# Patient Record
Sex: Female | Born: 1945 | Race: Black or African American | Hispanic: No | Marital: Single | Smoking: Never smoker
Health system: Southern US, Community
[De-identification: ages and names within clinical notes are randomized; demographics above are authoritative.]

## PROBLEM LIST (undated history)

## (undated) DIAGNOSIS — I2102 ST elevation (STEMI) myocardial infarction involving left anterior descending coronary artery: Secondary | ICD-10-CM

## (undated) DIAGNOSIS — I480 Paroxysmal atrial fibrillation: Secondary | ICD-10-CM

## (undated) DIAGNOSIS — I639 Cerebral infarction, unspecified: Secondary | ICD-10-CM

## (undated) DIAGNOSIS — E119 Type 2 diabetes mellitus without complications: Secondary | ICD-10-CM

## (undated) DIAGNOSIS — I25119 Atherosclerotic heart disease of native coronary artery with unspecified angina pectoris: Secondary | ICD-10-CM

## (undated) DIAGNOSIS — Z955 Presence of coronary angioplasty implant and graft: Secondary | ICD-10-CM

## (undated) HISTORY — DX: Cerebral infarction, unspecified: I63.9

## (undated) HISTORY — DX: ST elevation (STEMI) myocardial infarction involving left anterior descending coronary artery: I21.02

---

## 2014-06-07 ENCOUNTER — Emergency Department (INDEPENDENT_AMBULATORY_CARE_PROVIDER_SITE_OTHER)
Admission: EM | Admit: 2014-06-07 | Discharge: 2014-06-07 | Disposition: A | Discharge status: Home / Self Care | Payer: Medicare Other | Source: Home / Self Care | Attending: Family Medicine | Admitting: Family Medicine

## 2014-06-07 ENCOUNTER — Encounter (HOSPITAL_COMMUNITY): Payer: Self-pay | Admitting: Emergency Medicine

## 2014-06-07 DIAGNOSIS — I10 Essential (primary) hypertension: Secondary | ICD-10-CM

## 2014-06-07 DIAGNOSIS — G44209 Tension-type headache, unspecified, not intractable: Secondary | ICD-10-CM

## 2014-06-07 HISTORY — DX: Type 2 diabetes mellitus without complications: E11.9

## 2014-06-07 MED ORDER — IBUPROFEN 800 MG PO TABS
800.0000 mg | ORAL_TABLET | Freq: Once | ORAL | Status: AC
  Administered 2014-06-07: 800 mg via ORAL

## 2014-06-07 MED ORDER — IBUPROFEN 800 MG PO TABS
ORAL_TABLET | ORAL | Status: AC
  Filled 2014-06-07: qty 1

## 2014-06-07 MED ORDER — IBUPROFEN 800 MG PO TABS: 800.0000 mg | ORAL_TABLET | Freq: Three times a day (TID) | ORAL | Status: DC | PRN

## 2014-06-07 NOTE — ED Notes (Signed)
Patient checks blood pressure every morning, checked bp this am and noted it to be higher than usual.  Patient also reports waking with a headache

## 2014-06-07 NOTE — Discharge Instructions (Signed)
Tension Headache A tension headache is a feeling of pain, pressure, or aching often felt over the front and sides of the head. The pain can be dull or can feel tight (constricting). It is the most common type of headache. Tension headaches are not normally associated with nausea or vomiting and do not get worse with physical activity. Tension headaches can last 30 minutes to several days.  CAUSES  The exact cause is not known, but it may be caused by chemicals and hormones in the brain that lead to pain. Tension headaches often begin after stress, anxiety, or depression. Other triggers may include:  Alcohol.  Caffeine (too much or withdrawal).  Respiratory infections (colds, flu, sinus infections).  Dental problems or teeth clenching.  Fatigue.  Holding your head and neck in one position too long while using a computer. SYMPTOMS   Pressure around the head.   Dull, aching head pain.   Pain felt over the front and sides of the head.   Tenderness in the muscles of the head, neck, and shoulders. DIAGNOSIS  A tension headache is often diagnosed based on:   Symptoms.   Physical examination.   A CT scan or MRI of your head. These tests may be ordered if symptoms are severe or unusual. TREATMENT  Medicines may be given to help relieve symptoms.  HOME CARE INSTRUCTIONS   Only take over-the-counter or prescription medicines for pain or discomfort as directed by your caregiver.   Lie down in a dark, quiet room when you have a headache.   Keep a journal to find out what may be triggering your headaches. For example, write down:  What you eat and drink.  How much sleep you get.  Any change to your diet or medicines.  Try massage or other relaxation techniques.   Ice packs or heat applied to the head and neck can be used. Use these 3 to 4 times per day for 15 to 20 minutes each time, or as needed.   Limit stress.   Sit up straight, and do not tense your muscles.    Quit smoking if you smoke.  Limit alcohol use.  Decrease the amount of caffeine you drink, or stop drinking caffeine.  Eat and exercise regularly.  Get 7 to 9 hours of sleep, or as recommended by your caregiver.  Avoid excessive use of pain medicine as recurrent headaches can occur.  SEEK MEDICAL CARE IF:   You have problems with the medicines you were prescribed.  Your medicines do not work.  You have a change from the usual headache.  You have nausea or vomiting. SEEK IMMEDIATE MEDICAL CARE IF:   Your headache becomes severe.  You have a fever.  You have a stiff neck.  You have loss of vision.  You have muscular weakness or loss of muscle control.  You lose your balance or have trouble walking.  You feel faint or pass out.  You have severe symptoms that are different from your first symptoms. MAKE SURE YOU:   Understand these instructions.  Will watch your condition.  Will get help right away if you are not doing well or get worse. Document Released: 11/25/2005 Document Revised: 02/17/2012 Document Reviewed: 11/15/2011 [facility] Patient Information 2015 [place], [place]. This information is not intended to replace advice given to you by your health care provider. Make sure you discuss any questions you have with your health care provider.  Hypertension Hypertension, commonly called high blood pressure, is when the force of  blood pumping through your arteries is too strong. Your arteries are the blood vessels that carry blood from your heart throughout your body. A blood pressure reading consists of a higher number over a lower number, such as 110/72. The higher number (systolic) is the pressure inside your arteries when your heart pumps. The lower number (diastolic) is the pressure inside your arteries when your heart relaxes. Ideally you want your blood pressure below 120/80. Hypertension forces your heart to work harder to pump blood. Your arteries may become  narrow or stiff. Having hypertension puts you at risk for heart disease, stroke, and other problems.  RISK FACTORS Some risk factors for high blood pressure are controllable. Others are not.  Risk factors you cannot control include:   Race. You may be at higher risk if you are African American.  Age. Risk increases with age.  Gender. Men are at higher risk than women before age 98 years. After age 95, women are at higher risk than men. Risk factors you can control include:  Not getting enough exercise or physical activity.  Being overweight.  Getting too much fat, sugar, calories, or salt in your diet.  Drinking too much alcohol. SIGNS AND SYMPTOMS Hypertension does not usually cause signs or symptoms. Extremely high blood pressure (hypertensive crisis) may cause headache, anxiety, shortness of breath, and nosebleed. DIAGNOSIS  To check if you have hypertension, your health care provider will measure your blood pressure while you are seated, with your arm held at the level of your heart. It should be measured at least twice using the same arm. Certain conditions can cause a difference in blood pressure between your right and left arms. A blood pressure reading that is higher than normal on one occasion does not mean that you need treatment. If one blood pressure reading is high, ask your health care provider about having it checked again. TREATMENT  Treating high blood pressure includes making lifestyle changes and possibly taking medication. Living a healthy lifestyle can help lower high blood pressure. You may need to change some of your habits. Lifestyle changes may include:  Following the DASH diet. This diet is high in fruits, vegetables, and whole grains. It is low in salt, red meat, and added sugars.  Getting at least 2 1/2 hours of brisk physical activity every week.  Losing weight if necessary.  Not smoking.  Limiting alcoholic beverages.  Learning ways to reduce  stress. If lifestyle changes are not enough to get your blood pressure under control, your health care provider may prescribe medicine. You may need to take more than one. Work closely with your health care provider to understand the risks and benefits. HOME CARE INSTRUCTIONS  Have your blood pressure rechecked as directed by your health care provider.   Only take medicine as directed by your health care provider. Follow the directions carefully. Blood pressure medicines must be taken as prescribed. The medicine does not work as well when you skip doses. Skipping doses also puts you at risk for problems.   Do not smoke.   Monitor your blood pressure at home as directed by your health care provider. SEEK MEDICAL CARE IF:   You think you are having a reaction to medicines taken.  You have recurrent headaches or feel dizzy.  You have swelling in your ankles.  You have trouble with your vision. SEEK IMMEDIATE MEDICAL CARE IF:  You develop a severe headache or confusion.  You have unusual weakness, numbness, or feel faint.  You have severe chest or abdominal pain.  You vomit repeatedly.  You have trouble breathing. MAKE SURE YOU:   Understand these instructions.  Will watch your condition.  Will get help right away if you are not doing well or get worse. Document Released: 11/25/2005 Document Revised: 11/30/2013 Document Reviewed: 09/17/2013 [facility] Patient Information 2015 [place], [place]. This information is not intended to replace advice given to you by your health care provider. Make sure you discuss any questions you have with your health care provider.

## 2014-06-07 NOTE — ED Provider Notes (Signed)
Medical screening examination/treatment/procedure(s) were performed by resident physician or non-physician practitioner and as supervising physician I was immediately available for consultation/collaboration.   Barkley Bruns MD.   Linna Hoff, MD 06/07/14 [phone]

## 2014-06-07 NOTE — ED Provider Notes (Signed)
CSN: [id number]     Arrival date & time 06/07/14  1402 History   First MD Initiated Contact with Patient 06/07/14 1419     Chief Complaint  Patient presents with  . Hypertension  . Headache   (Consider location/radiation/quality/duration/timing/severity/associated sxs/prior Treatment) HPI Comments: 68 year old female presents for evaluation of headache and high blood pressure. She woke up this morning with a headache and a band across her forehead. She says it is 7-8/10 in severity. She checked her blood pressure at home two times and both times it was 177/105, one hour apart. At that point she decided to come in. She denies chest pain or shortness of breath. She denies nausea, vomiting, blurry vision, double vision, ringing in her ears. She denies any injury. She is compliant with her blood pressure medications. She says she normally takes 800 mg ibuprofen for these headaches which works well but she has run out of it.  Patient is a 68 y.o. female presenting with hypertension and headaches.  Hypertension Associated symptoms include headaches.  Headache   Past Medical History  Diagnosis Date  . Hypertension   . Diabetes mellitus without complication   . High cholesterol    History reviewed. No pertinent past surgical history. No family history on file. History  Substance Use Topics  . Smoking status: Never Smoker   . Smokeless tobacco: Not on file  . Alcohol Use: No   OB History   Grav Para Term Preterm Abortions TAB SAB Ect Mult Living                 Review of Systems  Cardiovascular:       High blood pressure reading at home   Neurological: Positive for headaches.  All other systems reviewed and are negative.   Allergies  Review of patient's allergies indicates no known allergies.  Home Medications   Prior to Admission medications   Medication Sig Start Date End Date Taking? Authorizing Provider  HYDROCHLOROTHIAZIDE PO Take by mouth.   Yes Historical Provider, MD   insulin aspart (NOVOLOG) 100 unit/mL injection Inject 10 Units into the skin 3 (three) times daily before meals.   Yes Historical Provider, MD  METFORMIN HCL PO Take by mouth.   Yes Historical Provider, MD  OVER THE COUNTER MEDICATION Patient reports 2 other medicines, but does not know the name of medicines   Yes Historical Provider, MD  SitaGLIPtin Phosphate (JANUVIA PO) Take by mouth.   Yes Historical Provider, MD  ibuprofen (ADVIL,MOTRIN) 800 MG tablet Take 1 tablet (800 mg total) by mouth every 8 (eight) hours as needed for headache. 06/07/14   Graylon Good, PA-C   BP 129/82  Pulse 84  Temp(Src) 98.4 F (36.9 C) (Oral)  Resp 12  SpO2 96% Physical Exam  Nursing note and vitals reviewed. Constitutional: She is oriented to person, place, and time. Vital signs are normal. She appears well-developed and well-nourished. No distress.  HENT:  Head: Normocephalic and atraumatic.  Nose: Nose normal.  Eyes: Conjunctivae are normal. Right eye exhibits no discharge. Left eye exhibits no discharge.  Neck: Normal range of motion. Neck supple.  Cardiovascular: Normal rate, regular rhythm and normal heart sounds.   Pulmonary/Chest: Effort normal and breath sounds normal. No respiratory distress.  Neurological: She is alert and oriented to person, place, and time. She has normal strength and normal reflexes. No cranial nerve deficit or sensory deficit. She exhibits normal muscle tone. She displays a negative Romberg sign. Coordination and gait normal. GCS  eye subscore is 4. GCS verbal subscore is 5. GCS motor subscore is 6.  Skin: Skin is warm and dry. No rash noted. She is not diaphoretic.  Psychiatric: She has a normal mood and affect. Judgment normal.    ED Course  Procedures (including critical care time) Labs Review Labs Reviewed - No data to display  Imaging Review No results found.   MDM   1. Tension headache   2. Essential hypertension    Blood pressure is normal. Neurologic  exam is normal. Given 800 mg ibuprofen here as well as a prescription to pick up again she needs some more. Followup with primary care.  New Prescriptions   IBUPROFEN (ADVIL,MOTRIN) 800 MG TABLET    Take 1 tablet (800 mg total) by mouth every 8 (eight) hours as needed for headache.       Graylon GoodZachary H Baker, PA-C 06/07/14 1442

## 2017-07-09 DIAGNOSIS — I639 Cerebral infarction, unspecified: Secondary | ICD-10-CM

## 2017-07-09 HISTORY — DX: Cerebral infarction, unspecified: I63.9

## 2017-07-19 ENCOUNTER — Emergency Department (HOSPITAL_COMMUNITY): Payer: Medicare (Managed Care)

## 2017-07-19 ENCOUNTER — Encounter (HOSPITAL_COMMUNITY): Payer: Self-pay | Admitting: Emergency Medicine

## 2017-07-19 ENCOUNTER — Inpatient Hospital Stay (HOSPITAL_COMMUNITY)
Admission: EM | Admit: 2017-07-19 | Discharge: 2017-07-22 | DRG: 066 | Disposition: A | Discharge status: Home Health | Payer: Medicare (Managed Care) | Attending: Family Medicine | Admitting: Family Medicine

## 2017-07-19 ENCOUNTER — Observation Stay (HOSPITAL_COMMUNITY): Payer: Medicare (Managed Care)

## 2017-07-19 DIAGNOSIS — R29716 NIHSS score 16: Secondary | ICD-10-CM | POA: Diagnosis present

## 2017-07-19 DIAGNOSIS — Z8673 Personal history of transient ischemic attack (TIA), and cerebral infarction without residual deficits: Secondary | ICD-10-CM

## 2017-07-19 DIAGNOSIS — R32 Unspecified urinary incontinence: Secondary | ICD-10-CM | POA: Diagnosis present

## 2017-07-19 DIAGNOSIS — I63311 Cerebral infarction due to thrombosis of right middle cerebral artery: Secondary | ICD-10-CM

## 2017-07-19 DIAGNOSIS — Z794 Long term (current) use of insulin: Secondary | ICD-10-CM

## 2017-07-19 DIAGNOSIS — R200 Anesthesia of skin: Secondary | ICD-10-CM | POA: Diagnosis present

## 2017-07-19 DIAGNOSIS — I959 Hypotension, unspecified: Secondary | ICD-10-CM | POA: Diagnosis not present

## 2017-07-19 DIAGNOSIS — E785 Hyperlipidemia, unspecified: Secondary | ICD-10-CM | POA: Diagnosis present

## 2017-07-19 DIAGNOSIS — Z79899 Other long term (current) drug therapy: Secondary | ICD-10-CM

## 2017-07-19 DIAGNOSIS — E1165 Type 2 diabetes mellitus with hyperglycemia: Secondary | ICD-10-CM | POA: Diagnosis present

## 2017-07-19 DIAGNOSIS — I951 Orthostatic hypotension: Secondary | ICD-10-CM | POA: Diagnosis present

## 2017-07-19 DIAGNOSIS — I10 Essential (primary) hypertension: Secondary | ICD-10-CM | POA: Diagnosis present

## 2017-07-19 DIAGNOSIS — E860 Dehydration: Secondary | ICD-10-CM | POA: Diagnosis not present

## 2017-07-19 DIAGNOSIS — R262 Difficulty in walking, not elsewhere classified: Secondary | ICD-10-CM | POA: Diagnosis present

## 2017-07-19 DIAGNOSIS — R29810 Facial weakness: Secondary | ICD-10-CM | POA: Diagnosis present

## 2017-07-19 DIAGNOSIS — I672 Cerebral atherosclerosis: Secondary | ICD-10-CM | POA: Diagnosis present

## 2017-07-19 DIAGNOSIS — R531 Weakness: Secondary | ICD-10-CM | POA: Diagnosis not present

## 2017-07-19 DIAGNOSIS — Z6833 Body mass index (BMI) 33.0-33.9, adult: Secondary | ICD-10-CM

## 2017-07-19 DIAGNOSIS — E669 Obesity, unspecified: Secondary | ICD-10-CM | POA: Diagnosis present

## 2017-07-19 DIAGNOSIS — I639 Cerebral infarction, unspecified: Principal | ICD-10-CM | POA: Diagnosis present

## 2017-07-19 DIAGNOSIS — I6522 Occlusion and stenosis of left carotid artery: Secondary | ICD-10-CM | POA: Diagnosis present

## 2017-07-19 DIAGNOSIS — E782 Mixed hyperlipidemia: Secondary | ICD-10-CM

## 2017-07-19 DIAGNOSIS — Z7984 Long term (current) use of oral hypoglycemic drugs: Secondary | ICD-10-CM

## 2017-07-19 DIAGNOSIS — E118 Type 2 diabetes mellitus with unspecified complications: Secondary | ICD-10-CM | POA: Diagnosis present

## 2017-07-19 DIAGNOSIS — E0859 Diabetes mellitus due to underlying condition with other circulatory complications: Secondary | ICD-10-CM

## 2017-07-19 LAB — URINALYSIS, ROUTINE W REFLEX MICROSCOPIC
Bacteria, UA: NONE SEEN
Bilirubin Urine: NEGATIVE
Glucose, UA: NEGATIVE mg/dL
Hgb urine dipstick: NEGATIVE
Ketones, ur: NEGATIVE mg/dL
Nitrite: NEGATIVE
PH: 5 (ref 5.0–8.0)
Protein, ur: NEGATIVE mg/dL
SPECIFIC GRAVITY, URINE: 1.018 (ref 1.005–1.030)

## 2017-07-19 LAB — I-STAT TROPONIN, ED: Troponin i, poc: 0 ng/mL (ref 0.00–0.08)

## 2017-07-19 LAB — CBC
HCT: 36.8 % (ref 36.0–46.0)
Hemoglobin: 12.1 g/dL (ref 12.0–15.0)
MCH: 30.3 pg (ref 26.0–34.0)
MCHC: 32.9 g/dL (ref 30.0–36.0)
MCV: 92 fL (ref 78.0–100.0)
Platelets: 179 10*3/uL (ref 150–400)
RBC: 4 MIL/uL (ref 3.87–5.11)
RDW: 13.3 % (ref 11.5–15.5)
WBC: 9.4 10*3/uL (ref 4.0–10.5)

## 2017-07-19 LAB — TSH: TSH: 1.389 u[IU]/mL (ref 0.350–4.500)

## 2017-07-19 LAB — GLUCOSE, CAPILLARY
GLUCOSE-CAPILLARY: 281 mg/dL — AB (ref 65–99)
Glucose-Capillary: 264 mg/dL — ABNORMAL HIGH (ref 65–99)

## 2017-07-19 LAB — COMPREHENSIVE METABOLIC PANEL
ALBUMIN: 3.4 g/dL — AB (ref 3.5–5.0)
ALT: 12 U/L — ABNORMAL LOW (ref 14–54)
AST: 30 U/L (ref 15–41)
Alkaline Phosphatase: 74 U/L (ref 38–126)
Anion gap: 9 (ref 5–15)
BUN: 19 mg/dL (ref 6–20)
CO2: 28 mmol/L (ref 22–32)
Calcium: 9.1 mg/dL (ref 8.9–10.3)
Chloride: 99 mmol/L — ABNORMAL LOW (ref 101–111)
Creatinine, Ser: 1.23 mg/dL — ABNORMAL HIGH (ref 0.44–1.00)
GFR calc Af Amer: 50 mL/min — ABNORMAL LOW (ref >60)
GFR calc non Af Amer: 43 mL/min — ABNORMAL LOW (ref >60)
GLUCOSE: 200 mg/dL — AB (ref 65–99)
Potassium: 4.5 mmol/L (ref 3.5–5.1)
SODIUM: 136 mmol/L (ref 135–145)
Total Bilirubin: 0.8 mg/dL (ref 0.3–1.2)
Total Protein: 7.3 g/dL (ref 6.5–8.1)

## 2017-07-19 LAB — DIFFERENTIAL
BASOS ABS: 0 10*3/uL (ref 0.0–0.1)
Basophils Relative: 0 %
EOS ABS: 0.4 10*3/uL (ref 0.0–0.7)
EOS PCT: 6 %
Lymphocytes Relative: 48 %
Lymphs Abs: 3.3 10*3/uL (ref 0.7–4.0)
MONOS PCT: 8 %
Monocytes Absolute: 0.6 10*3/uL (ref 0.1–1.0)
NEUTROS PCT: 38 %
Neutro Abs: 2.6 10*3/uL (ref 1.7–7.7)

## 2017-07-19 LAB — I-STAT CG4 LACTIC ACID, ED
LACTIC ACID, VENOUS: 1.85 mmol/L (ref 0.5–1.9)
Lactic Acid, Venous: 1.12 mmol/L (ref 0.5–1.9)

## 2017-07-19 LAB — CBG MONITORING, ED
GLUCOSE-CAPILLARY: 191 mg/dL — AB (ref 65–99)
GLUCOSE-CAPILLARY: 114 mg/dL — AB (ref 65–99)

## 2017-07-19 LAB — AMMONIA: Ammonia: 26 umol/L (ref 9–35)

## 2017-07-19 MED ORDER — SODIUM CHLORIDE 0.9 % IV SOLN
INTRAVENOUS | Status: DC
  Administered 2017-07-19 – 2017-07-20 (×3): via INTRAVENOUS

## 2017-07-19 MED ORDER — INSULIN ASPART 100 UNIT/ML ~~LOC~~ SOLN
0.0000 [IU] | Freq: Every day | SUBCUTANEOUS | Status: DC
  Administered 2017-07-19 – 2017-07-21 (×3): 3 [IU] via SUBCUTANEOUS

## 2017-07-19 MED ORDER — INSULIN ASPART 100 UNIT/ML ~~LOC~~ SOLN
0.0000 [IU] | Freq: Three times a day (TID) | SUBCUTANEOUS | Status: DC
  Administered 2017-07-19: 5 [IU] via SUBCUTANEOUS
  Administered 2017-07-20: 3 [IU] via SUBCUTANEOUS
  Administered 2017-07-20: 2 [IU] via SUBCUTANEOUS
  Administered 2017-07-20: 5 [IU] via SUBCUTANEOUS
  Administered 2017-07-21: 3 [IU] via SUBCUTANEOUS
  Administered 2017-07-21 (×2): 5 [IU] via SUBCUTANEOUS
  Administered 2017-07-22: 2 [IU] via SUBCUTANEOUS
  Administered 2017-07-22: 5 [IU] via SUBCUTANEOUS

## 2017-07-19 MED ORDER — ENOXAPARIN SODIUM 40 MG/0.4ML ~~LOC~~ SOLN
40.0000 mg | SUBCUTANEOUS | Status: DC
  Administered 2017-07-19 – 2017-07-21 (×3): 40 mg via SUBCUTANEOUS
  Filled 2017-07-19 (×3): qty 0.4

## 2017-07-19 MED ORDER — SODIUM CHLORIDE 0.9 % IV BOLUS (SEPSIS)
1000.0000 mL | Freq: Once | INTRAVENOUS | Status: AC
  Administered 2017-07-19: 1000 mL via INTRAVENOUS

## 2017-07-19 NOTE — ED Notes (Signed)
Patient walked with walker in the room. Ambulatory. Reported dizziness.

## 2017-07-19 NOTE — H&P (Signed)
History and Physical    Carrie Warner ZOX:[id number] DOB: 1946-07-10 DOA: 07/19/2017  PCP: System, Provider Not In Patient coming from: Home  I have personally briefly reviewed patient's old medical records in [facility]  Chief Complaint: difficult to arouse today , unsteady on her feet since 2 days.   HPI: Carrie Warner is a 71 y.o. female with medical history significant of  Hyperlipidemia, diabetes mellitus, hypertension, is from [place], is visiting her daughter here , has been living in [place] for the last 2 to 3 months, apparently became difficult to arouse over the last 2 to3  Days, unsteady on her feet , some drooling from the mouth occasionally,. On arrival to ED, pt was hypotensive with bp"s in 70-/40's , difficult to arouse,. She was given fluids and her BP improved, but when she tried to get out of bed, she became dizzy and her orthostatics were positive. She denies any fever or chills. She denies any chest pain, sob or cough. No abdominal pain, nausea or vomiting. Daughter at bedside reports that she has been unsteady on her feet and has had few episodes of unresponsiveness in the past. 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  She was referred to medical service for admission for the evaluation of the above.    Review of Systems: As per HPI otherwise 10 point review of systems negative.    Past Medical History:  Diagnosis Date  . Diabetes mellitus without complication (HCC)   . High cholesterol   . Hypertension     History reviewed. No pertinent surgical history.   reports that she has never smoked. She does not have any smokeless tobacco history on file. She reports that she does not drink alcohol or use drugs.  No Known Allergies  No family history on file. Pt is a poor historian ,  and she doesn't remember her family history.     Prior to Admission medications   Medication Sig Start Date End Date Taking? Authorizing Provider  hydrochlorothiazide (HYDRODIURIL) 25 MG tablet Take 25 mg by mouth daily. 04/11/17  Yes [provider]  insulin aspart (NOVOLOG) 100 unit/mL injection Inject 40 Units into the skin daily.    Yes [provider]  LANTUS SOLOSTAR 100 UNIT/ML Solostar Pen Inject 50 Units into the skin daily.  04/23/17  Yes [provider]  metFORMIN (GLUCOPHAGE) 1000 MG tablet Take 1,000 mg by mouth 2 (two) times daily. 04/21/17  Yes [provider]  ibuprofen (ADVIL,MOTRIN) 800 MG tablet Take 1 tablet (800 mg total) by mouth every 8 (eight) hours as needed for headache. Patient not taking: Reported on 07/19/2017 06/07/14   Graylon Good, PA-C    Physical Exam: Vitals:   07/19/17 0927 07/19/17 1000 07/19/17 1030 07/19/17 1100  BP: (!) 126/96 (!) 117/101 136/76 (!) 136/97  Pulse: 72 71 65 65  Resp: 17 19 12  (!) 21  Temp:      TempSrc:      SpO2: 94% 96% (!) 85% 95%  Weight:      Height:        Constitutional: lethargic, was responding to questions  Vitals:   07/19/17 0927 07/19/17 1000 07/19/17 1030 07/19/17 1100  BP: (!) 126/96 (!) 117/101 136/76 (!) 136/97  Pulse: 72 71 65 65  Resp: 17 19 12  (!) 21  Temp:  TempSrc:      SpO2: 94% 96% (!) 85% 95%  Weight:      Height:       Eyes: PERRL, lids and conjunctivae normal ENMT: Mucous membranes are dry.  Neck: normal, supple, no masses, no thyromegaly Respiratory: diminished at bases, but fair air entry. No wheezing or rhonchi.  Cardiovascular: Regular rate and rhythm, no murmurs / rubs / gallops. No extremity edema. 2+ pedal pulses. No carotid bruits.  Abdomen: no tenderness, no masses palpated. No hepatosplenomegaly. Bowel sounds positive.  Musculoskeletal: no clubbing / cyanosis. No joint deformity upper and lower extremities. Good ROM, no contractures. Normal  muscle tone.  Skin: no rashes, lesions, ulcers. No induration Neurologic: lethargicc, speech slow.  Psychiatric: Normal judgment and insight. Alert and oriented x 3. Normal mood.     Labs on Admission: I have personally reviewed following labs and imaging studies  CBC:  Recent Labs Lab 07/19/17 0237  WBC 9.4  HGB 12.1  HCT 36.8  MCV 92.0  PLT 179   Basic Metabolic Panel:  Recent Labs Lab 07/19/17 0237  NA 136  K 4.5  CL 99*  CO2 28  GLUCOSE 200*  BUN 19  CREATININE 1.23*  CALCIUM 9.1   GFR: Estimated Creatinine Clearance: 42.4 mL/min (A) (by C-G formula based on SCr of 1.23 mg/dL (H)). Liver Function Tests:  Recent Labs Lab 07/19/17 0237  AST 30  ALT 12*  ALKPHOS 74  BILITOT 0.8  PROT 7.3  ALBUMIN 3.4*   No results for input(s): LIPASE, AMYLASE in the last 168 hours.  Recent Labs Lab 07/19/17 0330  AMMONIA 26   Coagulation Profile: No results for input(s): INR, PROTIME in the last 168 hours. Cardiac Enzymes: No results for input(s): CKTOTAL, CKMB, CKMBINDEX, TROPONINI in the last 168 hours. BNP (last 3 results) No results for input(s): PROBNP in the last 8760 hours. HbA1C: No results for input(s): HGBA1C in the last 72 hours. CBG:  Recent Labs Lab 07/19/17 0220  GLUCAP 191*   Lipid Profile: No results for input(s): CHOL, HDL, LDLCALC, TRIG, CHOLHDL, LDLDIRECT in the last 72 hours. Thyroid Function Tests: No results for input(s): TSH, T4TOTAL, FREET4, T3FREE, THYROIDAB in the last 72 hours. Anemia Panel: No results for input(s): VITAMINB12, FOLATE, FERRITIN, TIBC, IRON, RETICCTPCT in the last 72 hours. Urine analysis:    Component Value Date/Time   COLORURINE YELLOW 07/19/2017 0420   APPEARANCEUR HAZY (A) 07/19/2017 0420   LABSPEC 1.018 07/19/2017 0420   PHURINE 5.0 07/19/2017 0420   GLUCOSEU NEGATIVE 07/19/2017 0420   HGBUR NEGATIVE 07/19/2017 0420   BILIRUBINUR NEGATIVE 07/19/2017 0420   KETONESUR NEGATIVE 07/19/2017 0420    PROTEINUR NEGATIVE 07/19/2017 0420   NITRITE NEGATIVE 07/19/2017 0420   LEUKOCYTESUR TRACE (A) 07/19/2017 0420    Radiological Exams on Admission: Dg Chest 1 View  Result Date: 07/19/2017 CLINICAL DATA:  Acute onset of generalized weakness. Confusion. Initial encounter. EXAM: CHEST 1 VIEW COMPARISON:  None. FINDINGS: The lungs are hypoexpanded but appear grossly clear. There is no evidence of focal opacification, pleural effusion or pneumothorax. The cardiomediastinal silhouette is within normal limits. No acute osseous abnormalities are seen. IMPRESSION: Lungs hypoexpanded but grossly clear. Electronically Signed   By: Roanna Raider M.D.   On: 07/19/2017 03:25   Ct Head Wo Contrast  Result Date: 07/19/2017 CLINICAL DATA:  Altered level of consciousness unexplained. EXAM: CT HEAD WITHOUT CONTRAST TECHNIQUE: Contiguous axial images were obtained from the base of the skull through the vertex without intravenous contrast.  COMPARISON:  None. 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 The skullbase. No large vascular territory infarct, intracranial hemorrhage, midline shift or edema. No intra-axial mass or extra-axial fluid collections are apparent. Vascular: Moderate atherosclerosis of the carotid siphons. No hyperdense vessels. Skull: Negative for fracture. Sinuses/Orbits: No acute finding. Other: None. 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 No acute intracranial abnormality is identified. Electronically Signed   By: Tollie Eth M.D.   On: 07/19/2017 03:28    EKG: Independently reviewed. Sinus rhythm.   Assessment/Plan Active Problems:   Orthostatic  hypotension   Difficult to arouse, lethargy, unsteady on her feet, : ? Orthostatic hypotension. Differential include Dehydration vs infection vs stroke vs deconditioning vs hypoglycemia>  Stop all bp meds. Continue with IV fluids. No source of infection so far.  Get MRI brain without contrast for evaluation of stroke.  PT evaluation.    Hypertension;  Holding all bp meds.    Diabetes mellitus:  CBG (last 3)   Recent Labs  07/19/17 0220 07/19/17 1207  GLUCAP 191* 114*    Resume SSI, get hgba1c ordered.   Hyperlipidemia: get lipid panel in am.      DVT prophylaxis: lovenox.  Code Status:  Full code.  Family Communication: discussed with daughter at bedside.  Disposition Plan: pending further eval.  Consults called: none.  Admission status: obs.   Kathlen Mody MD [facility] Pager [phone]  If 7PM-7AM, please contact night-coverage [email or web address] Password [email or web address]  07/19/2017, 12:00 PM

## 2017-07-19 NOTE — ED Triage Notes (Signed)
Pt comes to ed, via family. Pulled up and was to weak to get out. Family had techs help bring pt to the room. Pt was obtunded and non weight bearing,  Pt would not open eyes, but able to answer questions with confusion.

## 2017-07-19 NOTE — ED Provider Notes (Signed)
[facility]-EMERGENCY DEPT Provider Note   CSN: [id number] Arrival date & time: 07/19/17  0213     History   Chief Complaint Chief Complaint  Patient presents with  . Weakness  . Altered Mental Status    HPI Carrie Warner is a 71 y.o. female.  The history is provided by the patient.  Weakness  Associated symptoms include altered mental status.  Altered Mental Status   Associated symptoms include weakness.  Her children brought her here because of progressive difficulty with walking and somnolence. She has been staying with them for the last 3 months. During that time, they noted that she was having difficulty with her gait and stumbling when walking. This has been getting progressively worse, and it was much more severe over the last 3 days. Over the last 3 days, they noticed that she has been falling asleep and difficult to arouse. There's been no fever or chills. She denies hurting anywhere. She denies nausea or vomiting. Family relates that they were at church 2 days ago, and it took 3 people to assist the patient out of the church.  Past Medical History:  Diagnosis Date  . Diabetes mellitus without complication (HCC)   . High cholesterol   . Hypertension     There are no active problems to display for this patient.   History reviewed. No pertinent surgical history.  OB History    No data available       Home Medications    Prior to Admission medications   Medication Sig Start Date End Date Taking? Authorizing Provider  HYDROCHLOROTHIAZIDE PO Take by mouth.    [provider]  ibuprofen (ADVIL,MOTRIN) 800 MG tablet Take 1 tablet (800 mg total) by mouth every 8 (eight) hours as needed for headache. 06/07/14   Excell Seltzer, Adrian Blackwater, PA-C  insulin aspart (NOVOLOG) 100 unit/mL injection Inject 10 Units into the skin 3 (three) times daily before meals.    [provider]  METFORMIN HCL PO Take by mouth.    [provider]  OVER THE COUNTER MEDICATION  Patient reports 2 other medicines, but does not know the name of medicines    [provider]  SitaGLIPtin Phosphate (JANUVIA PO) Take by mouth.    [provider]    Family History No family history on file.  Social History Social History  Substance Use Topics  . Smoking status: Never Smoker  . Smokeless tobacco: Not on file  . Alcohol use No     Allergies   Patient has no known allergies.   Review of Systems Review of Systems  Neurological: Positive for weakness.  All other systems reviewed and are negative.    Physical Exam Updated Vital Signs BP 112/89 (BP Location: Right Arm)   Pulse 95   Temp 98.6 F (37 C) (Oral)   Resp (!) 22   Ht 5\' 2"  (1.575 m)   Wt 87.1 kg (192 lb)   SpO2 95%   BMI 35.12 kg/m   Physical Exam  Nursing note and vitals reviewed.  71 year old female, resting comfortably and in no acute distress. Vital signs are significant for mild tachypnea. Oxygen saturation is 95%, which is normal. Head is normocephalic and atraumatic. PERRLA, EOMI. Oropharynx is clear. Neck is nontender and supple without adenopathy or JVD. Back is nontender and there is no CVA tenderness. Lungs are clear without rales, wheezes, or rhonchi. Chest is nontender. Heart has regular rate and rhythm without murmur. Abdomen is soft, flat, nontender  without masses or hepatosplenomegaly and peristalsis is normoactive. Extremities have no cyanosis or edema, full range of motion is present. Skin is warm and dry without rash. Neurologic: She is sleepy but easily arousable, oriented to person and place and time when awakened, cranial nerves are intact, there are no motor or sensory deficits.  Being moved from automobile to her room, she is reported to not support her weight at all.  ED Treatments / Results  Labs (all labs ordered are listed, but only abnormal results are displayed) Labs Reviewed  COMPREHENSIVE METABOLIC PANEL - Abnormal; Notable for the  following:       Result Value   Chloride 99 (*)    Glucose, Bld 200 (*)    Creatinine, Ser 1.23 (*)    Albumin 3.4 (*)    ALT 12 (*)    GFR calc non Af Amer 43 (*)    GFR calc Af Amer 50 (*)    All other components within normal limits  URINALYSIS, ROUTINE W REFLEX MICROSCOPIC - Abnormal; Notable for the following:    APPearance HAZY (*)    Leukocytes, UA TRACE (*)    Squamous Epithelial / LPF 0-5 (*)    All other components within normal limits  CBG MONITORING, ED - Abnormal; Notable for the following:    Glucose-Capillary 191 (*)    All other components within normal limits  CBC  AMMONIA  DIFFERENTIAL  CBG MONITORING, ED  I-STAT CG4 LACTIC ACID, ED  I-STAT TROPONIN, ED  I-STAT CG4 LACTIC ACID, ED    EKG  EKG Interpretation  Date/Time:  Saturday July 19 2017 02:30:57 EDT Ventricular Rate:  98 PR Interval:    QRS Duration: 88 QT Interval:  363 QTC Calculation: 464 R Axis:   -11 Text Interpretation:  Sinus rhythm Atrial premature complex Abnormal R-wave progression, early transition No old tracing to compare Confirmed by Dione Booze ([id number]) on 07/19/2017 3:06:18 AM       Radiology Dg Chest 1 View  Result Date: 07/19/2017 CLINICAL DATA:  Acute onset of generalized weakness. Confusion. Initial encounter. EXAM: CHEST 1 VIEW COMPARISON:  None. FINDINGS: The lungs are hypoexpanded but appear grossly clear. There is no evidence of focal opacification, pleural effusion or pneumothorax. The cardiomediastinal silhouette is within normal limits. No acute osseous abnormalities are seen. IMPRESSION: Lungs hypoexpanded but grossly clear. Electronically Signed   By: Roanna Raider M.D.   On: 07/19/2017 03:25   Ct Head Wo Contrast  Result Date: 07/19/2017 CLINICAL DATA:  Altered level of consciousness unexplained. EXAM: CT HEAD WITHOUT CONTRAST TECHNIQUE: Contiguous axial images were obtained from the base of the skull through the vertex without intravenous contrast. COMPARISON:   None. 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 The skullbase. No large vascular territory infarct, intracranial hemorrhage, midline shift or edema. No intra-axial mass or extra-axial fluid collections are apparent. Vascular: Moderate atherosclerosis of the carotid siphons. No hyperdense vessels. Skull: Negative for fracture. Sinuses/Orbits: No acute finding. Other: None. 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 No acute intracranial abnormality is identified. Electronically Signed   By: Tollie Eth M.D.   On: 07/19/2017 03:28    Procedures Procedures (including critical care time)  Medications Ordered in ED Medications - No data to display  Initial Impression / Assessment and Plan / ED Course  I have reviewed the triage vital signs and the nursing notes.  Pertinent labs & imaging results that were available during my care of the patient were reviewed by me and considered in my medical decision making (see chart for details).  Progressive difficulty with balance with new onset of somnolence. Cause for this is not clear. Screening labs and CT abdomen ordered. Old records are reviewed, and she has no relevant past visits.  ED workup is unremarkable. CT shows small vessel disease. Laboratory workup is unremarkable. Patient was noted to have be falling asleep, and became hypotensive. She was given IV fluids and lactic acid level was checked. Lactic acid level has come back normal. Blood pressures come up with a liter of saline. Will check patient's ability to ambulate, if unable to, will need to be admitted.  Final Clinical Impressions(s) / ED Diagnoses    Final diagnoses:  Weakness    New Prescriptions New Prescriptions   No medications on file     Dione Booze, MD 07/19/17 [phone]

## 2017-07-19 NOTE — ED Provider Notes (Signed)
Pt received at sign out. Pt brought in by family for lethargy, inability to stand/weakness, worse over the past 3 days. Pt apparently needed x3 assist to get out of the car and into the ED due to her symptoms. Pt BP's initially normal, but SBP dropped to 70's overnight. IVF bolus given with improvement. Despite IVF, pt continues orthostatic on VS, and also c/o "dizziness" when ambulating. Cr elevated, no old to compare. Will continue judicious IVF. Workup otherwise reassuring. Family concerned regarding d/c home. CM called: no criteria for home services at this time, may qualify for obs admit for IVF, recheck Cr in morning, PT consult; CM will speak with family. T/C to [facility]. Blake Divine, case discussed, including:  HPI, pertinent PM/SHx, VS/PE, dx testing, ED course and treatment, as well as d/w CM:  Agreeable to come to ED for evaluation to admit.    Samuel Jester, DO 07/19/17 1101

## 2017-07-19 NOTE — Progress Notes (Signed)
PT Cancellation Note  Patient Details Name: Bassheva Brasile MRN: [id number] DOB: 1946-05-23   Cancelled Treatment:    Reason Eval/Treat Not Completed: Medical issues which prohibited therapy (pt admitted today, remains hypotensive and MRI brain pending)   Tunya Held,KATHrine E 07/19/2017, 3:17 PM Zenovia Jarred, PT, DPT 07/19/2017 Pager: [phone]

## 2017-07-19 NOTE — Progress Notes (Signed)
RN can call report at 12:30 to [facility] at [phone].  Earnest Conroy. Clelia Croft, RN

## 2017-07-19 NOTE — Care Management Note (Addendum)
Case Management Note  Patient Details  Name: Carrie Warner MRN: [id number] Date of Birth: 08-02-1946  Subjective/Objective:      Hypotension, solmonence, address is [place], no PCP listed CVA              Action/Plan: Discharge Planning: NCM attempted call to speak to dtr, Arta Bruce # [phone]. Left message for dtr to return call.   07/19/2017 1100 NCM received call back from dtr, [name]. States pt has been here in [place] since May and the plan for now is for her to remain in [place]. States she see Dr. Julio Sicks and will try to establish pt with [facility] as PCP. Will continue to follow for dc needs. Pt has DME at home. Was independent PTA.   07/19/2017 1500 NCM did see Dr. Julio Sicks and the office has openings, dtr will need to call office and arrange appt.   07/20/2017 NCM spoke to dtr and pt at bedside. She will transfer to [facility] to Neuro Unit. Provided dtr with copy of her Medicare Card, states pt lost her wallet. PT/OT evaluation, SNF vs HH.   Expected Discharge Date:                 Expected Discharge Plan:  Home/Self Care  In-House Referral:  NA  Discharge planning Services  CM Consult  Post Acute Care Choice:  NA Choice offered to:  NA  DME Arranged:  N/A DME Agency:  NA  HH Arranged:  NA HH Agency:  NA  Status of Service:  In process, will continue to follow  If discussed at Long Length of Stay Meetings, dates discussed:    Additional Comments:  Elliot Cousin, RN 07/19/2017, 10:24 AM

## 2017-07-19 NOTE — ED Notes (Signed)
Patient transported to CT 

## 2017-07-19 NOTE — ED Notes (Signed)
Pt is overly weak, was not able to stand up x3 assist on arrival. Pt was carried out of her car by techs.

## 2017-07-20 ENCOUNTER — Encounter (HOSPITAL_COMMUNITY): Payer: Self-pay | Admitting: *Deleted

## 2017-07-20 ENCOUNTER — Inpatient Hospital Stay (HOSPITAL_COMMUNITY): Payer: Medicare (Managed Care)

## 2017-07-20 DIAGNOSIS — I672 Cerebral atherosclerosis: Secondary | ICD-10-CM | POA: Diagnosis present

## 2017-07-20 DIAGNOSIS — I951 Orthostatic hypotension: Secondary | ICD-10-CM | POA: Diagnosis present

## 2017-07-20 DIAGNOSIS — I63311 Cerebral infarction due to thrombosis of right middle cerebral artery: Secondary | ICD-10-CM | POA: Diagnosis not present

## 2017-07-20 DIAGNOSIS — I10 Essential (primary) hypertension: Secondary | ICD-10-CM | POA: Diagnosis present

## 2017-07-20 DIAGNOSIS — Z6833 Body mass index (BMI) 33.0-33.9, adult: Secondary | ICD-10-CM | POA: Diagnosis not present

## 2017-07-20 DIAGNOSIS — E669 Obesity, unspecified: Secondary | ICD-10-CM | POA: Diagnosis present

## 2017-07-20 DIAGNOSIS — R262 Difficulty in walking, not elsewhere classified: Secondary | ICD-10-CM | POA: Diagnosis present

## 2017-07-20 DIAGNOSIS — R29810 Facial weakness: Secondary | ICD-10-CM | POA: Diagnosis present

## 2017-07-20 DIAGNOSIS — Z8673 Personal history of transient ischemic attack (TIA), and cerebral infarction without residual deficits: Secondary | ICD-10-CM | POA: Diagnosis not present

## 2017-07-20 DIAGNOSIS — I340 Nonrheumatic mitral (valve) insufficiency: Secondary | ICD-10-CM | POA: Diagnosis not present

## 2017-07-20 DIAGNOSIS — I959 Hypotension, unspecified: Secondary | ICD-10-CM | POA: Diagnosis not present

## 2017-07-20 DIAGNOSIS — Z7984 Long term (current) use of oral hypoglycemic drugs: Secondary | ICD-10-CM | POA: Diagnosis not present

## 2017-07-20 DIAGNOSIS — E785 Hyperlipidemia, unspecified: Secondary | ICD-10-CM | POA: Diagnosis present

## 2017-07-20 DIAGNOSIS — Z794 Long term (current) use of insulin: Secondary | ICD-10-CM | POA: Diagnosis not present

## 2017-07-20 DIAGNOSIS — R29716 NIHSS score 16: Secondary | ICD-10-CM | POA: Diagnosis present

## 2017-07-20 DIAGNOSIS — I639 Cerebral infarction, unspecified: Principal | ICD-10-CM

## 2017-07-20 DIAGNOSIS — R200 Anesthesia of skin: Secondary | ICD-10-CM | POA: Diagnosis present

## 2017-07-20 DIAGNOSIS — E1165 Type 2 diabetes mellitus with hyperglycemia: Secondary | ICD-10-CM | POA: Diagnosis present

## 2017-07-20 DIAGNOSIS — E089 Diabetes mellitus due to underlying condition without complications: Secondary | ICD-10-CM | POA: Diagnosis not present

## 2017-07-20 DIAGNOSIS — Z79899 Other long term (current) drug therapy: Secondary | ICD-10-CM | POA: Diagnosis not present

## 2017-07-20 DIAGNOSIS — I6522 Occlusion and stenosis of left carotid artery: Secondary | ICD-10-CM | POA: Diagnosis present

## 2017-07-20 DIAGNOSIS — R32 Unspecified urinary incontinence: Secondary | ICD-10-CM | POA: Diagnosis present

## 2017-07-20 LAB — GLUCOSE, CAPILLARY
GLUCOSE-CAPILLARY: 176 mg/dL — AB (ref 65–99)
GLUCOSE-CAPILLARY: 290 mg/dL — AB (ref 65–99)
Glucose-Capillary: 234 mg/dL — ABNORMAL HIGH (ref 65–99)
Glucose-Capillary: 255 mg/dL — ABNORMAL HIGH (ref 65–99)

## 2017-07-20 MED ORDER — ASPIRIN EC 325 MG PO TBEC
325.0000 mg | DELAYED_RELEASE_TABLET | Freq: Every day | ORAL | Status: DC
  Administered 2017-07-20 – 2017-07-22 (×3): 325 mg via ORAL
  Filled 2017-07-20 (×3): qty 1

## 2017-07-20 MED ORDER — IOPAMIDOL (ISOVUE-370) INJECTION 76%
INTRAVENOUS | Status: AC
  Administered 2017-07-20: 50 mL
  Filled 2017-07-20: qty 50

## 2017-07-20 MED ORDER — INSULIN GLARGINE 100 UNIT/ML ~~LOC~~ SOLN
25.0000 [IU] | Freq: Every day | SUBCUTANEOUS | Status: DC
  Administered 2017-07-21 – 2017-07-22 (×2): 25 [IU] via SUBCUTANEOUS
  Filled 2017-07-20 (×3): qty 0.25

## 2017-07-20 MED ORDER — STROKE: EARLY STAGES OF RECOVERY BOOK: Freq: Once | Status: DC

## 2017-07-20 MED ORDER — ASPIRIN 325 MG PO TABS
325.0000 mg | ORAL_TABLET | Freq: Once | ORAL | Status: AC
  Administered 2017-07-20: 325 mg via ORAL
  Filled 2017-07-20: qty 1

## 2017-07-20 NOTE — Progress Notes (Signed)
PT Cancellation Note  Patient Details Name: Carrie Warner MRN: [id number] DOB: 1946/04/02   Cancelled Treatment:    Reason Eval/Treat Not Completed: Other (comment) (pt transferred to [facility]) Will likely check back tomorrow for evaluation.   Star Resler,KATHrine E 07/20/2017, 12:15 PM Zenovia Jarred, PT, DPT 07/20/2017 Pager: [phone]'

## 2017-07-20 NOTE — Progress Notes (Signed)
Pt off unit to CTA.

## 2017-07-20 NOTE — Consult Note (Signed)
Requesting Physician: Dr. Jarvis Newcomer    Chief Complaint: AMS, weakness  History obtained from:  Patient and Chart   HPI:                                                                                                                                      Carrie Warner is an 71 y.o. female with a past medical history that is significant for hypertension, hyperlipidemia and type two diabetes presents to [facility] with weakness and AMS on 07/19/17 with her daughter. She arrived to the ED following a several hour stent of unresponsiveness that began at a church in [place] and ended approximately with IVF. The patient's children noticed that she has had progressive trouble walking over the past three months with more acute worsening over the past 3-4 days.   On her arrival, Carrie Warner was hypotensive on arrival and difficult to rouse with a left facial droop and drooling. Per report, She had positive orthostatic vital signs despite judicious IVF. She has no recollection of the events that brought her here.  On my visit, she was sitting in bed. Facial droop and drooling had resolved. She was alert and oriented, able to name objects. Denies dizziness, light headedness, headache and any pain. She was eager to eat her lunch. Her daughter noted that, since being admitted, she occasionally gets light headed and asks to be laid down. She also states that her mother has been confused since getting here (mistaking events from today as those from last week).  MRI of the brain on 07/19/17 showed small areas of acute infarct in the right posterior limb internal capsule and right anterior thalamus, and neurology was called consulted for additional evaluation.  At baseline, Carrie Warner walks independently. She looks after her own affairs, bathes, cooks and cleans for herself.  Date last known well: Unable to determine Time last known well: Unable to determine  tPA Given: No: OOW  Modified Rankin Score: 1  Stroke Risk  Factors - diabetes mellitus, hyperlipidemia and hypertension   Past Medical History:  Diagnosis Date  . Diabetes mellitus without complication (HCC)   . High cholesterol   . Hypertension     History reviewed. No pertinent surgical history.  History reviewed. No pertinent family history.   reports that she has never smoked. She does not have any smokeless tobacco history on file. She reports that she does not drink alcohol or use drugs.  No Known Allergies  Medications:  Current Meds  Medication Sig  . hydrochlorothiazide (HYDRODIURIL) 25 MG tablet Take 25 mg by mouth daily.  . insulin aspart (NOVOLOG) 100 unit/mL injection Inject 40 Units into the skin daily.   Marland Kitchen LANTUS SOLOSTAR 100 UNIT/ML Solostar Pen Inject 50 Units into the skin daily.   . metFORMIN (GLUCOPHAGE) 1000 MG tablet Take 1,000 mg by mouth 2 (two) times daily.    Review Of Systems:                                                                                                           History obtained from the patient  General: Negative for chills, fever Psychological: Negative for any known behavioral disorder Ophthalmic: Negative for blurry or double vision, loss of vision ENT: Negative for abrupt loss of hearing, tinnitus or vertigo Respiratory: Negative for cough, shortness of breath Cardiovascular: Negative for chest pain Gastrointestinal: Negative for abdominal pain Musculoskeletal: Negative for joint swelling or muscular weakness Neurological: As noted in HPI Dermatological: Negative for rash or skin changes, numbness or tingling  Blood pressure (!) 156/71, pulse 79, temperature 98.5 F (36.9 C), temperature source Oral, resp. rate 18, height 5\' 4"  (1.626 m), weight 88.6 kg (195 lb 5.2 oz), SpO2 99 %.   Physical Examination:                                                                                                       General: WDWN female.  HEENT:  Normocephalic, no lesions, without obvious abnormality.  Normal external eye and conjunctiva. Arcus senilis. Normal external ears. Normal external nose, mucus membranes and septum.  Normal pharynx. Cardiovascular: regular rate and rhythm, pulses palpable throughout   Pulmonary: Unlabored breathing Abdomen: Soft Extremities: no joint deformities, effusion, or inflammation Musculoskeletal: Tone and bulk:normal tone throughout; no atrophy noted Skin: warm and dry, no hyperpigmentation, vitiligo, or suspicious lesions,   Neurological Examination:                                                                                               Mental Status: Carrie Warner is alert, oriented, thought content appropriate.  Speech is  fluent without evidence of aphasia. Able to follow 3-step commands without difficulty. Cranial Nerves: II: Visual fields grossly normal,  pupils are equal, round, reactive to light III,IV, VI: Ptosis not present, extra-ocular muscle movements intact bilaterally V,VII: Smile and eyebrow raise is symmetric. Facial light touch and pinprick sensation intact bilaterally VIII: Hearing intact to voice IX,X: Uvula and palate rise symmetrically XI: SCM and bilateral shoulder shrug strength 5/5 XII: Midline tongue extension Motor: Right :     Upper extremity   5/5   Left:     Upper extremity   5/5          Lower extremity   5/5    Lower extremity   5/5 Pronator drift not present Sensory: Pinprick and light touch intact throughout, bilaterally Deep Tendon Reflexes: 2+ and symmetric throughout BUE. Diminished in the BLE Plantars: Right: downgoing   Left: downgoing Cerebellar: Finger-to-nose test without evidence of dysmetria or ataxia. Heel-to-shin test executed within normal limits. Proprioception: Vibratory sense not intact Gait: Not tested  Lab Results: Basic Metabolic Panel:  Recent Labs Lab  07/19/17 0237  NA 136  K 4.5  CL 99*  CO2 28  GLUCOSE 200*  BUN 19  CREATININE 1.23*  CALCIUM 9.1    Liver Function Tests:  Recent Labs Lab 07/19/17 0237  AST 30  ALT 12*  ALKPHOS 74  BILITOT 0.8  PROT 7.3  ALBUMIN 3.4*   No results for input(s): LIPASE, AMYLASE in the last 168 hours.  Recent Labs Lab 07/19/17 0330  AMMONIA 26    CBC:  Recent Labs Lab 07/19/17 0237 07/19/17 1157  WBC 9.4  --   NEUTROABS  --  2.6  HGB 12.1  --   HCT 36.8  --   MCV 92.0  --   PLT 179  --     Cardiac Enzymes: No results for input(s): CKTOTAL, CKMB, CKMBINDEX, TROPONINI in the last 168 hours.  Lipid Panel: No results for input(s): CHOL, TRIG, HDL, CHOLHDL, VLDL, LDLCALC in the last 168 hours.  CBG:  Recent Labs Lab 07/19/17 1207 07/19/17 1807 07/19/17 2114 07/20/17 0726 07/20/17 1136  GLUCAP 114* 281* 264* 176* 234*    Microbiology: No results found for this or any previous visit.  Coagulation Studies: No results for input(s): LABPROT, INR in the last 72 hours.  Imaging: Dg Chest 1 View  Result Date: 07/19/2017 CLINICAL DATA:  Acute onset of generalized weakness. Confusion. Initial encounter. EXAM: CHEST 1 VIEW COMPARISON:  None. FINDINGS: The lungs are hypoexpanded but appear grossly clear. There is no evidence of focal opacification, pleural effusion or pneumothorax. The cardiomediastinal silhouette is within normal limits. No acute osseous abnormalities are seen. IMPRESSION: Lungs hypoexpanded but grossly clear. Electronically Signed   By: Roanna Raider M.D.   On: 07/19/2017 03:25   Ct Head Wo Contrast  Result Date: 07/19/2017 CLINICAL DATA:  Altered level of consciousness unexplained. EXAM: CT HEAD WITHOUT CONTRAST TECHNIQUE: Contiguous axial images were obtained from the base of the skull through the vertex without intravenous contrast. COMPARISON:  None. 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 The skullbase. No large vascular territory infarct, intracranial hemorrhage, midline shift or edema. No intra-axial mass or extra-axial fluid collections are apparent. Vascular: Moderate atherosclerosis of the carotid siphons. No hyperdense vessels. Skull: Negative for fracture. Sinuses/Orbits: No acute finding. Other: None. 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 No acute intracranial abnormality is identified. Electronically Signed   By: Tollie Eth M.D.   On: 07/19/2017 03:28   Mr Brain Wo Contrast  Result Date: 07/19/2017 CLINICAL DATA:  Altered level consciousness, unexplained. Hyperlipidemia, hypertension, diabetes EXAM: MRI HEAD WITHOUT CONTRAST TECHNIQUE: Multiplanar, multiecho pulse sequences of the brain and surrounding structures were obtained without intravenous contrast. COMPARISON:  CT head 07/19/2017 FINDINGS: Brain: Small areas of acute infarct in the right internal capsule posteriorly and anterior thalamus. No other areas of acute infarct. Mild chronic microvascular ischemic change in the white matter. Chronic infarct left cerebellum. Ventricle size normal. Negative for hemorrhage or mass Image quality degraded by motion. Vascular: Normal arterial flow voids. 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 Mild chronic microvascular ischemic change in the white matter. Electronically Signed   By: Marlan Palau M.D.   On: 07/19/2017 19:50     Assessment and plan per attending neurologist.  Leonel Ramsay PA-C [facility] [phone]  07/20/2017, 12:30 PM  I have seen the patient and reviewed the above note. She has mild left facial weakness and dysarthria.  Assessment  71 year old female who presented with decreased responsiveness, left-sided weakness. She is markedly improved. I wonder if she actually had a larger area of ischemic territory   Plan:  1. HgbA1c, fasting lipid panel 2. CTA head and neck.  3. Frequent neuro checks 4. Echocardiogram 5. Prophylactic therapy-Antiplatelet med: Aspirin - dose 325mg  PO or 300mg  PR 6. Risk factor modification 7. Telemetry monitoring 8. PT consult, OT consult, Speech consult 9. please page stroke NP  Or  PA  Or MD  from 8am -4 pm as this patient will be followed by the stroke team at this point.   You can look them up on [email or web address]     Ritta Slot, MD [facility] [phone]  If 7pm- 7am, please page neurology on call as listed in AMION.

## 2017-07-20 NOTE — Progress Notes (Signed)
PROGRESS NOTE  Carrie Warner  OFH:[id number] DOB: 05-Mar-1946 DOA: 07/19/2017 PCP: System, Provider Not In   Brief Narrative: Carrie Warner is a 71 y.o. female with a history of T2DM, HTN, HLD brought to the ED by her daughter for somnolence, left face droop and drooling, and inability to bear weight. On arrival she had to be carried into ED, was hypotensive (70s/40s), difficult to arouse. BPs improved with IVFs but remained orthostatic with significant symptoms, remained unable to bear weight. Neuro exam demonstrated slow speech but no focal deficits. CT head showed chronic microvascular disease, but subsequent MRI showed acute right ischemic infarcts in the posterior limb of the internal capsule and anterior thalamus.   Assessment & Plan: Principal Problem:   CVA (cerebral vascular accident) (HCC) Active Problems:   Orthostatic hypotension   Essential hypertension   Diabetes mellitus due to underlying condition (HCC)  Ischemic CVA: Involving right PLIC, anterior thalamus in setting of chronic CVD. Previous cerebellar infarct also noted.  - Discussed with neurology, Dr. Amada Jupiter who advised initiating stroke work up and transfer to [facility] for neurology evaluation. Will evaluate on arrival.  - Continue telemetry - Carotid U/S, echocardiogram ordered - PT/OT/SLP: Will make NPO and can restart carb-modified, heart healthy diet if passes screen.  - Will give aspirin 325mg , antiplatelet per neurology.  - Lipid panel, HbA1c ordered.  - Permissive HTN: Have stopped BP meds for hypotension.  Orthostatic hypotension:  - Holding BP medication (HCTZ) - Continue to monitor  IDT2DM:  - Check HbA1c as above - Hold metformin - SSI ordered. Holding home insulins for now  Hyperlipidemia: Not on medication - Check lipid panel  DVT prophylaxis: Lovenox Code Status: Full Family Communication: Daughter at bedside and daughter on phone Disposition Plan: Transfer to [facility] for CVA work up, neurology  evaluation.   Consultants:   Neurology, Dr. Amada Jupiter  Procedures:   Echocardiogram and carotid U/S ordered  Antimicrobials:  None   Subjective: Still feels lightheaded in bed, has not gotten up. Ate breakfast this morning. Denies specific numbness or focal areas of weakness. Daughter says facial droop is resolved and she's no longer been drooling.   Objective: BP 107/86 (BP Location: Left Arm)   Pulse 69   Temp 98.5 F (36.9 C) (Oral)   Resp 18   Ht 5\' 4"  (1.626 m)   Wt 88.6 kg (195 lb 5.2 oz)   SpO2 94%   BMI 33.53 kg/m   General exam: 71 y.o. female in no distress Respiratory system: Non-labored breathing room air. Clear to auscultation bilaterally.  Cardiovascular system: Regular rate and rhythm. No murmur, rub, or gallop. No JVD, and no pedal edema. Gastrointestinal system: Abdomen soft, non-tender, non-distended, with normoactive bowel sounds. No organomegaly or masses felt. Neuro: Alert and oriented. Speech slow without aphasia, mildly HOH, otherwise CN's II/XII tested and intact, proximal and distal extremities with 5/5 strength and no reported numbness. Gait not assessed.  Extremities: Warm, no deformities Skin: No rashes, lesions no ulcers Psychiatry: Judgement and insight appear normal. Mood & affect appropriate.   Data Reviewed: I have personally reviewed following labs and imaging studies  CBC:  Recent Labs Lab 07/19/17 0237 07/19/17 1157  WBC 9.4  --   NEUTROABS  --  2.6  HGB 12.1  --   HCT 36.8  --   MCV 92.0  --   PLT 179  --    Basic Metabolic Panel:  Recent Labs Lab 07/19/17 0237  NA 136  K 4.5  CL 99*  CO2 28  GLUCOSE 200*  BUN 19  CREATININE 1.23*  CALCIUM 9.1   GFR: Estimated Creatinine Clearance: 44.6 mL/min (A) (by C-G formula based on SCr of 1.23 mg/dL (H)). Liver Function Tests:  Recent Labs Lab 07/19/17 0237  AST 30  ALT 12*  ALKPHOS 74  BILITOT 0.8  PROT 7.3  ALBUMIN 3.4*    Recent Labs Lab 07/19/17 0330    AMMONIA 26   Thyroid Function Tests:  Recent Labs  07/19/17 0238  TSH 1.389   Urine analysis:    Component Value Date/Time   COLORURINE YELLOW 07/19/2017 0420   APPEARANCEUR HAZY (A) 07/19/2017 0420   LABSPEC 1.018 07/19/2017 0420   PHURINE 5.0 07/19/2017 0420   GLUCOSEU NEGATIVE 07/19/2017 0420   HGBUR NEGATIVE 07/19/2017 0420   BILIRUBINUR NEGATIVE 07/19/2017 0420   KETONESUR NEGATIVE 07/19/2017 0420   PROTEINUR NEGATIVE 07/19/2017 0420   NITRITE NEGATIVE 07/19/2017 0420   LEUKOCYTESUR TRACE (A) 07/19/2017 0420   Radiology Studies: Dg Chest 1 View  Result Date: 07/19/2017 CLINICAL DATA:  Acute onset of generalized weakness. Confusion. Initial encounter. EXAM: CHEST 1 VIEW COMPARISON:  None. FINDINGS: The lungs are hypoexpanded but appear grossly clear. There is no evidence of focal opacification, pleural effusion or pneumothorax. The cardiomediastinal silhouette is within normal limits. No acute osseous abnormalities are seen. IMPRESSION: Lungs hypoexpanded but grossly clear. Electronically Signed   By: Roanna Raider M.D.   On: 07/19/2017 03:25   Ct Head Wo Contrast  Result Date: 07/19/2017 CLINICAL DATA:  Altered level of consciousness unexplained. EXAM: CT HEAD WITHOUT CONTRAST TECHNIQUE: Contiguous axial images were obtained from the base of the skull through the vertex without intravenous contrast. COMPARISON:  None. 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 The skullbase. No large vascular territory infarct, intracranial hemorrhage, midline shift or edema. No intra-axial mass or extra-axial fluid collections are apparent. Vascular: Moderate atherosclerosis of the carotid siphons. No hyperdense vessels. Skull: Negative for fracture. Sinuses/Orbits: No acute finding. Other: None. IMPRESSION:  Mild-to-moderate small vessel ischemic disease of periventricular white matter, age indeterminate in the absence prior studies for more likely chronic given vascular calcifications at the skullbase. No acute intracranial abnormality is identified. Electronically Signed   By: Tollie Eth M.D.   On: 07/19/2017 03:28   Mr Brain Wo Contrast  Result Date: 07/19/2017 CLINICAL DATA:  Altered level consciousness, unexplained. Hyperlipidemia, hypertension, diabetes EXAM: MRI HEAD WITHOUT CONTRAST TECHNIQUE: Multiplanar, multiecho pulse sequences of the brain and surrounding structures were obtained without intravenous contrast. COMPARISON:  CT head 07/19/2017 FINDINGS: Brain: Small areas of acute infarct in the right internal capsule posteriorly and anterior thalamus. No other areas of acute infarct. Mild chronic microvascular ischemic change in the white matter. Chronic infarct left cerebellum. Ventricle size normal. Negative for hemorrhage or mass Image quality degraded by motion. Vascular: Normal arterial flow voids. 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 Mild chronic microvascular ischemic change in the white matter. Electronically Signed   By: Marlan Palau M.D.   On: 07/19/2017 19:50   Time spent: 25 minutes.  Hazeline Junker, MD [facility] Pager [phone]  If 7PM-7AM, please contact night-coverage [email or web address] Password [email or web address] 07/20/2017, 9:40 AM

## 2017-07-21 ENCOUNTER — Inpatient Hospital Stay (HOSPITAL_COMMUNITY): Payer: Medicare (Managed Care)

## 2017-07-21 ENCOUNTER — Encounter (HOSPITAL_COMMUNITY): Payer: Self-pay | Admitting: *Deleted

## 2017-07-21 DIAGNOSIS — E782 Mixed hyperlipidemia: Secondary | ICD-10-CM

## 2017-07-21 DIAGNOSIS — I63311 Cerebral infarction due to thrombosis of right middle cerebral artery: Secondary | ICD-10-CM

## 2017-07-21 DIAGNOSIS — I340 Nonrheumatic mitral (valve) insufficiency: Secondary | ICD-10-CM

## 2017-07-21 DIAGNOSIS — I951 Orthostatic hypotension: Secondary | ICD-10-CM

## 2017-07-21 DIAGNOSIS — I10 Essential (primary) hypertension: Secondary | ICD-10-CM

## 2017-07-21 LAB — ECHOCARDIOGRAM COMPLETE
AVLVOTPG: 3 mmHg
CHL CUP DOP CALC LVOT VTI: 21.1 cm
E decel time: 222 msec
EERAT: 11.61
FS: 20 % — AB (ref 28–44)
Height: 64 in
IV/PV OW: 1
LA ID, A-P, ES: 31 mm
LA diam end sys: 31 mm
LA diam index: 1.6 cm/m2
LAVOL: 38.9 mL
LAVOLA4C: 39.6 mL
LAVOLIN: 20.1 mL/m2
LV PW d: 10 mm — AB (ref 0.6–1.1)
LV e' LATERAL: 6.41 cm/s
LVEEAVG: 11.61
LVEEMED: 11.61
LVOT SV: 66 mL
LVOT area: 3.14 cm2
LVOT diameter: 20 mm
LVOT peak vel: 82.5 cm/s
MV Dec: 222
MVPG: 2 mmHg
MVPKAVEL: 84.8 m/s
MVPKEVEL: 74.4 m/s
TDI e' lateral: 6.41
TDI e' medial: 5.29
Weight: 3120 oz

## 2017-07-21 LAB — GLUCOSE, CAPILLARY
GLUCOSE-CAPILLARY: 244 mg/dL — AB (ref 65–99)
GLUCOSE-CAPILLARY: 259 mg/dL — AB (ref 65–99)
Glucose-Capillary: 286 mg/dL — ABNORMAL HIGH (ref 65–99)
Glucose-Capillary: 273 mg/dL — ABNORMAL HIGH (ref 65–99)

## 2017-07-21 LAB — LIPID PANEL
CHOL/HDL RATIO: 5.7 ratio
Cholesterol: 182 mg/dL (ref 0–200)
HDL: 32 mg/dL — ABNORMAL LOW (ref >40)
LDL CALC: 126 mg/dL — AB (ref 0–99)
Triglycerides: 119 mg/dL (ref <150)
VLDL: 24 mg/dL (ref 0–40)

## 2017-07-21 LAB — HEMOGLOBIN A1C
Hgb A1c MFr Bld: 8.5 % — ABNORMAL HIGH (ref 4.8–5.6)
MEAN PLASMA GLUCOSE: 197 mg/dL

## 2017-07-21 MED ORDER — ATORVASTATIN CALCIUM 40 MG PO TABS
40.0000 mg | ORAL_TABLET | Freq: Every day | ORAL | Status: DC
  Administered 2017-07-21: 40 mg via ORAL
  Filled 2017-07-21: qty 1

## 2017-07-21 MED ORDER — CLOPIDOGREL BISULFATE 75 MG PO TABS
75.0000 mg | ORAL_TABLET | Freq: Every day | ORAL | Status: DC
  Administered 2017-07-21 – 2017-07-22 (×2): 75 mg via ORAL
  Filled 2017-07-21 (×2): qty 1

## 2017-07-21 MED ORDER — AMLODIPINE BESYLATE 5 MG PO TABS
5.0000 mg | ORAL_TABLET | Freq: Every day | ORAL | Status: DC
  Administered 2017-07-21 – 2017-07-22 (×2): 5 mg via ORAL
  Filled 2017-07-21 (×2): qty 1

## 2017-07-21 NOTE — Progress Notes (Signed)
Occupational Therapy Evaluation Patient Details Name: Carrie Warner MRN: [id number] DOB: 1945-12-15 Today's Date: 07/21/2017    History of Present Illness Pt is a 71 y/o female admitted secondary to CVA. MRI revealed infarct in R anterior thalamus and posterior limb of internal capsule. PMH includes HTN and DM.    Clinical Impression   PTA, pt independent with ADL and mobility. Pt currently requires min A for mobility and ADL @ RW level. Pt will benefit from [facility] due to deficits listed below. Will follow acutely to address established goals and facilitate safe DC home with 24/7 S.     Follow Up Recommendations  Home health OT;Supervision/Assistance - 24 hour    Equipment Recommendations  3 in 1 bedside commode    Recommendations for Other Services       Precautions / Restrictions Precautions Precautions: Fall Restrictions Weight Bearing Restrictions: No      Mobility Bed Mobility Overal bed mobility:  Bed Mobility: Supine to Sit     Supine to sit: Supervision Sit to supine: Modified independent (Device/Increase time)     Transfers Overall transfer level: Needs assistance Equipment used: Rolling walker (2 wheeled) Transfers: Sit to/from [employer] Sit to Stand: S Stand pivot transfers:  (LOB to R)       General transfer comment:     Balance Overall balance assessment: Needs assistance Sitting-balance support: No upper extremity supported;Feet supported Sitting balance-Leahy Scale: Good     Standing balance support: During functional activity Standing balance-Leahy Scale: Poor Standing balance comment:  LOB without support                           ADL either performed or assessed with clinical judgement   ADL Overall ADL's : Needs assistance/impaired Eating/Feeding: Set up   Grooming: Standing;Minimal assistance (for balance)   Upper Body Bathing: Set up;Supervision/ safety;Sitting   Lower Body Bathing: Minimal  assistance;Sit to/from stand   Upper Body Dressing : Supervision/safety;Set up;Sitting   Lower Body Dressing: Minimal assistance;Sit to/from stand   Toilet Transfer: Minimal IT consultant and Hygiene: Min guard;Sit to/from Nurse, children's Details (indicate cue type and reason): Discused options of using DME for tub however, bathroom not RW accessible. Recommend pt sponge bath at this time adn have HHOT assess bathroom saefty/mobility.  Functional mobility during ADLs: Minimal assistance;Rolling walker;Cueing for safety;Cueing for sequencing General ADL Comments: LOB while walking without RW. Poor insight into balance deficits     Vision Baseline Vision/History: Wears glasses Additional Comments: Appears intact. Will further assess     Perception     Praxis Praxis Praxis-Other Comments: appears intact    Pertinent Vitals/Pain Pain Assessment: No/denies pain     Hand Dominance Right   Extremity/Trunk Assessment Upper Extremity Assessment Upper Extremity Assessment: LUE deficits/detail LUE Deficits / Details: strength overall WFL. Decreased coordination. Poor in hand manipulation; difficulty with BUE coordination. Using hand functionally with minimal difficulty LUE Coordination: decreased fine motor   Lower Extremity Assessment Lower Extremity Assessment: Defer to PT evaluation    Cervical / Trunk Assessment Cervical / Trunk Assessment: Normal   Communication Communication Communication: No difficulties   Cognition Arousal/Alertness: Awake/alert Behavior During Therapy: WFL for tasks assessed/performed Overall Cognitive Status: Impaired/Different from baseline Area of Impairment: Memory;Safety/judgement;Awareness;Problem solving                     Memory: Decreased short-term memory;Decreased recall of  precautions   Safety/Judgement: Decreased awareness of safety;Decreased awareness  of deficits Awareness: Emergent Problem Solving: Slow processing;Difficulty sequencing;Requires verbal cues;Requires tactile cues General Comments: Pt with decreased safety awareness and asking if we could put BSC beside bed so she could get up and go. Educated about importance of having assist with mobility.    General Comments    Exercises     Shoulder Instructions      Home Living Family/patient expects to be discharged to:: Private residence Living Arrangements: Children Available Help at Discharge: Family;Available 24 hours/day Type of Home: Apartment Home Access: Stairs to enter Entrance Stairs-Number of Steps: 12 Entrance Stairs-Rails: Right Home Layout: One level     Bathroom Shower/Tub: IT trainer: Standard Bathroom Accessibility: No   Home Equipment: None          Prior Functioning/Environment Level of Independence: Independent        Comments: Daughter reports pt was very independent         OT Problem List: Decreased activity tolerance;Impaired balance (sitting and/or standing);Decreased coordination;Decreased safety awareness;Decreased knowledge of use of DME or AE;Obesity;Impaired UE functional use      OT Treatment/Interventions: Self-care/ADL training;Therapeutic exercise;Neuromuscular education;DME and/or AE instruction;Therapeutic activities;Cognitive remediation/compensation;Patient/family education;Balance training    OT Goals(Current goals can be found in the care plan section) Acute Rehab OT Goals Patient Stated Goal: to go home  OT Goal Formulation: With patient Time For Goal Achievement: 08/04/17 Potential to Achieve Goals: Good ADL Goals Pt Will Transfer to Toilet: with supervision;ambulating;bedside commode (RW; caregiver independent in assisting) Pt Will Perform Toileting - Clothing Manipulation and hygiene: with modified independence Pt/caregiver will Perform Home Exercise Program: Left upper  extremity;With theraputty;With written HEP provided;With Supervision (fine motor/coordination) Additional ADL Goal #1: Pt/family will independently verbalzie 3 strategies to reduce risk of falls  OT Frequency: Min 2X/week   Barriers to D/C:            Co-evaluation              AM-PAC PT "6 Clicks" Daily Activity     Outcome Measure Help from another person eating meals?: None Help from another person taking care of personal grooming?: A Little Help from another person toileting, which includes using toliet, bedpan, or urinal?: A Little Help from another person bathing (including washing, rinsing, drying)?: A Little Help from another person to put on and taking off regular upper body clothing?: A Little Help from another person to put on and taking off regular lower body clothing?: A Little 6 Click Score: 19   End of Session Equipment Utilized During Treatment: Gait belt;Rolling walker Nurse Communication: Mobility status  Activity Tolerance: Patient tolerated treatment well Patient left: in chair;with call bell/phone within reach;with chair alarm set;with family/visitor present  OT Visit Diagnosis: Other abnormalities of gait and mobility (R26.89);Muscle weakness (generalized) (M62.81);Other symptoms and signs involving cognitive function                Time: 1425-1455 OT Time Calculation (min): 30 min Charges:  OT General Charges $OT Visit: 1 Procedure OT Evaluation $OT Eval Moderate Complexity: 1 Procedure OT Treatments $Self Care/Home Management : 8-22 mins G-Codes:     [facility], OT/L  [phone] 07/21/2017  Marene Gilliam,HILLARY 07/21/2017, 4:38 PM

## 2017-07-21 NOTE — Progress Notes (Signed)
PROGRESS NOTE  Carrie Warner  ZOX:[id number] DOB: [birth year] DOA: 07/19/2017 PCP: Jackie Plum, MD  Brief Narrative:  Carrie Warner is a 71 y.o. female with a history of T2DM, HTN, HLD brought to the ED by her daughter for somnolence, left face droop and drooling, and inability to bear weight. On arrival she had to be carried into ED, was hypotensive (70s/40s), difficult to arouse. BPs improved with IVFs but remained orthostatic with significant symptoms, remained unable to bear weight. Neuro exam demonstrated slow speech but no focal deficits. CT head showed chronic microvascular disease, but subsequent MRI showed acute right ischemic infarcts in the posterior limb of the internal capsule and anterior thalamus.    Assessment & Plan:   Principal Problem:   CVA (cerebral vascular accident) (HCC) Active Problems:   Orthostatic hypotension   Essential hypertension   Diabetes mellitus due to underlying condition (HCC)  Ischemic CVA: Involving right PLIC, anterior thalamus in setting of chronic CVD. Previous cerebellar infarct also noted.  Has moderate to severe left paraclinoid ICA stenosis with extensive intracranial atherosclerosis.   - Stroke team to consult  - Continue telemetry:  Sinus rhythm - Carotid U/S pending - Echocardiogram performed, results pending - Lipid panel LDL 126, elevated.  Start atorvastatin - HbA1c pending -  May need dual antiplatelet therapy for three months, but defer to stroke team  Orthostatic hypotension, resolved - start norvasc -  Will discontinue diuretics for now  IDT2DM:  - Check HbA1c as above - Hold metformin - SSI ordered. Holding home insulins for now  Hyperlipidemia: starting atorvastatin  Urinary incontinence -  UA negative  DVT prophylaxis: Lovenox Code Status: Full Family Communication: Daughter at bedside Disposition Plan:  awaiting ECHO, carotid duplex, and evaluations by therapy.    Consultants:    Neurology  Procedures:  ECHO Carotid duplex CT angio head and neck  Antimicrobials:  Anti-infectives    None       Subjective: Denies confusion, slurred speech, difficulty speaking, weakness, but reports having some right hand numbness for the last week and urinary incontinence for the last few days.  Daughter reports that her mother has been confused and ahs been slurring her words  Objective: Vitals:   07/20/17 2121 07/21/17 0102 07/21/17 0534 07/21/17 0835  BP: (!) 161/82 (!) 146/84 (!) 141/73 (!) 154/73  Pulse: 68 71 74 68  Resp: 18 18 16 16   Temp: 98.1 F (36.7 C)  98.2 F (36.8 C) 98.1 F (36.7 C)  TempSrc: Oral  Oral Oral  SpO2: 99% 99% 99% 96%  Weight:      Height:        Intake/Output Summary (Last 24 hours) at 07/21/17 1154 Last data filed at 07/21/17 0700  Gross per 24 hour  Intake           1887.5 ml  Output             1500 ml  Net            387.5 ml   Filed Weights   07/19/17 0233 07/19/17 1422  Weight: 87.1 kg (192 lb) 88.6 kg (195 lb 5.2 oz)    Examination:  General exam:  Adult female, lying in bed, sleepy but arousable to voice.  No acute distress.  HEENT:  NCAT, MMM Respiratory system: Clear to auscultation bilaterally Cardiovascular system: Regular rate and rhythm, normal S1/S2. No murmurs, rubs, gallops or clicks.  Warm extremities Gastrointestinal system: Normal active bowel sounds, soft, nondistended, nontender. MSK:  Normal tone and bulk, no lower extremity edema Neuro:  No facial droop, EOMI, PERRL, bilateral upper extremities 5/5, left lower extremity 4/5, right lower extremity 5/5.  No dysmetria.  Numbness of the right hand, otherwise intact.      Data Reviewed: I have personally reviewed following labs and imaging studies  CBC:  Recent Labs Lab 07/19/17 0237 07/19/17 1157  WBC 9.4  --   NEUTROABS  --  2.6  HGB 12.1  --   HCT 36.8  --   MCV 92.0  --   PLT 179  --    Basic Metabolic Panel:  Recent Labs Lab  07/19/17 0237  NA 136  K 4.5  CL 99*  CO2 28  GLUCOSE 200*  BUN 19  CREATININE 1.23*  CALCIUM 9.1   GFR: Estimated Creatinine Clearance: 44.6 mL/min (A) (by C-G formula based on SCr of 1.23 mg/dL (H)). Liver Function Tests:  Recent Labs Lab 07/19/17 0237  AST 30  ALT 12*  ALKPHOS 74  BILITOT 0.8  PROT 7.3  ALBUMIN 3.4*   No results for input(s): LIPASE, AMYLASE in the last 168 hours.  Recent Labs Lab 07/19/17 0330  AMMONIA 26   Coagulation Profile: No results for input(s): INR, PROTIME in the last 168 hours. Cardiac Enzymes: No results for input(s): CKTOTAL, CKMB, CKMBINDEX, TROPONINI in the last 168 hours. BNP (last 3 results) No results for input(s): PROBNP in the last 8760 hours. HbA1C:  Recent Labs  07/19/17 1157  HGBA1C 8.5*   CBG:  Recent Labs Lab 07/20/17 1136 07/20/17 1623 07/20/17 2114 07/21/17 0608 07/21/17 1128  GLUCAP 234* 255* 290* 244* 286*   Lipid Profile:  Recent Labs  07/21/17 0329  CHOL 182  HDL 32*  LDLCALC 126*  TRIG 119  CHOLHDL 5.7   Thyroid Function Tests:  Recent Labs  07/19/17 0238  TSH 1.389   Anemia Panel: No results for input(s): VITAMINB12, FOLATE, FERRITIN, TIBC, IRON, RETICCTPCT in the last 72 hours. Urine analysis:    Component Value Date/Time   COLORURINE YELLOW 07/19/2017 0420   APPEARANCEUR HAZY (A) 07/19/2017 0420   LABSPEC 1.018 07/19/2017 0420   PHURINE 5.0 07/19/2017 0420   GLUCOSEU NEGATIVE 07/19/2017 0420   HGBUR NEGATIVE 07/19/2017 0420   BILIRUBINUR NEGATIVE 07/19/2017 0420   KETONESUR NEGATIVE 07/19/2017 0420   PROTEINUR NEGATIVE 07/19/2017 0420   NITRITE NEGATIVE 07/19/2017 0420   LEUKOCYTESUR TRACE (A) 07/19/2017 0420   Sepsis Labs: @LABRCNTIP (procalcitonin:4,lacticidven:4)  )No results found for this or any previous visit (from the past 240 hour(s)).    Radiology Studies: Ct Angio Head W Or Wo Contrast  Result Date: 07/20/2017 CLINICAL DATA:  71 y/o  F; follow-up of acute  stroke. EXAM: CT ANGIOGRAPHY HEAD AND NECK TECHNIQUE: Multidetector CT imaging of the head and neck was performed using the standard protocol during bolus administration of intravenous contrast. Multiplanar CT image reconstructions and MIPs were obtained to evaluate the vascular anatomy. Carotid stenosis measurements (when applicable) are obtained utilizing NASCET criteria, using the distal internal carotid diameter as the denominator. CONTRAST:  50 cc Isovue 370 COMPARISON:  07/19/2017 MRI of the brain. FINDINGS: CT HEAD FINDINGS Brain: Small lucency within right anterior thalamus/ posterior limb of internal capsule corresponding to infarction on the prior MRI of the brain. No evidence for new large territory infarct, acute intracranial hemorrhage, or focal mass effect. Small chronic lacunar infarct within the left cerebellar hemisphere, left caudate head, and left thalamus. Stable background of chronic microvascular ischemic changes and parenchymal  volume loss of the brain. Vascular: As below. Skull: Normal. Negative for fracture or focal lesion. Sinuses: Imaged portions are clear. Orbits: No acute finding. Review of the MIP images confirms the above findings CTA NECK FINDINGS Aortic arch: Mild calcific atherosclerosis of the aortic arch. Calcific atherosclerosis of left subclavian artery origin with mild 40% stenosis. Right carotid system: No evidence of dissection, stenosis (50% or greater) or occlusion. Mild non stenotic calcification of right carotid bifurcation. Left carotid system: No evidence of dissection, stenosis (50% or greater) or occlusion. Mild non stenotic calcification of left carotid bifurcation. Vertebral arteries: Right dominant. No evidence of dissection, stenosis (50% or greater) or occlusion. 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 Disc and facet degenerative changes without high-grade bony canal or foraminal stenosis. Other neck:  Negative. Upper chest: Negative. Review of the MIP images confirms the above findings CTA HEAD FINDINGS Anterior circulation: No large vessel occlusion, aneurysm, or vascular malformation identified. Severe calcific atherosclerosis of carotid siphons with moderate right and moderate to severe left paraclinoid stenosis (series 12, image 121). Multiple segments of mild-to-moderate stenosis and bilateral M1 and proximal M2 branches. Severe stenosis of proximal left A1. Posterior circulation: No large vessel occlusion, aneurysm, or vascular malformation identified. Non stenotic calcified plaque of the right V4 segment. Mild basilar irregularity with Rylynne Schicker segments of mild stenosis in the mid and upper artery. Segments of stenosis and bilateral P1 arteries, moderate on the right and mild on the left. Venous sinuses: As permitted by contrast timing, patent. Anatomic variants: Small anterior communicating artery. No posterior communicating artery is identified, hypoplastic or absent. Delayed phase: No abnormal intracranial enhancement. Review of the MIP images confirms the above findings IMPRESSION: CT head: 1. Small lucency within right anterior thalamus/posterior limb of internal capsule corresponding to infarct on prior MRI. 2. No new acute intracranial abnormality identified. 3. Stable background of chronic microvascular ischemic changes, parenchymal volume loss, and chronic lacunar infarcts in the brain. CTA neck: 1. Patent carotid and vertebral arteries. No large vessel occlusion, aneurysm, or significant stenosis is identified. 2. Aortic atherosclerosis with mild 40% stenosis of left subclavian artery origin. CTA head: 1. Patent circle of Willis. No large vessel occlusion, aneurysm, or vascular malformation identified. 2. Extensive intracranial atherosclerosis with multiple areas of stenosis greatest in the left paraclinoid ICA segment where it is moderate to severe. Electronically Signed   By: Mitzi Hansen M.D.   On: 07/20/2017 23:28   Ct Angio Neck W Or Wo Contrast  Result Date: 07/20/2017 CLINICAL DATA:  71 y/o  F; follow-up of acute stroke. EXAM: CT ANGIOGRAPHY HEAD AND NECK TECHNIQUE: Multidetector CT imaging of the head and neck was performed using the standard protocol during bolus administration of intravenous contrast. Multiplanar CT image reconstructions and MIPs were obtained to evaluate the vascular anatomy. Carotid stenosis measurements (when applicable) are obtained utilizing NASCET criteria, using the distal internal carotid diameter as the denominator. CONTRAST:  50 cc Isovue 370 COMPARISON:  07/19/2017 MRI of the brain. FINDINGS: CT HEAD FINDINGS Brain: Small lucency within right anterior thalamus/ posterior limb of internal capsule corresponding to infarction on the prior MRI of the brain. No evidence for new large territory infarct, acute intracranial hemorrhage, or focal mass effect. Small chronic lacunar infarct within the left cerebellar hemisphere, left caudate head, and left thalamus. Stable background of chronic microvascular ischemic changes and parenchymal volume loss of the brain. Vascular: As below. Skull: Normal. Negative for fracture or focal lesion. Sinuses: Imaged  portions are clear. Orbits: No acute finding. Review of the MIP images confirms the above findings CTA NECK FINDINGS Aortic arch: Mild calcific atherosclerosis of the aortic arch. Calcific atherosclerosis of left subclavian artery origin with mild 40% stenosis. Right carotid system: No evidence of dissection, stenosis (50% or greater) or occlusion. Mild non stenotic calcification of right carotid bifurcation. Left carotid system: No evidence of dissection, stenosis (50% or greater) or occlusion. Mild non stenotic calcification of left carotid bifurcation. Vertebral arteries: Right dominant. No evidence of dissection, stenosis (50% or greater) or occlusion. Skeleton: Ossification of anterior longitudinal  ligament across many segments compatible with DISH. Disc and facet degenerative changes without high-grade bony canal or foraminal stenosis. Other neck: Negative. Upper chest: Negative. Review of the MIP images confirms the above findings CTA HEAD FINDINGS Anterior circulation: No large vessel occlusion, aneurysm, or vascular malformation identified. Severe calcific atherosclerosis of carotid siphons with moderate right and moderate to severe left paraclinoid stenosis (series 12, image 121). Multiple segments of mild-to-moderate stenosis and bilateral M1 and proximal M2 branches. Severe stenosis of proximal left A1. Posterior circulation: No large vessel occlusion, aneurysm, or vascular malformation identified. Non stenotic calcified plaque of the right V4 segment. Mild basilar irregularity with Lovelle Deitrick segments of mild stenosis in the mid and upper artery. Segments of stenosis and bilateral P1 arteries, moderate on the right and mild on the left. Venous sinuses: As permitted by contrast timing, patent. Anatomic variants: Small anterior communicating artery. No posterior communicating artery is identified, hypoplastic or absent. Delayed phase: No abnormal intracranial enhancement. Review of the MIP images confirms the above findings IMPRESSION: CT head: 1. Small lucency within right anterior thalamus/posterior limb of internal capsule corresponding to infarct on prior MRI. 2. No new acute intracranial abnormality identified. 3. Stable background of chronic microvascular ischemic changes, parenchymal volume loss, and chronic lacunar infarcts in the brain. CTA neck: 1. Patent carotid and vertebral arteries. No large vessel occlusion, aneurysm, or significant stenosis is identified. 2. Aortic atherosclerosis with mild 40% stenosis of left subclavian artery origin. CTA head: 1. Patent circle of Willis. No large vessel occlusion, aneurysm, or vascular malformation identified. 2. Extensive intracranial atherosclerosis with  multiple areas of stenosis greatest in the left paraclinoid ICA segment where it is moderate to severe. Electronically Signed   By: Mitzi Hansen M.D.   On: 07/20/2017 23:28   Mr Brain Wo Contrast  Result Date: 07/19/2017 CLINICAL DATA:  Altered level consciousness, unexplained. Hyperlipidemia, hypertension, diabetes EXAM: MRI HEAD WITHOUT CONTRAST TECHNIQUE: Multiplanar, multiecho pulse sequences of the brain and surrounding structures were obtained without intravenous contrast. COMPARISON:  CT head 07/19/2017 FINDINGS: Brain: Small areas of acute infarct in the right internal capsule posteriorly and anterior thalamus. No other areas of acute infarct. Mild chronic microvascular ischemic change in the white matter. Chronic infarct left cerebellum. Ventricle size normal. Negative for hemorrhage or mass Image quality degraded by motion. Vascular: Normal arterial flow voids. 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 Mild chronic microvascular ischemic change in the white matter. Electronically Signed   By: Marlan Palau M.D.   On: 07/19/2017 19:50     Scheduled Meds: .  stroke: mapping our early stages of recovery book   Does not apply Once  . aspirin EC  325 mg Oral Daily  . clopidogrel  75 mg Oral Daily  . enoxaparin (LOVENOX)  injection  40 mg Subcutaneous Q24H  . insulin aspart  0-5 Units Subcutaneous QHS  . insulin aspart  0-9 Units Subcutaneous TID WC  . insulin glargine  25 Units Subcutaneous Daily   Continuous Infusions: . sodium chloride 75 mL/hr at 07/21/17 0700     LOS: 1 day    Time spent: 30 min    Renae Fickle, MD [facility] Pager [phone]  If 7PM-7AM, please contact night-coverage [email or web address] Password [email or web address] 07/21/2017, 11:54 AM

## 2017-07-21 NOTE — Evaluation (Signed)
Physical Therapy Evaluation Patient Details Name: Carrie Warner MRN: [id number] DOB: 01-25-1946 Today's Date: 07/21/2017   History of Present Illness  Pt is a 71 y/o female admitted secondary to CVA. MRI revealed infarct in R anterior thalamus and posterior limb of internal capsule. PMH includes HTN and DM.   Clinical Impression  Pt admitted secondary to problem above with deficits below. PTA, pt was independent with functional mobility. Upon eval, pt with decreased cognition, decreased balance and L sided strength deficits. Attempted gait without AD, and pt requiring min to mod A for stability. Increased stability with use of RW and required min guard for safety. [place], daughter reports pt will be staying with her at her apartment. Recommending HHPT at d/c to increase independence and safety with functional mobility. Will continue to follow acutely to maximize functional mobility independence and safety.     Follow Up Recommendations Home health PT;Supervision/Assistance - 24 hour    Equipment Recommendations  Rolling walker with 5" wheels    Recommendations for Other Services       Precautions / Restrictions Precautions Precautions: Fall Restrictions Weight Bearing Restrictions: No      Mobility  Bed Mobility Overal bed mobility: Needs Assistance Bed Mobility: Supine to Sit;Sit to Supine     Supine to sit: Min assist Sit to supine: Modified independent (Device/Increase time)   General bed mobility comments: Min A for trunk elevation. No assist required for return to supine.   Transfers Overall transfer level: Needs assistance Equipment used: Rolling walker (2 wheeled) Transfers: Sit to/from Stand Sit to Stand: Min assist         General transfer comment: Min A for steadying upon standing. Pt initially with posterior lean, however, able to correct with cues. Verbal cues for safe hand placement.   Ambulation/Gait Ambulation/Gait  assistance: Mod assist;Min guard Ambulation Distance (Feet): 50 Feet Assistive device: Rolling walker (2 wheeled) Gait Pattern/deviations: Step-through pattern;Decreased stride length;Trunk flexed Gait velocity: Decreased Gait velocity interpretation: Below normal speed for age/gender General Gait Details: Attempted gait without AD, however, pt requirng min to mod A for steadying secondary to decreased balance. With use of RW, pt able to decrease assist to min guard for safety. Verbal cues for sequencing with use of RW and upright posture. Edcuated to use at home.   Stairs            Wheelchair Mobility    Modified Rankin (Stroke Patients Only) Modified Rankin (Stroke Patients Only) Pre-Morbid Rankin Score: No significant disability Modified Rankin: Moderately severe disability     Balance Overall balance assessment: Needs assistance Sitting-balance support: No upper extremity supported;Feet supported Sitting balance-Leahy Scale: Good     Standing balance support: Bilateral upper extremity supported;During functional activity Standing balance-Leahy Scale: Poor Standing balance comment: Reliant on UE support on RW.                              Pertinent Vitals/Pain Pain Assessment: No/denies pain    Home Living Family/patient expects to be discharged to:: Private residence Living Arrangements: Children Available Help at Discharge: Family;Available 24 hours/day Type of Home: Apartment Home Access: Stairs to enter Entrance Stairs-Rails: Right Entrance Stairs-Number of Steps: 12 Home Layout: One level Home Equipment: None      Prior Function Level of Independence: Independent         Comments: Daughter reports pt was very independent      Hand  Dominance   Dominant Hand: Right    Extremity/Trunk Assessment   Upper Extremity Assessment Upper Extremity Assessment: Defer to OT evaluation    Lower Extremity Assessment Lower Extremity  Assessment: LLE deficits/detail LLE Deficits / Details: Grossly 3-/5 in hip flexors and 4- in     Cervical / Trunk Assessment Cervical / Trunk Assessment: Normal  Communication   Communication: No difficulties  Cognition Arousal/Alertness: Awake/alert Behavior During Therapy: WFL for tasks assessed/performed Overall Cognitive Status: Impaired/Different from baseline Area of Impairment: Memory;Safety/judgement;Awareness;Problem solving                     Memory: Decreased short-term memory;Decreased recall of precautions   Safety/Judgement: Decreased awareness of safety;Decreased awareness of deficits Awareness: Emergent Problem Solving: Slow processing;Difficulty sequencing;Requires verbal cues;Requires tactile cues General Comments: Pt with decreased safety awareness and asking if we could put BSC beside bed so she could get up and go. Educated about importance of having assist with mobility.       General Comments General comments (skin integrity, edema, etc.): Pt's daughter in room and provided PLOF and home info. Educated about follow up recommendations and pt agreeable. Pt voiding at beginning of session upon standing. Notified RN and NT.     Exercises     Assessment/Plan    PT Assessment Patient needs continued PT services  PT Problem List Decreased strength;Decreased activity tolerance;Decreased balance;Decreased mobility;Decreased knowledge of use of DME;Decreased safety awareness;Decreased knowledge of precautions       PT Treatment Interventions DME instruction;Stair training;Gait training;Functional mobility training;Therapeutic activities;Therapeutic exercise;Balance training;Neuromuscular re-education;Patient/family education;Cognitive remediation    PT Goals (Current goals can be found in the Care Plan section)  Acute Rehab PT Goals Patient Stated Goal: to go home  PT Goal Formulation: With patient/family Time For Goal Achievement: 08/04/17 Potential  to Achieve Goals: Good    Frequency Min 4X/week   Barriers to discharge        Co-evaluation               AM-PAC PT "6 Clicks" Daily Activity  Outcome Measure Difficulty turning over in bed (including adjusting bedclothes, sheets and blankets)?: A Little Difficulty moving from lying on back to sitting on the side of the bed? : Total Difficulty sitting down on and standing up from a chair with arms (e.g., wheelchair, bedside commode, etc,.)?: Total Help needed moving to and from a bed to chair (including a wheelchair)?: A Little Help needed walking in hospital room?: A Little Help needed climbing 3-5 steps with a railing? : A Little 6 Click Score: 14    End of Session Equipment Utilized During Treatment: Gait belt Activity Tolerance: Patient tolerated treatment well Patient left: in bed;with call bell/phone within reach;with bed alarm set;with family/visitor present Nurse Communication: Mobility status PT Visit Diagnosis: Unsteadiness on feet (R26.81);Other abnormalities of gait and mobility (R26.89);Hemiplegia and hemiparesis Hemiplegia - Right/Left: Left Hemiplegia - dominant/non-dominant: Non-dominant Hemiplegia - caused by: Cerebral infarction    Time: 5093-2671 PT Time Calculation (min) (ACUTE ONLY): 27 min   Charges:   PT Evaluation $PT Eval Moderate Complexity: 1 Mod PT Treatments $Gait Training: 8-22 mins   PT G Codes:        Gladys Damme, PT, DPT  Acute Rehabilitation Services  Pager: [phone]   Lehman Prom 07/21/2017, 3:12 PM

## 2017-07-21 NOTE — Progress Notes (Signed)
  Echocardiogram 2D Echocardiogram has been performed.  Carrie Warner 07/21/2017, 10:00 AM

## 2017-07-21 NOTE — Care Management Note (Signed)
Case Management Note  Patient Details  Name: Nabeela Roda MRN: [id number] Date of Birth: 03/12/1946  Subjective/Objective:                    Action/Plan: Awaiting PT/OT recommendations. CM following for d/c needs, physician orders.   Expected Discharge Date:                  Expected Discharge Plan:  Home/Self Care  In-House Referral:  NA  Discharge planning Services  CM Consult  Post Acute Care Choice:  NA Choice offered to:  NA  DME Arranged:  N/A DME Agency:  NA  HH Arranged:  NA HH Agency:  NA  Status of Service:  In process, will continue to follow  If discussed at Long Length of Stay Meetings, dates discussed:    Additional Comments:  Kermit Balo, RN 07/21/2017, 11:07 AM

## 2017-07-21 NOTE — Progress Notes (Signed)
STROKE TEAM PROGRESS NOTE   HISTORY OF PRESENT ILLNESS (per record) Carrie Warner is an 71 y.o. female with a past medical history of hypertension, hyperlipidemia and DM2, who presented to [facility] with weakness, AMS, left facial droop and drooling.  She arrived to the ED following a several hour of unresponsiveness that began at a church in [place] and ended after she received IV fluids.  The patient's children noticed that she has had progressive trouble walking over the past three months with more acute worsening over the past 3-4 days.  On arrival, Carrie Warner was hypotensive and difficult to rouse with a left facial droop and drooling. Per report, She had positive orthostatic vital signs despite judicious fluid resuscitation. She has no recollection of the events that brought her here.  On initial exam by the neurohospitalist, she was sitting in bed, and her facial droop and drooling had resolved. She was alert and oriented, able to name objects. Denies dizziness, light headedness, headache and any pain. She was eager to eat her lunch. Her daughter noted that, since being admitted, she occasionally gets light headed and asks to lay down. She also states that her mother has been confused since getting here (mistaking events from today as those from last week).  At baseline, Carrie Warner walks independently. She looks after her own affairs, bathes, cooks and cleans for herself.  MRI of the brain on 07/19/17 showed small areas of acute infarct in the right posterior limb internal capsule and right anterior thalamus, and neurology was called consulted for additional evaluation.      Date last known well: Unable to determine Time last known well: Unable to determine Modified Rankin Score: 1  Patient was not administered IV t-PA secondary to arriving outside of the treatment window. She was admitted to General Neurology for further evaluation and treatment.   SUBJECTIVE (INTERVAL HISTORY) Her daughter is at  the bedside.  The patient is eating without assistance.  She is alert, and follows all commands appropriately.  Pt on ASA 325mg  daily.  Added plavix 75mg  daily.   OBJECTIVE Temp:  [98.1 F (36.7 C)-98.2 F (36.8 C)] 98.1 F (36.7 C) (08/13 1240) Pulse Rate:  [68-84] 68 (08/13 1240) Cardiac Rhythm: Normal sinus rhythm (08/13 0700) Resp:  [16-18] 18 (08/13 1240) BP: (141-161)/(73-84) 145/84 (08/13 1240) SpO2:  [96 %-100 %] 100 % (08/13 1240)  CBC:   Recent Labs Lab 07/19/17 0237 07/19/17 1157  WBC 9.4  --   NEUTROABS  --  2.6  HGB 12.1  --   HCT 36.8  --   MCV 92.0  --   PLT 179  --     Basic Metabolic Panel:   Recent Labs Lab 07/19/17 0237  NA 136  K 4.5  CL 99*  CO2 28  GLUCOSE 200*  BUN 19  CREATININE 1.23*  CALCIUM 9.1    Lipid Panel:     Component Value Date/Time   CHOL 182 07/21/2017 0329   TRIG 119 07/21/2017 0329   HDL 32 (L) 07/21/2017 0329   CHOLHDL 5.7 07/21/2017 0329   VLDL 24 07/21/2017 0329   LDLCALC 126 (H) 07/21/2017 0329   HgbA1c:  Lab Results  Component Value Date   HGBA1C 8.5 (H) 07/19/2017   Urine Drug Screen: No results found for: LABOPIA, COCAINSCRNUR, LABBENZ, AMPHETMU, THCU, LABBARB  Alcohol Level No results found for: [facility] (2-Rh)  IMAGING I have personally reviewed the radiological images below and agree with the radiology interpretations.  CT Head 07/19/2017  IMPRESSION: Mild-to-moderate small vessel ischemic disease of periventricular white matter, age indeterminate in the absence prior studies for more likely chronic given vascular calcifications at the skullbase.  No acute intracranial abnormality is identified.  Ct Angio Head W Or Wo Contrast Ct Angio Neck W Or Wo Contrast 07/20/2017 IMPRESSION: CT head: 1. Small lucency within right anterior thalamus/posterior limb of internal capsule corresponding to infarct on prior MRI. 2. No new acute intracranial abnormality identified. 3. Stable background of chronic microvascular  ischemic changes, parenchymal volume loss, and chronic lacunar infarcts in the brain. CTA neck: 1. Patent carotid and vertebral arteries. No large vessel occlusion, aneurysm, or significant stenosis is identified. 2. Aortic atherosclerosis with mild 40% stenosis of left subclavian artery origin. CTA head: 1. Patent circle of Willis. No large vessel occlusion, aneurysm, or vascular malformation identified. 2. Extensive intracranial atherosclerosis with multiple areas of stenosis greatest in the left paraclinoid ICA segment where it is moderate to severe.  Mr Brain Wo Contrast 07/19/2017 IMPRESSION: Small areas of acute infarct in the posterior limb internal capsule on the right and in the right anterior thalamus. Mild chronic microvascular ischemic change in the white matter.  TTE 07/21/2017 Study Conclusions - Left ventricle: The cavity size was normal. Wall thickness was normal. Systolic function was normal. The estimated ejection fraction was in the range of 55% to 60%. Wall motion was normal; there were no regional wall motion abnormalities. Doppler parameters are consistent with abnormal left ventricular relaxation (grade 1 diastolic dysfunction). - Mitral valve: Calcified annulus. There was mild regurgitation. Impressions: - Normal LV systolic function; mild diastolic dysfunction; mild MR.   PHYSICAL EXAM  Temp:  [98.1 F (36.7 C)-98.2 F (36.8 C)] 98.2 F (36.8 C) (08/13 1650) Pulse Rate:  [68-74] 69 (08/13 1650) Resp:  [16-18] 18 (08/13 1650) BP: (141-154)/(73-84) 151/80 (08/13 1650) SpO2:  [96 %-100 %] 100 % (08/13 1650)  General - Well nourished, well developed, in no apparent distress.  Ophthalmologic - Sharp disc margins OU.   Cardiovascular - Regular rate and rhythm.  Mental Status -  Level of arousal and orientation to time, place, and person were intact. Language including expression, naming, repetition, comprehension was assessed and found intact.  Cranial Nerves II  - XII - II - Visual field intact OU. III, IV, VI - Extraocular movements intact. V - Facial sensation intact bilaterally. VII - Facial movement intact bilaterally. VIII - Hearing & vestibular intact bilaterally. X - Palate elevates symmetrically. XI - Chin turning & shoulder shrug intact bilaterally. XII - Tongue protrusion intact.  Motor Strength - The patient's strength was normal in all extremities except subtle left hand dexterity difficulty and pronator drift was absent.  Bulk was normal and fasciculations were absent.   Motor Tone - Muscle tone was assessed at the neck and appendages and was normal.  Reflexes - The patient's reflexes were 1+ in all extremities and she had no pathological reflexes.  Sensory - Light touch, temperature/pinprick were assessed and were symmetrical.    Coordination - The patient had normal movements in the hands with no ataxia or dysmetria.  Tremor was absent.  Gait and Station - deferred.   ASSESSMENT/PLAN Carrie Warner is a 71 y.o. female with history of hypertension, hyperlipidemia and DM2, who [facility] with weakness, AMS, left facial droop and drooling. She did not receive IV t-PA due to arriving outside of the treatment window.   Stroke: right PLIC / thalamus acute infarct, likely small vessel disease  Resultant  Left hand subtle dexterity difficulty  CT head: no stroke   MRI head: Small areas of acute infarct in the posterior limb internal capsule on the right and in the right anterior thalamus  CTA head/neck:  Extensive intracranial atherosclerosis/stenosis, greatest in the left paraclinoid ICA segment where it is moderate to severe.  2D Echo: EF 60-65%. No source of embolus.  LDL 126  HgbA1c 8.5  Lovenox 40 mg sq daily for VTE prophylaxis Diet heart healthy/carb modified Room service appropriate? Yes; Fluid consistency: Thin  ASA 81mg  prior to admission, now on aspirin 325 mg daily and clopidogrel 75 mg daily. Continue  DAPT for 3 months and then plavix alone due to intracranial stenosis  Patient counseled to be compliant with her antithrombotic medications  Ongoing aggressive stroke risk factor management  Therapy recommendations:  Home health PT;Supervision/Assistance - 24 hour  Disposition:  pending  Hypertension  Stable Permissive hypertension (OK if < 220/120) but gradually normalize in 5-7 days Long-term BP goal normotensive  Hyperlipidemia  Home meds: none  LDL 126, goal < 70  Add atorvastatin 40mg  PO daily  Continue statin at discharge  Diabetes  HgbA1c 8.5, goal < 7.0  Uncontrolled  Hyperglycemia  On lantus  SSI  Close PCP follow up  Other Stroke Risk Factors  Advanced age  Obesity, Body mass index is 33.53 kg/m., recommend weight loss, diet and exercise as appropriate   Other Active Problems  none  Hospital day # 1  Neurology will sign off. Please call with questions. Pt will follow up with Darrol Angel, NP, at [facility] in about 6 weeks. Thanks for the consult.  Marvel Plan, MD PhD Stroke Neurology 07/21/2017 9:39 PM   To contact Stroke Continuity provider, please refer to [email or web address]. After hours, contact General Neurology

## 2017-07-22 LAB — GLUCOSE, CAPILLARY
GLUCOSE-CAPILLARY: 196 mg/dL — AB (ref 65–99)
GLUCOSE-CAPILLARY: 261 mg/dL — AB (ref 65–99)

## 2017-07-22 MED ORDER — CLOPIDOGREL BISULFATE 75 MG PO TABS: 75.0000 mg | ORAL_TABLET | Freq: Every day | ORAL | 0 refills | Status: DC

## 2017-07-22 MED ORDER — ASPIRIN 325 MG PO TBEC: 325.0000 mg | DELAYED_RELEASE_TABLET | Freq: Every day | ORAL | 0 refills | Status: DC

## 2017-07-22 MED ORDER — ATORVASTATIN CALCIUM 40 MG PO TABS: 40.0000 mg | ORAL_TABLET | Freq: Every day | ORAL | 0 refills | Status: DC

## 2017-07-22 MED ORDER — LANTUS SOLOSTAR 100 UNIT/ML ~~LOC~~ SOPN: 55.0000 [IU] | PEN_INJECTOR | Freq: Every day | SUBCUTANEOUS | 11 refills | Status: DC

## 2017-07-22 MED ORDER — METFORMIN HCL ER 500 MG PO TB24: 1000.0000 mg | ORAL_TABLET | Freq: Every day | ORAL | 0 refills | Status: DC

## 2017-07-22 NOTE — Progress Notes (Signed)
Occupational Therapy Treatment Patient Details Name: Carrie Warner MRN: [id number] DOB: 02-21-1946 Today's Date: 07/22/2017    History of present illness Pt is a 71 y/o female admitted secondary to CVA. MRI revealed infarct in R anterior thalamus and posterior limb of internal capsule. PMH includes HTN and DM.    OT comments  Pt progressing towards goals, requires verbal cues for safety as Pt continues to demonstrate decreased awareness of balance deficits and decreased awareness /understanding of use of RW. Pt requires MinA to maintain standing balance during ADL tasks. Issued theraputty/HEP handout and educated Pt on LUE theraputty exercises with Pt return demonstrating with good understanding, min verbal cues throughout exercise completion. Pt will continue to benefit from OT services in acute and post acute settings to maximize Pt's LUE functional use, safety, and independence with ADLs and functional mobility.    Follow Up Recommendations  Home health OT;Supervision/Assistance - 24 hour    Equipment Recommendations  3 in 1 bedside commode          Precautions / Restrictions Precautions Precautions: Fall Restrictions Weight Bearing Restrictions: No       Mobility Bed Mobility               General bed mobility comments: OOB in recliner   Transfers Overall transfer level: Needs assistance Equipment used: Rolling walker (2 wheeled) Transfers: Sit to/from Stand Sit to Stand: Min assist         General transfer comment: assist to rise and steady; verbal cues for hand placement     Balance Overall balance assessment: Needs assistance Sitting-balance support: No upper extremity supported;Feet supported Sitting balance-Leahy Scale: Good     Standing balance support: During functional activity Standing balance-Leahy Scale: Poor Standing balance comment:  LOB without support                           ADL either performed or assessed with clinical  judgement   ADL Overall ADL's : Needs assistance/impaired     Grooming: Standing;Minimal assistance;Wash/dry hands;Oral care Grooming Details (indicate cue type and reason): steady assist for balance during bil UE task                  Toilet Transfer: Minimal Investment banker, corporate Details (indicate cue type and reason): assist for rise/descent  Toileting- Clothing Manipulation and Hygiene: Min guard;Sit to/from stand       Functional mobility during ADLs: Minimal assistance;Rolling walker;Cueing for safety;Cueing for sequencing General ADL Comments: Pt continues to demonstrate poor insight into balance deficits, with LOB posteriorly while standing at sink and completing bil UE task, MinA to maintain balance and steady during task completion      Vision Baseline Vision/History: Wears glasses                Cognition Arousal/Alertness: Awake/alert Behavior During Therapy: WFL for tasks assessed/performed Overall Cognitive Status: Impaired/Different from baseline Area of Impairment: Memory;Safety/judgement;Awareness;Problem solving                     Memory: Decreased short-term memory;Decreased recall of precautions   Safety/Judgement: Decreased awareness of safety;Decreased awareness of deficits Awareness: Emergent Problem Solving: Slow processing;Difficulty sequencing;Requires verbal cues;Requires tactile cues General Comments: decreased safety with use of RW, attempting to leave RW at sink and ambulate to bathroom without, requires verbal cues to correct/educate on use of RW         Exercises Other Exercises Other Exercises: Theraputty  exercises completed with LUE; theraputty and handout issued and reviewed with Pt return demonstrating                 Pertinent Vitals/ Pain       Pain Assessment: No/denies pain                                                          Frequency  Min  2X/week        Progress Toward Goals  OT Goals(current goals can now be found in the care plan section)  Progress towards OT goals: Progressing toward goals  Acute Rehab OT Goals Patient Stated Goal: to go home  OT Goal Formulation: With patient Time For Goal Achievement: 08/04/17 Potential to Achieve Goals: Good  Plan Discharge plan remains appropriate                    AM-PAC PT "6 Clicks" Daily Activity     Outcome Measure   Help from another person eating meals?: None Help from another person taking care of personal grooming?: A Little Help from another person toileting, which includes using toliet, bedpan, or urinal?: A Little Help from another person bathing (including washing, rinsing, drying)?: A Little Help from another person to put on and taking off regular upper body clothing?: A Little Help from another person to put on and taking off regular lower body clothing?: A Little 6 Click Score: 19    End of Session Equipment Utilized During Treatment: Gait belt;Rolling walker  OT Visit Diagnosis: Other abnormalities of gait and mobility (R26.89);Muscle weakness (generalized) (M62.81);Other symptoms and signs involving cognitive function   Activity Tolerance Patient tolerated treatment well   Patient Left in chair;with call bell/phone within reach;with family/visitor present   Nurse Communication Mobility status        Time: 1610-9604 OT Time Calculation (min): 24 min  Charges: OT General Charges $OT Visit: 1 Procedure OT Treatments $Self Care/Home Management : 8-22 mins  Marcy Siren, OT Pager [phone] 07/22/2017    Orlando Penner 07/22/2017, 10:32 AM

## 2017-07-22 NOTE — Evaluation (Signed)
SLP Cancellation Note  Patient Details Name: Camill Diprima MRN: [id number] DOB: Jun 05, 1946   Cancelled treatment:       Reason Eval/Treat Not Completed:  (RN reports family wants pt to rest, will reattempt at later time)  Donavan Burnet, MS [facility] SLP [phone]  Chales Abrahams 07/22/2017, 10:53 AM

## 2017-07-22 NOTE — Progress Notes (Signed)
Inpatient Diabetes Program Recommendations  AACE/ADA: New Consensus Statement on Inpatient Glycemic Control (2015)  Target Ranges:  Prepandial:   less than 140 mg/dL      Peak postprandial:   less than 180 mg/dL (1-2 hours)      Critically ill patients:  140 - 180 mg/dL   Results for Carrie Warner, Carrie Warner (MRN [id number]) as of 07/22/2017 08:40  Ref. Range 07/21/2017 06:08 07/21/2017 11:28 07/21/2017 16:52 07/21/2017 21:46  Glucose-Capillary Latest Ref Range: 65 - 99 mg/dL 063 (H) 016 (H) 010 (H) 259 (H)   Results for Carrie Warner, Carrie Warner (MRN [id number]) as of 07/22/2017 08:40  Ref. Range 07/22/2017 06:21  Glucose-Capillary Latest Ref Range: 65 - 99 mg/dL 202 (H)    Home DM Meds: Lantus 50 units daily       Novolog daily??       Metformin 1000 mg BID  Current Insulin Orders: Lantus 25 units daily      Novolog Sensitive Correction Scale/ SSI (0-9 units) TID AC + HS       MD- Note Lantus 25 units daily started yesterday AM.  CBG still a bit elevated this AM.  Please consider the following adjustments:  1. Increase Lantus to 30 units daily  2. Start low dose Novolog Meal Coverage: Novolog 3 units TID with meals (hold if pt eats <50% of meal)      --Will follow patient during hospitalization--  Ambrose Finland RN, MSN, CDE Diabetes Coordinator Inpatient Glycemic Control Team Team Pager: [phone] (8a-5p)

## 2017-07-22 NOTE — Progress Notes (Signed)
RN discussed discharge instructions with patient including changes to medications, patient and family verbalized understanding, understand f/u with pcp, daughter states this was already scheduled, f/u with neurology. Neuro assessment unchanged. Patient is waiting for equipment to be delivered to room.

## 2017-07-22 NOTE — Care Management Note (Signed)
Case Management Note  Patient Details  Name: Carrie Warner MRN: [id number] Date of Birth: 1946/04/23  Subjective/Objective:                    Action/Plan: Pt discharging home with her daughter to: [place]. Pt with orders for [facility] services. CM met with the patient and provided her a list of [facility] agencies. They selected [place]. Santiago Glad with [facility] notified.  Pt with orders for walker and 3 in 1. Santiago Glad with [facility] DME notified and equipment will be delivered to the room.  Daughter to provide transportation home.   Expected Discharge Date:  07/22/17               Expected Discharge Plan:  Home/Self Care  In-House Referral:     Discharge planning Services  CM Consult  Post Acute Care Choice:  Durable Medical Equipment, Home Health Choice offered to:  Patient, Adult Children  DME Arranged:  3-N-1, Walker rolling DME Agency:  NA, [place] Arranged:  PT, OT [place] Agency:  [place]  Status of Service:  Completed, signed off  If discussed at [place] of Stay Meetings, dates discussed:    Additional Comments:  Pollie Friar, RN 07/22/2017, 11:18 AM

## 2017-07-22 NOTE — Discharge Summary (Signed)
Physician Discharge Summary  Carrie Warner  ZOX:[id number]  DOB: 20-Apr-1946  DOA: 07/19/2017 PCP: Jackie Plum, MD  Admit date: 07/19/2017 Discharge date: 07/22/2017  Admitted From: Home Disposition:  Home  Recommendations for Outpatient Follow-up:  1. Follow up with PCP in 1 weeks 2. Please obtain BMP/CBC in one week to monitor renal function and hemoglobin 3. Follow-up with neurology in 6 weeks 4. Continued the DAPT for 3 months and then Plavix alone  Home Health: Home health PT/OT Equipment/Devices: Bedside commode, rolling walker  Discharge Condition: Stable  CODE STATUS: Full code  Diet recommendation: Heart Healthy / Carb Modified  Brief/Interim Summary: For full details see H&P but in brief, 71 year old female with medical history of type 2 diabetes mellitus, hypertension, hyperlipidemia was brought into the ED by her daughter due to left facial droop and drooling accompanied by inability to bear weight. On ED evaluation CT of the head was negative but subsequent MRI show acute right ischemic infarct in the posterior limb of the internal capsule anterior thalamus. Patient was admitted for his stroke evaluation. Neurology was consulted patient was placed on aspirin and Plavix. 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 Patient was started on atorvastatin. Other workup was negative, patient was evaluated by PT recommended home health physical therapy. Patient symptoms has improved and clinically doing well. She will be discharge home to follow up with PCP in 1 week.  Subjective: Patient seen and examined on the day of discharge, she is reporting left facial twitching that is on and off. Denies chest pain, shortness of breath, dizziness, palpitations and further weakness. No acute events overnight. Remains afebrile. Ambulating with PT without issues  Discharge Diagnoses/Hospital Course:  Acute ischemic CVA MRI show  acute right ischemic infarct in the posterior limb of the internal capsule anterior thalamus. Moderate to severe left paraclinoid ICA stenosis with extensive intracranial atherosclerosis.   Patient was seen by neurology, was placed on dual antiplatelet therapy, aspirin 325 and Plavix 75, this will be continued for 3 months, then Plavix alone Hgb A1C 8.5 Lipid panel - LDL elevated 126 patient was started on atorvastatin Patient will follow up with PCP and neurology team Home health physical therapy and occupational therapy has been arranged.  Insulin-dependent type 2 diabetes mellitus Hgb A1C 8.5 Patient on Lantus at home with increased dose to 55 units from 50 Metformin was changed to ER formulation given GI symptoms. Patient was advised to discuss with PCP for further recommendations  Hypertension Permeative hypertension was allowed, BP will normalize in 5-7 days Long-term BP control needed We'll hold medications until seen by PCP, likely will benefit from Norvasc or ACE inhibitor  Orthostatic hypotension - resolved Diuretics was discontinued, and we'll hold for now until seen by PCP  All other chronic medical condition were stable during the hospitalization.  Patient was seen by physical therapy, recommending home health PT On the day of the discharge the patient's vitals were stable, and no other acute medical condition were reported by patient. Patient was felt safe to be discharge to home  Discharge Instructions  You were cared for by a hospitalist during your hospital stay. If you have any questions about your discharge medications or the care you received while you were in the hospital after you are discharged, you can call the unit and asked to speak with the hospitalist on call if the hospitalist that took care of you is not available. Once you are discharged, your primary care  physician will handle any further medical issues. Please note that NO REFILLS for any discharge  medications will be authorized once you are discharged, as it is imperative that you return to your primary care physician (or establish a relationship with a primary care physician if you do not have one) for your aftercare needs so that they can reassess your need for medications and monitor your lab values.  Discharge Instructions    Ambulatory referral to Neurology    Complete by:  As directed    Follow up with stroke clinic Darrol Angel preferred, if not available, then consider Sylvie Farrier, [facility] or Lucia Gaskins whoever is available) at [facility] in about 6-8 weeks. Thanks.   Call MD for:  difficulty breathing, headache or visual disturbances    Complete by:  As directed    Call MD for:  extreme fatigue    Complete by:  As directed    Call MD for:  hives    Complete by:  As directed    Call MD for:  persistant dizziness or light-headedness    Complete by:  As directed    Call MD for:  persistant nausea and vomiting    Complete by:  As directed    Call MD for:  redness, tenderness, or signs of infection (pain, swelling, redness, odor or green/yellow discharge around incision site)    Complete by:  As directed    Call MD for:  severe uncontrolled pain    Complete by:  As directed    Call MD for:  temperature >100.4    Complete by:  As directed    Diet - low sodium heart healthy    Complete by:  As directed    Diet Carb Modified    Complete by:  As directed    Increase activity slowly    Complete by:  As directed      Allergies as of 07/22/2017   No Known Allergies     Medication List    STOP taking these medications   hydrochlorothiazide 25 MG tablet Commonly known as:  HYDRODIURIL   ibuprofen 800 MG tablet Commonly known as:  ADVIL,MOTRIN   insulin aspart 100 unit/mL injection Commonly known as:  novoLOG   metFORMIN 1000 MG tablet Commonly known as:  GLUCOPHAGE Replaced by:  metFORMIN 500 MG 24 hr tablet     TAKE these medications   aspirin 325 MG EC tablet Take 1  tablet (325 mg total) by mouth daily.   atorvastatin 40 MG tablet Commonly known as:  LIPITOR Take 1 tablet (40 mg total) by mouth daily at 6 PM.   clopidogrel 75 MG tablet Commonly known as:  PLAVIX Take 1 tablet (75 mg total) by mouth daily.   LANTUS SOLOSTAR 100 UNIT/ML Solostar Pen Generic drug:  Insulin Glargine Inject 55 Units into the skin daily. What changed:  how much to take   metFORMIN 500 MG 24 hr tablet Commonly known as:  GLUCOPHAGE-XR Take 2 tablets (1,000 mg total) by mouth daily with breakfast. Replaces:  metFORMIN 1000 MG tablet            Durable Medical Equipment        Start     Ordered   07/22/17 1054  For home use only DME Walker rolling  [facility])  Once    Question:  Patient needs a walker to treat with the following condition  Answer:  CVA (cerebral vascular accident) (HCC)   07/22/17 1054     Follow-up  Information    Jackie Plum, MD Follow up.   Specialty:  Internal Medicine Why:  please call to arrange follow up appointment in 1 week Contact information: [address] [place] [address] [phone]        Nilda Riggs, NP. Schedule an appointment as soon as possible for a visit in 6 week(s).   Specialty:  Family Medicine Contact information: [address] [place] [address] [phone]          No Known Allergies  Consultations:  Neurology   Procedures/Studies: Ct Angio Head W Or Wo Contrast  Result Date: 07/20/2017 CLINICAL DATA:  71 y/o  F; follow-up of acute stroke. EXAM: CT ANGIOGRAPHY HEAD AND NECK TECHNIQUE: Multidetector CT imaging of the head and neck was performed using the standard protocol during bolus administration of intravenous contrast. Multiplanar CT image reconstructions and MIPs were obtained to evaluate the vascular anatomy. Carotid stenosis measurements (when applicable) are obtained utilizing NASCET criteria, using the distal internal carotid diameter  as the denominator. CONTRAST:  50 cc Isovue 370 COMPARISON:  07/19/2017 MRI of the brain. FINDINGS: CT HEAD FINDINGS Brain: Small lucency within right anterior thalamus/ posterior limb of internal capsule corresponding to infarction on the prior MRI of the brain. No evidence for new large territory infarct, acute intracranial hemorrhage, or focal mass effect. Small chronic lacunar infarct within the left cerebellar hemisphere, left caudate head, and left thalamus. Stable background of chronic microvascular ischemic changes and parenchymal volume loss of the brain. Vascular: As below. Skull: Normal. Negative for fracture or focal lesion. Sinuses: Imaged portions are clear. Orbits: No acute finding. Review of the MIP images confirms the above findings CTA NECK FINDINGS Aortic arch: Mild calcific atherosclerosis of the aortic arch. Calcific atherosclerosis of left subclavian artery origin with mild 40% stenosis. Right carotid system: No evidence of dissection, stenosis (50% or greater) or occlusion. Mild non stenotic calcification of right carotid bifurcation. Left carotid system: No evidence of dissection, stenosis (50% or greater) or occlusion. Mild non stenotic calcification of left carotid bifurcation. Vertebral arteries: Right dominant. No evidence of dissection, stenosis (50% or greater) or occlusion. 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 Disc and facet degenerative changes without high-grade bony canal or foraminal stenosis. Other neck: Negative. Upper chest: Negative. Review of the MIP images confirms the above findings CTA HEAD FINDINGS Anterior circulation: No large vessel occlusion, aneurysm, or vascular malformation identified. Severe calcific atherosclerosis of carotid siphons with moderate right and moderate to severe left paraclinoid stenosis (series 12, image 121). Multiple segments of mild-to-moderate stenosis and bilateral M1 and proximal  M2 branches. Severe stenosis of proximal left A1. Posterior circulation: No large vessel occlusion, aneurysm, or vascular malformation identified. Non stenotic calcified plaque of the right V4 segment. Mild basilar irregularity with short segments of mild stenosis in the mid and upper artery. Segments of stenosis and bilateral P1 arteries, moderate on the right and mild on the left. Venous sinuses: As permitted by contrast timing, patent. Anatomic variants: Small anterior communicating artery. No posterior communicating artery is identified, hypoplastic or absent. Delayed phase: No abnormal intracranial enhancement. Review of the MIP images confirms the above findings IMPRESSION: CT head: 1. Small lucency within right anterior thalamus/posterior limb of internal capsule corresponding to infarct on prior MRI. 2. No new acute intracranial abnormality identified. 3. Stable background of chronic microvascular ischemic changes, parenchymal volume loss, and chronic lacunar infarcts in the brain. CTA neck: 1.  Patent carotid and vertebral arteries. No large vessel occlusion, aneurysm, or significant stenosis is identified. 2. Aortic atherosclerosis with mild 40% stenosis of left subclavian artery origin. CTA head: 1. Patent circle of Willis. No large vessel occlusion, aneurysm, or vascular malformation identified. 2. Extensive intracranial atherosclerosis with multiple areas of stenosis greatest in the left paraclinoid ICA segment where it is moderate to severe. Electronically Signed   By: Mitzi Hansen M.D.   On: 07/20/2017 23:28   Dg Chest 1 View  Result Date: 07/19/2017 CLINICAL DATA:  Acute onset of generalized weakness. Confusion. Initial encounter. EXAM: CHEST 1 VIEW COMPARISON:  None. FINDINGS: The lungs are hypoexpanded but appear grossly clear. There is no evidence of focal opacification, pleural effusion or pneumothorax. The cardiomediastinal silhouette is within normal limits. No acute osseous  abnormalities are seen. IMPRESSION: Lungs hypoexpanded but grossly clear. Electronically Signed   By: Roanna Raider M.D.   On: 07/19/2017 03:25   Ct Head Wo Contrast  Result Date: 07/19/2017 CLINICAL DATA:  Altered level of consciousness unexplained. EXAM: CT HEAD WITHOUT CONTRAST TECHNIQUE: Contiguous axial images were obtained from the base of the skull through the vertex without intravenous contrast. COMPARISON:  None. 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 The skullbase. No large vascular territory infarct, intracranial hemorrhage, midline shift or edema. No intra-axial mass or extra-axial fluid collections are apparent. Vascular: Moderate atherosclerosis of the carotid siphons. No hyperdense vessels. Skull: Negative for fracture. Sinuses/Orbits: No acute finding. Other: None. 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 No acute intracranial abnormality is identified. Electronically Signed   By: Tollie Eth M.D.   On: 07/19/2017 03:28   Ct Angio Neck W Or Wo Contrast  Result Date: 07/20/2017 CLINICAL DATA:  72 y/o  F; follow-up of acute stroke. EXAM: CT ANGIOGRAPHY HEAD AND NECK TECHNIQUE: Multidetector CT imaging of the head and neck was performed using the standard protocol during bolus administration of intravenous contrast. Multiplanar CT image reconstructions and MIPs were obtained to evaluate the vascular anatomy. Carotid stenosis measurements (when applicable) are obtained utilizing NASCET criteria, using the distal internal carotid diameter as the denominator. CONTRAST:  50 cc Isovue 370 COMPARISON:  07/19/2017 MRI of the brain. FINDINGS: CT HEAD  FINDINGS Brain: Small lucency within right anterior thalamus/ posterior limb of internal capsule corresponding to infarction on the prior MRI of the brain. No evidence for new large territory infarct, acute intracranial hemorrhage, or focal mass effect. Small chronic lacunar infarct within the left cerebellar hemisphere, left caudate head, and left thalamus. Stable background of chronic microvascular ischemic changes and parenchymal volume loss of the brain. Vascular: As below. Skull: Normal. Negative for fracture or focal lesion. Sinuses: Imaged portions are clear. Orbits: No acute finding. Review of the MIP images confirms the above findings CTA NECK FINDINGS Aortic arch: Mild calcific atherosclerosis of the aortic arch. Calcific atherosclerosis of left subclavian artery origin with mild 40% stenosis. Right carotid system: No evidence of dissection, stenosis (50% or greater) or occlusion. Mild non stenotic calcification of right carotid bifurcation. Left carotid system: No evidence of dissection, stenosis (50% or greater) or occlusion. Mild non stenotic calcification of left carotid bifurcation. Vertebral arteries: Right dominant. No evidence of dissection, stenosis (50% or greater) or occlusion. Skeleton: Ossification of anterior  longitudinal ligament across many segments compatible with DISH. Disc and facet degenerative changes without high-grade bony canal or foraminal stenosis. Other neck: Negative. Upper chest: Negative. Review of the MIP images confirms the above findings CTA HEAD FINDINGS Anterior circulation: No large vessel occlusion, aneurysm, or vascular malformation identified. Severe calcific atherosclerosis of carotid siphons with moderate right and moderate to severe left paraclinoid stenosis (series 12, image 121). Multiple segments of mild-to-moderate stenosis and bilateral M1 and proximal M2 branches. Severe stenosis of proximal left A1. Posterior circulation: No large vessel occlusion, aneurysm,  or vascular malformation identified. Non stenotic calcified plaque of the right V4 segment. Mild basilar irregularity with short segments of mild stenosis in the mid and upper artery. Segments of stenosis and bilateral P1 arteries, moderate on the right and mild on the left. Venous sinuses: As permitted by contrast timing, patent. Anatomic variants: Small anterior communicating artery. No posterior communicating artery is identified, hypoplastic or absent. Delayed phase: No abnormal intracranial enhancement. Review of the MIP images confirms the above findings IMPRESSION: CT head: 1. Small lucency within right anterior thalamus/posterior limb of internal capsule corresponding to infarct on prior MRI. 2. No new acute intracranial abnormality identified. 3. Stable background of chronic microvascular ischemic changes, parenchymal volume loss, and chronic lacunar infarcts in the brain. CTA neck: 1. Patent carotid and vertebral arteries. No large vessel occlusion, aneurysm, or significant stenosis is identified. 2. Aortic atherosclerosis with mild 40% stenosis of left subclavian artery origin. CTA head: 1. Patent circle of Willis. No large vessel occlusion, aneurysm, or vascular malformation identified. 2. Extensive intracranial atherosclerosis with multiple areas of stenosis greatest in the left paraclinoid ICA segment where it is moderate to severe. Electronically Signed   By: Mitzi Hansen M.D.   On: 07/20/2017 23:28   Mr Brain Wo Contrast  Result Date: 07/19/2017 CLINICAL DATA:  Altered level consciousness, unexplained. Hyperlipidemia, hypertension, diabetes EXAM: MRI HEAD WITHOUT CONTRAST TECHNIQUE: Multiplanar, multiecho pulse sequences of the brain and surrounding structures were obtained without intravenous contrast. COMPARISON:  CT head 07/19/2017 FINDINGS: Brain: Small areas of acute infarct in the right internal capsule posteriorly and anterior thalamus. No other areas of acute infarct. Mild  chronic microvascular ischemic change in the white matter. Chronic infarct left cerebellum. Ventricle size normal. Negative for hemorrhage or mass Image quality degraded by motion. Vascular: Normal arterial flow voids. 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 Mild chronic microvascular ischemic change in the white matter. Electronically Signed   By: Marlan Palau M.D.   On: 07/19/2017 19:50   ECHO 07/21/17 ------------------------------------------------------------------- Study Conclusions  - Left ventricle: The cavity size was normal. Wall thickness was   normal. Systolic function was normal. The estimated ejection   fraction was in the range of 55% to 60%. Wall motion was normal;   there were no regional wall motion abnormalities. Doppler   parameters are consistent with abnormal left ventricular   relaxation (grade 1 diastolic dysfunction). - Mitral valve: Calcified annulus. There was mild regurgitation.  Impressions:  - Normal LV systolic function; mild diastolic dysfunction; mild MR.   Discharge Exam: Vitals:   07/22/17 0626 07/22/17 0920  BP: (!) 150/61 (!) 151/71  Pulse: 68   Resp: 18 16  Temp: 98.2 F (36.8 C)   SpO2: 97% 98%   Vitals:   07/21/17 2143 07/22/17 0030 07/22/17 1610 07/22/17 0920  BP: (!) 152/82 138/82 (!) 150/61 (!) 151/71  Pulse:  67 68   Resp: 18 18 18 16   Temp: 98.1 F (36.7 C) 98.2 F (36.8 C) 98.2 F (36.8 C)   TempSrc: Oral Oral Oral   SpO2: 100% 97% 97% 98%  Weight:      Height:        General: Pt is alert, awake, not in acute distress Cardiovascular: RRR, S1/S2 +, no rubs, no gallops Respiratory: CTA bilaterally, no wheezing, no rhonchi Abdominal: Soft, NT, ND, bowel sounds + Neurology: No facial droop, cranial nerves intact. Extremities: no edema, no cyanosis, left  lower extremity strength 4/5   The results of significant diagnostics from this hospitalization (including imaging, microbiology, ancillary and laboratory) are listed below for reference.     Microbiology: No results found for this or any previous visit (from the past 240 hour(s)).   Labs: BNP (last 3 results) No results for input(s): BNP in the last 8760 hours. Basic Metabolic Panel:  Recent Labs Lab 07/19/17 0237  NA 136  K 4.5  CL 99*  CO2 28  GLUCOSE 200*  BUN 19  CREATININE 1.23*  CALCIUM 9.1   Liver Function Tests:  Recent Labs Lab 07/19/17 0237  AST 30  ALT 12*  ALKPHOS 74  BILITOT 0.8  PROT 7.3  ALBUMIN 3.4*   No results for input(s): LIPASE, AMYLASE in the last 168 hours.  Recent Labs Lab 07/19/17 0330  AMMONIA 26   CBC:  Recent Labs Lab 07/19/17 0237 07/19/17 1157  WBC 9.4  --   NEUTROABS  --  2.6  HGB 12.1  --   HCT 36.8  --   MCV 92.0  --   PLT 179  --    Cardiac Enzymes: No results for input(s): CKTOTAL, CKMB, CKMBINDEX, TROPONINI in the last 168 hours. BNP: Invalid input(s): POCBNP CBG:  Recent Labs Lab 07/21/17 0608 07/21/17 1128 07/21/17 1652 07/21/17 2146 07/22/17 0621  GLUCAP 244* 286* 273* 259* 196*   D-Dimer No results for input(s): DDIMER in the last 72 hours. Hgb A1c  Recent Labs  07/19/17 1157  HGBA1C 8.5*   Lipid Profile  Recent Labs  07/21/17 0329  CHOL 182  HDL 32*  LDLCALC 126*  TRIG 119  CHOLHDL 5.7   Thyroid function studies No results for input(s): TSH, T4TOTAL, T3FREE, THYROIDAB in the last 72 hours.  Invalid input(s): FREET3 Anemia work up No results for input(s): VITAMINB12, FOLATE, FERRITIN, TIBC, IRON, RETICCTPCT in the last 72 hours. Urinalysis    Component Value Date/Time   COLORURINE YELLOW 07/19/2017 0420   APPEARANCEUR HAZY (A) 07/19/2017 0420   LABSPEC 1.018 07/19/2017 0420   PHURINE 5.0 07/19/2017 0420   GLUCOSEU NEGATIVE 07/19/2017 0420   HGBUR NEGATIVE 07/19/2017 0420    BILIRUBINUR NEGATIVE 07/19/2017 0420   KETONESUR NEGATIVE 07/19/2017 0420   PROTEINUR NEGATIVE 07/19/2017 0420   NITRITE NEGATIVE 07/19/2017 0420   LEUKOCYTESUR TRACE (A) 07/19/2017 0420   Sepsis Labs Invalid input(s): PROCALCITONIN,  WBC,  LACTICIDVEN Microbiology No results found for this or any previous visit (from the past 240 hour(s)).   Time coordinating discharge: 35 minutes  SIGNED:  Latrelle Dodrill, MD  [facility] 07/22/2017, 10:54 AM  Pager please text page via  [email or web address] Password [email or web address]

## 2017-07-31 ENCOUNTER — Encounter (HOSPITAL_COMMUNITY): Payer: Self-pay

## 2017-07-31 ENCOUNTER — Telehealth: Payer: Self-pay | Admitting: Neurology

## 2017-07-31 ENCOUNTER — Other Ambulatory Visit: Payer: Self-pay

## 2017-07-31 ENCOUNTER — Emergency Department (HOSPITAL_COMMUNITY): Payer: Medicare (Managed Care)

## 2017-07-31 DIAGNOSIS — R4781 Slurred speech: Secondary | ICD-10-CM | POA: Diagnosis present

## 2017-07-31 DIAGNOSIS — E119 Type 2 diabetes mellitus without complications: Secondary | ICD-10-CM | POA: Diagnosis not present

## 2017-07-31 DIAGNOSIS — I10 Essential (primary) hypertension: Secondary | ICD-10-CM | POA: Insufficient documentation

## 2017-07-31 DIAGNOSIS — E785 Hyperlipidemia, unspecified: Secondary | ICD-10-CM | POA: Insufficient documentation

## 2017-07-31 DIAGNOSIS — R471 Dysarthria and anarthria: Secondary | ICD-10-CM | POA: Insufficient documentation

## 2017-07-31 DIAGNOSIS — R4182 Altered mental status, unspecified: Secondary | ICD-10-CM | POA: Insufficient documentation

## 2017-07-31 DIAGNOSIS — R262 Difficulty in walking, not elsewhere classified: Secondary | ICD-10-CM | POA: Insufficient documentation

## 2017-07-31 LAB — COMPREHENSIVE METABOLIC PANEL
ALK PHOS: 92 U/L (ref 38–126)
ALT: 15 U/L (ref 14–54)
AST: 21 U/L (ref 15–41)
Albumin: 3.4 g/dL — ABNORMAL LOW (ref 3.5–5.0)
Anion gap: 7 (ref 5–15)
BUN: 8 mg/dL (ref 6–20)
CHLORIDE: 103 mmol/L (ref 101–111)
CO2: 29 mmol/L (ref 22–32)
CREATININE: 0.93 mg/dL (ref 0.44–1.00)
Calcium: 9.4 mg/dL (ref 8.9–10.3)
GFR, EST NON AFRICAN AMERICAN: 60 mL/min — AB (ref >60)
Glucose, Bld: 126 mg/dL — ABNORMAL HIGH (ref 65–99)
POTASSIUM: 3.7 mmol/L (ref 3.5–5.1)
SODIUM: 139 mmol/L (ref 135–145)
Total Bilirubin: 0.4 mg/dL (ref 0.3–1.2)
Total Protein: 7.7 g/dL (ref 6.5–8.1)

## 2017-07-31 LAB — PROTIME-INR
INR: 0.94
Prothrombin Time: 12.5 seconds (ref 11.4–15.2)

## 2017-07-31 LAB — I-STAT CHEM 8, ED
BUN: 11 mg/dL (ref 6–20)
CHLORIDE: 102 mmol/L (ref 101–111)
Calcium, Ion: 1.19 mmol/L (ref 1.15–1.40)
Creatinine, Ser: 0.8 mg/dL (ref 0.44–1.00)
GLUCOSE: 126 mg/dL — AB (ref 65–99)
HEMATOCRIT: 42 % (ref 36.0–46.0)
HEMOGLOBIN: 14.3 g/dL (ref 12.0–15.0)
POTASSIUM: 3.8 mmol/L (ref 3.5–5.1)
Sodium: 142 mmol/L (ref 135–145)
TCO2: 27 mmol/L (ref 0–100)

## 2017-07-31 LAB — CBC
HEMATOCRIT: 39.6 % (ref 36.0–46.0)
HEMOGLOBIN: 13 g/dL (ref 12.0–15.0)
MCH: 30 pg (ref 26.0–34.0)
MCHC: 32.8 g/dL (ref 30.0–36.0)
MCV: 91.5 fL (ref 78.0–100.0)
Platelets: 220 10*3/uL (ref 150–400)
RBC: 4.33 MIL/uL (ref 3.87–5.11)
RDW: 13.1 % (ref 11.5–15.5)
WBC: 8.2 10*3/uL (ref 4.0–10.5)

## 2017-07-31 LAB — DIFFERENTIAL
Basophils Absolute: 0 10*3/uL (ref 0.0–0.1)
Basophils Relative: 0 %
Eosinophils Absolute: 0.5 10*3/uL (ref 0.0–0.7)
Eosinophils Relative: 6 %
LYMPHS PCT: 43 %
Lymphs Abs: 3.5 10*3/uL (ref 0.7–4.0)
MONO ABS: 0.6 10*3/uL (ref 0.1–1.0)
MONOS PCT: 7 %
NEUTROS ABS: 3.6 10*3/uL (ref 1.7–7.7)
Neutrophils Relative %: 44 %

## 2017-07-31 LAB — APTT: aPTT: 32 seconds (ref 24–36)

## 2017-07-31 LAB — I-STAT TROPONIN, ED: TROPONIN I, POC: 0.01 ng/mL (ref 0.00–0.08)

## 2017-07-31 NOTE — ED Triage Notes (Signed)
Pt from home with complaint of stroke sx. LSN 2 days ago. Pt has been having right hand tingling x 3 days. Pt had cva earlier this month with left side weakness. Earlier today pt also had problems getting out of the car and headache. VSS. Pt alert and oriented x 4. No drift, facial droop and equal grips on both sides upon exam. Daughter states that she was at the neuro this morning and pt had some slurred speech.

## 2017-07-31 NOTE — Telephone Encounter (Signed)
If there is an acute change comparing with the condition on discharge, she should go to ER or call 911 to rule out new stroke. Please let the daughter know. Thank.  Marvel Plan, MD PhD Stroke Neurology 07/31/2017 4:48 PM

## 2017-07-31 NOTE — Telephone Encounter (Signed)
Rn call patients daughter Kathrine Haddock back about her mom having staggering to the left, tingling in hands, and stuttering  her words. Pts daughter the tingling has been happening for 3 days. Verler stated when pt was discharge she did not have these symptoms. Rn stated per DR, Roda Shutters note he advised pt to seek nearest ED. Pts daughter verbalized understanding.

## 2017-07-31 NOTE — Telephone Encounter (Addendum)
Pt's daughter called she is staggering to the left side, has tingling in her fingers, studdering her words, she is sleeping alot. Patient has an appt with PCP on Monday. Daughter said the pt has not rec'd replacement medicare card yet (her wallet is lost) and because she is out of network she is not able to get PT per RN Management from [facility]. Is there any recommendation from Dr Roda Shutters regarding symptoms or should she wait and talk with PCP on Monday.

## 2017-08-01 ENCOUNTER — Emergency Department (HOSPITAL_COMMUNITY)
Admission: EM | Admit: 2017-08-01 | Discharge: 2017-08-01 | Disposition: A | Discharge status: Home / Self Care | Payer: Medicare (Managed Care) | Attending: Emergency Medicine | Admitting: Emergency Medicine

## 2017-08-01 ENCOUNTER — Emergency Department (HOSPITAL_COMMUNITY): Payer: Medicare (Managed Care)

## 2017-08-01 DIAGNOSIS — R471 Dysarthria and anarthria: Secondary | ICD-10-CM

## 2017-08-01 LAB — URINALYSIS, ROUTINE W REFLEX MICROSCOPIC
Bilirubin Urine: NEGATIVE
GLUCOSE, UA: NEGATIVE mg/dL
HGB URINE DIPSTICK: NEGATIVE
Ketones, ur: NEGATIVE mg/dL
NITRITE: NEGATIVE
PROTEIN: NEGATIVE mg/dL
SPECIFIC GRAVITY, URINE: 1.023 (ref 1.005–1.030)
pH: 5 (ref 5.0–8.0)

## 2017-08-01 LAB — MAGNESIUM: MAGNESIUM: 1.8 mg/dL (ref 1.7–2.4)

## 2017-08-01 NOTE — Discharge Instructions (Signed)
You were seen in the emergency department for slurred speech and wobbling gait. The symptoms have resolved in your neurologic exam is normal. Your workup in the emergency department has also been normal including normal labs, urine, chest x-ray, head CT. These are likely symptoms from your previous stroke. We recommend close follow-up with your neurologist. We have consulted our neurologist who recommends you continue your aspirin and Plavix and continue your medications for blood pressure, cholesterol and diabetes. He recommends follow-up with a cardiologist as an outpatient for possible loop recorder which is a monitor of your heart rhythm to make sure there is no arrhythmia. We have given you the information for the cardiologist. Please call to schedule an outpatient appointment. Please keep your appointment that is already scheduled with your neurologist.

## 2017-08-01 NOTE — ED Notes (Signed)
MD at bedside. 

## 2017-08-01 NOTE — ED Notes (Signed)
Pt

## 2017-08-01 NOTE — ED Notes (Signed)
IV attempt in R wrist unsuccessful, vein blew.

## 2017-08-01 NOTE — ED Notes (Signed)
Patient left at this time with all belongings. 

## 2017-08-01 NOTE — ED Provider Notes (Signed)
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 Symptoms have resolved. Patient reports compliance with her medications. No head injury. No current numbness, tingling or focal weakness.  She does report she has had a mild cough. No fever. No vomiting or diarrhea. No chest pain or shortness of breath. No history of atrial fibrillation.  Patient had a CTA of her head and neck during her previous admission. Carotid and vertebral arteries were patent. There is no large vessel occlusion, aneurysm or significant stenosis. 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  There were extensive areas of intracranial arthrosclerosis with multiple areas of stenosis greatest at the left paraclinoid ICA segment rare was moderate to severe.  Patient also had an echocardiogram with an EF of 55-60%. She had grade 1 diastolic dysfunction and mild mitral regurgitation.her lipid panel showed an LDL of 126, HDL 32, triglycerides 119. Her hemoglobin A1c was 8.5. She has outpatient neurology follow-up scheduled  ROS: See HPI Constitutional: no fever  Eyes: no drainage  ENT: no runny nose   Cardiovascular:  no chest pain  Resp: no SOB  GI: no vomiting GU: no dysuria Integumentary: no rash  Allergy: no hives  Musculoskeletal: no leg swelling  Neurological: no slurred speech ROS otherwise negative  PAST MEDICAL HISTORY/PAST SURGICAL HISTORY:  Past Medical History:  Diagnosis Date  . Diabetes mellitus without complication (HCC)   . High cholesterol   . Hypertension     MEDICATIONS:  Prior to Admission medications    Medication Sig Start Date End Date Taking? Authorizing Provider  aspirin 325 MG EC tablet Take 1 tablet (325 mg total) by mouth daily. 07/23/17   Lenox Ponds, MD  atorvastatin (LIPITOR) 40 MG tablet Take 1 tablet (40 mg total) by mouth daily at 6 PM. 07/22/17   Randel Pigg, Dorma Russell, MD  clopidogrel (PLAVIX) 75 MG tablet Take 1 tablet (75 mg total) by mouth daily. 07/23/17   Lenox Ponds, MD  LANTUS SOLOSTAR 100 UNIT/ML Solostar Pen Inject 55 Units into the skin daily. 07/22/17   Lenox Ponds, MD  metFORMIN (GLUCOPHAGE-XR) 500 MG 24 hr tablet Take 2 tablets (1,000 mg total) by mouth daily with breakfast. 07/22/17   Randel Pigg, Dorma Russell, MD    ALLERGIES:  No Known Allergies  SOCIAL HISTORY:  Social History  Substance Use Topics  . Smoking status: Never Smoker  . Smokeless tobacco: Never Used  . Alcohol use No    FAMILY HISTORY: History reviewed. No pertinent family history.  EXAM: BP 129/80 (BP Location: Right Arm)   Pulse 77   Temp 98.2 F (36.8 C) (Oral)   Resp 18   Ht 5\' 2"  (1.575 m)   Wt 87.1 kg (192 lb)   SpO2 98%   BMI 35.12 kg/m  CONSTITUTIONAL: Alert and oriented and responds appropriately to questions. Well-appearing; well-nourished HEAD: Normocephalic EYES: Conjunctivae clear, pupils appear equal, EOMI ENT: normal nose; moist mucous membranes NECK: Supple, no meningismus, no nuchal rigidity, no LAD  CARD: RRR; S1 and S2 appreciated; no murmurs, no clicks, no rubs, no gallops RESP: Normal chest excursion without splinting or tachypnea; breath sounds clear and equal bilaterally; no wheezes, no  rhonchi, no rales, no hypoxia or respiratory distress, speaking full sentences ABD/GI: Normal bowel sounds; non-distended; soft, non-tender, no rebound, no guarding, no peritoneal signs, no hepatosplenomegaly BACK:  The back appears normal and is non-tender to palpation, there is no CVA tenderness EXT: Normal ROM in all joints; non-tender to palpation; no edema;  normal capillary refill; no cyanosis, no calf tenderness or swelling    SKIN: Normal color for age and race; warm; no rash NEURO: Moves all extremities equally; Strength 5/5 in all four extremities.  Normal sensation diffusely.  CN 2-12 grossly intact.  No dysmetria to finger to nose testing bilaterally.  Normal speech.  Normal gait. PSYCH: The patient's mood and manner are appropriate. Grooming and personal hygiene are appropriate.  MEDICAL DECISION MAKING: Patient here with episode of slurred speech, wobbly gait the right hand tingling. Symptoms are now completely resolved. These are similar symptoms to what she had previously. She has just had a full workup including MRI of her brain, CTA of her brain and neck, echocardiogram, lipid panel, hemoglobin A 1C. Will discuss with neurology for further recommendations.  Workup obtained in triage including labs and head CT showed no acute abnormality.  ED PROGRESS:   2:25 AM  D/w Dr. Otelia Limes with neurology.  He recommends looking for any possible medical cause for return of her symptoms. Recommends checking urinalysis. Given her cough, we'll obtain chest x-ray. Recommends obtaining a magnesium level. If all of this is negative and she is still asymptomatic he feels that we have maximized her medical management and recommends close outpatient neurology follow-up as well as cardiology follow-up for a loop recorder.   4:20 AM  Pt's urine shows no obvious infection. Chest x-ray is clear. Magnesium is normal.  Patient's daughter neurologic baseline. Able to ambulate without difficulty. She reports feeling better. Her vital signs are normal. Have advised her to continue Plavix and aspirin. She is comfortable with plan for discharge home with close outpatient neurology follow-up. We did discuss at length return precautions. Patient's daughter also comfortable with this plan.   At this time, I do not feel there is any life-threatening condition present. I have  reviewed and discussed all results (EKG, imaging, lab, urine as appropriate) and exam findings with patient/family. I have reviewed nursing notes and appropriate previous records.  I feel the patient is safe to be discharged home without further emergent workup and can continue workup as an outpatient as needed. Discussed usual and customary return precautions. Patient/family verbalize understanding and are comfortable with this plan.  Outpatient follow-up has been provided if needed. All questions have been answered.    EKG Interpretation  Date/Time:  Thursday July 31 2017 17:28:46 EDT Ventricular Rate:  81 PR Interval:  156 QRS Duration: 80 QT Interval:  374 QTC Calculation: 434 R Axis:   -4 Text Interpretation:  Normal sinus rhythm with sinus arrhythmia Left ventricular hypertrophy Nonspecific T wave abnormality Abnormal ECG Confirmed by Emeri Estill, Baxter Hire [phone]) on 08/01/2017 2:03:50 AM         Deanglo Hissong, Layla Maw, DO 08/01/17 0501

## 2017-08-01 NOTE — ED Notes (Signed)
Patient ambulated to bathroom with steady gait and only stand by assist. Patient states that she normally uses a walker. Patient did not need one at this time.

## 2017-08-04 ENCOUNTER — Ambulatory Visit: Payer: Medicare (Managed Care) | Admitting: Internal Medicine

## 2017-08-12 ENCOUNTER — Ambulatory Visit: Payer: Medicare (Managed Care) | Admitting: Internal Medicine

## 2017-08-19 DIAGNOSIS — I639 Cerebral infarction, unspecified: Secondary | ICD-10-CM | POA: Diagnosis not present

## 2017-08-19 DIAGNOSIS — I10 Essential (primary) hypertension: Secondary | ICD-10-CM | POA: Diagnosis not present

## 2017-08-19 DIAGNOSIS — E785 Hyperlipidemia, unspecified: Secondary | ICD-10-CM | POA: Diagnosis not present

## 2017-08-19 DIAGNOSIS — E1165 Type 2 diabetes mellitus with hyperglycemia: Secondary | ICD-10-CM | POA: Diagnosis not present

## 2017-09-01 ENCOUNTER — Ambulatory Visit (INDEPENDENT_AMBULATORY_CARE_PROVIDER_SITE_OTHER): Payer: Medicare HMO | Admitting: Internal Medicine

## 2017-09-01 ENCOUNTER — Encounter: Payer: Self-pay | Admitting: Internal Medicine

## 2017-09-01 VITALS — BP 155/85 | HR 89 | Ht 62.0 in | Wt 186.2 lb

## 2017-09-01 DIAGNOSIS — I10 Essential (primary) hypertension: Secondary | ICD-10-CM | POA: Diagnosis not present

## 2017-09-01 DIAGNOSIS — R0681 Apnea, not elsewhere classified: Secondary | ICD-10-CM | POA: Insufficient documentation

## 2017-09-01 DIAGNOSIS — R0602 Shortness of breath: Secondary | ICD-10-CM | POA: Diagnosis not present

## 2017-09-01 DIAGNOSIS — M79606 Pain in leg, unspecified: Secondary | ICD-10-CM | POA: Diagnosis not present

## 2017-09-01 DIAGNOSIS — E785 Hyperlipidemia, unspecified: Secondary | ICD-10-CM | POA: Diagnosis not present

## 2017-09-01 DIAGNOSIS — G4719 Other hypersomnia: Secondary | ICD-10-CM | POA: Insufficient documentation

## 2017-09-01 DIAGNOSIS — R209 Unspecified disturbances of skin sensation: Secondary | ICD-10-CM | POA: Diagnosis not present

## 2017-09-01 NOTE — Patient Instructions (Addendum)
Your physician has requested that you have a lexiscan myoview. For further information please visit [email or web address] Please follow instruction sheet, as given.  Your physician has requested that you have an ankle brachial index (ABI). During this test an ultrasound and blood pressure cuff are used to evaluate the arteries that supply the arms and legs with blood. Allow thirty minutes for this exam. There are no restrictions or special instructions.  ** both tests are done in our office ([address])  Your physician has recommended that you have a sleep study @ [employer] [facility]. This test records several body functions during sleep, including: brain activity, eye movement, oxygen and carbon dioxide blood levels, heart rate and rhythm, breathing rate and rhythm, the flow of air through your mouth and nose, snoring, body muscle movements, and chest and belly movement.  Your physician recommends that you schedule a follow-up appointment with Dr. Rennis Golden after your testing.

## 2017-09-01 NOTE — Progress Notes (Signed)
OFFICE NOTE  Chief Complaint:  Recent stroke  Primary Care Physician: Jackie Plum, MD  HPI:  Carrie Warner is a 71 y.o. female with a past medial history significant for the 2 diabetes, hypertension, dyslipidemia and moderate obesity. Recently she presented to  with symptoms concerning for stroke. She was evaluated and treated for right brain infarct and has some minimal deficits. She's currently on aspirin and Plavix. She was found to have an elevated LDL 126 and started on Lipitor 40 mg daily. She also reports significant fatigue and daytime sleepiness which is worsened since her stroke. Her Epworth sleepiness scale score was 21. She was noted to have episodes of apnea while hospitalized. She denies any chest pain, but reports shortness of breath with exertion and fatigue. This all seems to be somewhat new. She recently moved back to [place] from [place] and is staying with her children. 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 There is extensive intracranial atherosclerosis. She also reports pain in her legs when walking. She feels that her feet are cold and her symptoms improved somewhat with rest.  PMHx:  Past Medical History:  Diagnosis Date  . Diabetes mellitus without complication (HCC)   . High cholesterol   . Hypertension   . Stroke [facility])     History reviewed. No pertinent surgical history.  FAMHx:  Family History  Problem Relation Age of Onset  . Alzheimer's disease Mother   . Heart disease Father   . Alcohol abuse Sister   . Stroke Brother     SOCHx:   reports that she has never smoked. She has never used smokeless tobacco. She reports that she does not drink alcohol or use drugs.  ALLERGIES:  No Known Allergies  ROS: Pertinent items noted in HPI and remainder of comprehensive ROS otherwise negative.  HOME MEDS: Current Outpatient Prescriptions on File  Prior to Visit  Medication Sig Dispense Refill  . aspirin 325 MG EC tablet Take 1 tablet (325 mg total) by mouth daily. 30 tablet 0  . atorvastatin (LIPITOR) 40 MG tablet Take 1 tablet (40 mg total) by mouth daily at 6 PM. 30 tablet 0  . clopidogrel (PLAVIX) 75 MG tablet Take 1 tablet (75 mg total) by mouth daily. 30 tablet 0  . LANTUS SOLOSTAR 100 UNIT/ML Solostar Pen Inject 55 Units into the skin daily. (Patient taking differently: Inject 55 Units into the skin 2 (two) times daily. ) 15 mL 11  . metFORMIN (GLUCOPHAGE-XR) 500 MG 24 hr tablet Take 2 tablets (1,000 mg total) by mouth daily with breakfast. 60 tablet 0   No current facility-administered medications on file prior to visit.     LABS/IMAGING: No results found for this or any previous visit (from the past 48 hour(s)). No results found.  LIPID PANEL:    Component Value Date/Time   CHOL 182 07/21/2017 0329   TRIG 119 07/21/2017 0329   HDL 32 (L) 07/21/2017 0329   CHOLHDL 5.7 07/21/2017 0329   VLDL 24 07/21/2017 0329   LDLCALC 126 (H) 07/21/2017 0329     WEIGHTS: Wt Readings from Last 3 Encounters:  09/01/17 186 lb 3.2 oz (84.5 kg)  07/31/17 192 lb (87.1 kg)  07/19/17 195 lb 5.2 oz (88.6 kg)    VITALS: BP (!) 155/85   Pulse 89   Ht  (1.575 m)   Wt 186 lb 3.2 oz (84.5 kg)   BMI  34.06 kg/m   EXAM: General appearance: alert and no distress Neck: no carotid bruit, no JVD and thyroid not enlarged, symmetric, no tenderness/mass/nodules Lungs: clear to auscultation bilaterally Heart: regular rate and rhythm Abdomen: soft, non-tender; bowel sounds normal; no masses,  no organomegaly and Mildly obese Extremities: extremities normal, atraumatic, no cyanosis or edema Pulses: Decreased pedal pulses Skin: Skin color, texture, turgor normal. No rashes or lesions Neurologic: Grossly normal Psych: Pleasant  EKG: Normal sinus rhythm 81, LVH by voltage, lateral T-wave inversions-personally reviewed) study from August  2018  ASSESSMENT: 1. Progressive fatigue and dyspnea on exertion 2. Recent stroke with extensive atherosclerosis and intracranial atherosclerosis 3. Type 2 diabetes 4. Hypertension 5. Dyslipidemia 6. Nonrestorative sleep and daytime fatigue 7. Leg pain and cool feet  PLAN: 1.   Mrs. Mikita is presented for evaluation of progressive fatigue and dyspnea and exertion. She had recent stroke and was found have extensive atherosclerosis any intercranial atherosclerosis. Her father is a history of coronary disease. There stroke in her brother. She has lost any history of hypertension, type 2 diabetes and dyslipidemia. Like for her to undergo a [employer] as she is unable to walk without a walker. In addition she has non-restorative sleep and daytime fatigue and was found to have a high EPWSS of 21 with witnessed episodes of apnea in the hospital. Will order sleep studies are may be a component of a central sleep apnea related to her recent stroke. Finally she is noted to have some leg pain and cool feet. She does have pulses although they are difficult to palpate. I like to rule out PAD and we'll start with a screening ABI.  Follow-up with me afterwards.  Chrystie Nose, MD, [facility]  [place]  [facility]  Attending Cardiologist  Direct Dial: [phone]  Fax: [phone]  Website:  [email or web address] 09/01/2017, 12:47 PM

## 2017-09-02 ENCOUNTER — Other Ambulatory Visit: Payer: Self-pay | Admitting: Internal Medicine

## 2017-09-02 DIAGNOSIS — R209 Unspecified disturbances of skin sensation: Secondary | ICD-10-CM

## 2017-09-02 DIAGNOSIS — R0602 Shortness of breath: Secondary | ICD-10-CM

## 2017-09-04 ENCOUNTER — Encounter: Payer: Medicare HMO | Attending: Internal Medicine | Admitting: Registered"

## 2017-09-04 ENCOUNTER — Telehealth (HOSPITAL_COMMUNITY): Payer: Self-pay

## 2017-09-04 ENCOUNTER — Encounter: Payer: Self-pay | Admitting: Registered"

## 2017-09-04 DIAGNOSIS — Z713 Dietary counseling and surveillance: Secondary | ICD-10-CM | POA: Diagnosis not present

## 2017-09-04 DIAGNOSIS — E1165 Type 2 diabetes mellitus with hyperglycemia: Secondary | ICD-10-CM | POA: Diagnosis not present

## 2017-09-04 DIAGNOSIS — E118 Type 2 diabetes mellitus with unspecified complications: Secondary | ICD-10-CM

## 2017-09-04 NOTE — Progress Notes (Addendum)
Diabetes Self-Management Education  Visit Type: First/Initial  Appt. Start Time: 1015 Appt. End Time: 1115  09/04/2017  [name], identified by name and date of birth, is a 71 y.o. female with a diagnosis of Diabetes: Type 2.   ASSESSMENT This patient is accompanied in the office by her daughter. Patient relies on daughter to provide much of the information. Pt states she likes to have literature to read. Patient's daughter reports that the meter they have is old and not working properly. RD provided a new meter. Pt was very sleepy during visit and daughter said they she is always very sleepy. Because patient A1c is 8.8, and her POC during visit was 397 mg/dL, RD suspects patient may be having extreme highs and lows to get the average 8.8% which would help explain frequent sleepiness.  Meter given: One Touch Verio Lot #: [id number] X Exp: 12/08/18 BG: 397  Patient's daughter states her mother recently enrolled in [employer] and they plan to start going to water aerobics at [employer].  RD only had time to touch on basic of eating balanced meals and plans to cover the Diabetes booklet food chapter in detail during next visit.     Diabetes Self-Management Education - 09/04/17 1122      Visit Information   Visit Type First/Initial     Initial Visit   Diabetes Type Type 2   Are you currently following a meal plan? No   Are you taking your medications as prescribed? Yes   Date Diagnosed 01-19-08     Health Coping   How would you rate your overall health? Fair     Psychosocial Assessment   Self-care barriers Unsteady gait/risk for falls   Other persons present Patient;Family Member   How often do you need to have someone help you when you read instructions, pamphlets, or other written materials from your doctor or pharmacy? 4 - Often   What is the last grade level you completed in school? 8 grade     Complications   Last HgB A1C per patient/outside source 8.8 %   07/28/17 per outside referral   How often do you check your blood sugar? 1-2 times/day   Fasting Blood glucose range (mg/dL) 16-109   Have you had a dilated eye exam in the past 12 months? No   Have you had a dental exam in the past 12 months? No   Are you checking your feet? No     Dietary Intake   Breakfast eggs, 1 pancake, water OR grilled cheese OR oatmeal & sasauge, coffee with cream and sugar (raw)   Snack (morning) none OR fruit snacks OR strawberry waffers   Lunch Popeye's 4/$5 OR grilled cheese OR left overs   Snack (afternoon) banana with peanut butter   Dinner meat, vegetable, starch   Snack (evening) fruit snacks OR PB crackers OR peanuts   Beverage(s) water, coffee, gingerale 1x week, sprite zero occassionally     Exercise   Exercise Type ADL's   How many days per week to you exercise? 0   How many minutes per day do you exercise? 0   Total minutes per week of exercise 0     Patient Education   Previous Diabetes Education No   Disease state  Definition of diabetes, type 1 and 2, and the diagnosis of diabetes   Monitoring Identified appropriate SMBG and/or A1C goals.   Acute complications Taught treatment of hypoglycemia - the 15 rule.  Individualized Goals (developed by patient)   Nutrition General guidelines for healthy choices and portions discussed   Monitoring  test my blood glucose as discussed     Outcomes   Expected Outcomes Demonstrated interest in learning. Expect positive outcomes   Future DMSE 2 wks   Program Status Not Completed    Individualized Plan for Diabetes Self-Management Training:   Learning Objective:  Patient will have a greater understanding of diabetes self-management. Patient education plan is to attend individual and/or group sessions per assessed needs and concerns.   Patient Instructions   Try Hazelnut sugar free creamer  Be sure to eat protein with meals and snacks  Let doctor know you need prescription for strips and  lancets for you OneTouch Verio Flex meter.  Finish setting up glucose meter with target ranges for blood sugar: Fasting is 80-130 mg/dL and post-meal (2 hrs) less than 180 mg/dL  Continue checking blood sugar in the morning before you eat  Start checking blood sugar 2 hours after a few different meals during the week  Expected Outcomes:  Demonstrated interest in learning. Expect positive outcomes  Education material provided: Living Well with Diabetes, My Plate and Snack sheet  If problems or questions, patient to contact team via:  Phone and MyChart  Future DSME appointment: 2 wks

## 2017-09-04 NOTE — Patient Instructions (Signed)
   Try Hazelnut sugar free creamer  Be sure to eat protein with meals and snacks  Let doctor know you need prescription for strips and lancets for you OneTouch Verio Flex meter.  Finish setting up glucose meter with target ranges for blood sugar: Fasting is 80-130 mg/dL and post-meal (2 hrs) less than 180 mg/dL  Continue checking blood sugar in the morning before you eat  Start checking blood sugar 2 hours after a few different meals during the week

## 2017-09-04 NOTE — Telephone Encounter (Signed)
Encounter complete. 

## 2017-09-05 ENCOUNTER — Inpatient Hospital Stay (HOSPITAL_COMMUNITY): Admission: RE | Admit: 2017-09-05 | Payer: Medicare (Managed Care) | Source: Ambulatory Visit

## 2017-09-08 DIAGNOSIS — R69 Illness, unspecified: Secondary | ICD-10-CM | POA: Diagnosis not present

## 2017-09-09 ENCOUNTER — Ambulatory Visit (HOSPITAL_COMMUNITY)
Admission: RE | Admit: 2017-09-09 | Discharge: 2017-09-09 | Disposition: A | Discharge status: Home / Self Care | Payer: Medicare HMO | Source: Ambulatory Visit | Attending: Cardiovascular Disease | Admitting: Cardiovascular Disease

## 2017-09-09 DIAGNOSIS — R0602 Shortness of breath: Secondary | ICD-10-CM

## 2017-09-09 DIAGNOSIS — I10 Essential (primary) hypertension: Secondary | ICD-10-CM

## 2017-09-09 DIAGNOSIS — E785 Hyperlipidemia, unspecified: Secondary | ICD-10-CM

## 2017-09-10 NOTE — Progress Notes (Signed)
[facility]  PATIENT: Carrie Warner DOB: 1946-11-22   REASON FOR VISIT:  Hospital follow-up for sroke HISTORY FROM:patient and daughter Carrie Warner    HISTORY OF PRESENT ILLNESS:FROM RECORDBertha Blufordis an 71 y.o.femalewith a past medical history of hypertension, hyperlipidemia and DM2, who presented to [facility] with weakness, AMS, left facial droop and drooling.  She arrived to the ED following a several hour of unresponsiveness that began at a church in [place] and ended after she received IV fluids.  The patient's children noticed that she has had progressive trouble walking over the past three months with more acute worsening over the past 3-4 days.  On arrival, Carrie Warner was hypotensive and difficult to rouse with aleft facial droop and drooling. Per report, She had positive orthostatic vital signs despite judicious fluid resuscitation. She has no recollection of the events that brought her here.  On initial exam by the neurohospitalist, she was sitting in bed, and her facial droop and drooling had resolved. She was alert and oriented, able to name objects. Denies dizziness, light headedness, headache and any pain. She was eager to eat her lunch. Her daughter noted that, since being admitted, she occasionally gets light headed and asks to lay down. She also states that her mother has been confused since getting here (mistaking events from today as those from last week).  At baseline, Carrie Warner walks independently. She looks after her own affairs, bathes, cooks and cleans for herself.  MRI of the brain on 07/19/17 showed small areas of acute infarct in the right posterior limb internal capsule and right anterior thalamus, and neurology was called consulted for additional evaluation.     Date last known well: Unable to determine Time last known well: Unable to determine Modified Rankin Score: 1 Patient was not administered IV t-PA secondary to arriving outside of the  treatment window. Interval history 10/04/2018CM Carrie Warner, 71 year old female returns for hospital follow-up for stroke. MRI small areas of acute infarct in the posterior limb Internal capsule on the right and in the right anterior thalamus. CTA head and neck extensive intracranial atherosclerosis/stenosis greatest in the left paraclinoid ICA segment where it is moderate to severe. 2-D echo EF 60-65%, no source of embolus. LDL 126. Hemoglobin A1c 8.5 she was started on DAPT aspirin and Plavix for 3 months, then Plavix alone. She has not had recurrent stroke or TIA symptoms She has minimal bruising and no bleeding.. She remains on Lipitor without myalgias. Blood sugar this morning 159. Blood pressure in the office today 140/70 Sleep study has been ordered for her daytime sleepiness and fatigue by Dr. Rennis Golden. She is currently going to a diabetic educator. She is due to have lower extremity arterial imaging in October due to the pain in her legs when walking.She returns for evaluation    REVIEW OF SYSTEMS: Full 14 system review of systems performed and notable only for those listed, all others are neg:  Constitutional: neg  Cardiovascular: neg Ear/Nose/Throat: neg  Skin: neg Eyes: neg Respiratory: shortness of breath Gastroitestinal: neg  Hematology/Lymphatic: neg  Endocrine: neg Musculoskeletal:neg Allergy/Immunology: neg Neurological: weakness Psychiatric: neg Sleep : daytime sleep, fatigue   ALLERGIES: No Known Allergies  HOME MEDICATIONS: Outpatient Medications Prior to Visit  Medication Sig Dispense Refill  . aspirin 325 MG EC tablet Take 1 tablet (325 mg total) by mouth daily. 30 tablet 0  . atorvastatin (LIPITOR) 40 MG tablet Take 1 tablet (40 mg total) by mouth daily at 6 PM. 30 tablet 0  .  Cholecalciferol (VITAMIN D PO) Take by mouth.    . clopidogrel (PLAVIX) 75 MG tablet Take 1 tablet (75 mg total) by mouth daily. 30 tablet 0  . LANTUS SOLOSTAR 100 UNIT/ML Solostar Pen  Inject 55 Units into the skin daily. (Patient taking differently: Inject 35 Units into the skin 2 (two) times daily. ) 15 mL 11  . losartan (COZAAR) 50 MG tablet Take 50 mg by mouth daily.  2  . metFORMIN (GLUCOPHAGE-XR) 500 MG 24 hr tablet Take 2 tablets (1,000 mg total) by mouth daily with breakfast. 60 tablet 0   No facility-administered medications prior to visit.     PAST MEDICAL HISTORY: Past Medical History:  Diagnosis Date  . Diabetes mellitus without complication (HCC)   . High cholesterol   . Hypertension   . Stroke [facility])     PAST SURGICAL HISTORY: History reviewed. No pertinent surgical history.  FAMILY HISTORY: Family History  Problem Relation Age of Onset  . Alzheimer's disease Mother   . Heart disease Father   . Alcohol abuse Sister   . Stroke Brother     SOCIAL HISTORY: Social History   Social History  . Marital status: Single    Spouse name: N/A  . Number of children: N/A  . Years of education: N/A   Occupational History  . Not on file.   Social History Main Topics  . Smoking status: Never Smoker  . Smokeless tobacco: Never Used  . Alcohol use No  . Drug use: No  . Sexual activity: Not Currently   Other Topics Concern  . 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  Does not work.  Has 8 children (3 of which have passed away).  Drinks 2 cups coffee/tea daily.      PHYSICAL EXAM  Vitals:   09/11/17 1017  BP: (!) 140/70  Pulse: 79  Weight: 188 lb 3.2 oz (85.4 kg)  Height: 5\' 2"  (1.575 m)   Body mass index is 34.42 kg/m.  Generalized: Well developed, obese female in no acute distress  Head: normocephalic and atraumatic,. Oropharynx benign  Neck: Supple, no carotid bruits  Cardiac: Regular rate rhythm, no murmur  Musculoskeletal: No deformity   Neurological examination   Mentation: Alert oriented to time, place, history taking. Attention span and concentration appropriate. Recent and remote  memory intact.  Follows all commands speech and language fluent.   Cranial nerve II-XII: Fundoscopic exam reveals sharp disc margins.Pupils were equal round reactive to light extraocular movements were full, visual field were full on confrontational test. Facial sensation and strength were normal. hearing was intact to finger rubbing bilaterally. Uvula tongue midline. head turning and shoulder shrug were normal and symmetric.Tongue protrusion into cheek strength was normal. Motor: normal bulk and tone, full strength in the BUE, BLE, fine finger movements normal, no pronator drift. No focal weakness Sensory: normal and symmetric to light touch, pinprick, and  Vibration upper and lower extremities, proprioception  Coordination: finger-nose-finger, heel-to-shin bilaterally, no dysmetria,no tremor Reflexes: + upper lower and symmetric,  plantar responses were flexor bilaterally. Gait and Station: Rising up from seated position without assistance, normal stance,  moderate stride, good arm swing, smooth turning, able to perform tiptoe, and heel walking without difficulty. Tandem gait is un steady  DIAGNOSTIC DATA (LABS, IMAGING, TESTING) - I reviewed patient records, labs, notes, testing and imaging myself where available.  Lab Results  Component Value Date   WBC 8.2 07/31/2017  HGB 14.3 07/31/2017   HCT 42.0 07/31/2017   MCV 91.5 07/31/2017   PLT 220 07/31/2017      Component Value Date/Time   NA 142 07/31/2017 1757   K 3.8 07/31/2017 1757   CL 102 07/31/2017 1757   CO2 29 07/31/2017 1743   GLUCOSE 126 (H) 07/31/2017 1757   BUN 11 07/31/2017 1757   CREATININE 0.80 07/31/2017 1757   CALCIUM 9.4 07/31/2017 1743   PROT 7.7 07/31/2017 1743   ALBUMIN 3.4 (L) 07/31/2017 1743   AST 21 07/31/2017 1743   ALT 15 07/31/2017 1743   ALKPHOS 92 07/31/2017 1743   BILITOT 0.4 07/31/2017 1743   GFRNONAA 60 (L) 07/31/2017 1743   GFRAA >60 07/31/2017 1743   Lab Results  Component Value Date   CHOL  182 07/21/2017   HDL 32 (L) 07/21/2017   LDLCALC 126 (H) 07/21/2017   TRIG 119 07/21/2017   CHOLHDL 5.7 07/21/2017   Lab Results  Component Value Date   HGBA1C 8.5 (H) 07/19/2017    Lab Results  Component Value Date   TSH 1.389 07/19/2017      ASSESSMENT AND PLAN  71 y.o. year old female  has a past medical history of Diabetes mellitus without complication (HCC); High cholesterol; Hypertension; and Stroke (HCC). Here for hospital follow-up for stroke  . MRI small areas of acute infarct in the posterior limb Internal capsule on the right and in the right anterior thalamus. CTA head and neck extensive intracranial atherosclerosis/stenosis greatest in the left paraclinoid ICA segment where it is moderate to severe. 2-D echo EF 60-65%, no source of embolus. LDL 126. Hemoglobin A1c 8.5 The patient is a current patient of Dr. Roda Shutters  who is out of the office today . This note is sent to the work in doctor.      PLAN Stressed the importance of management of risk factors to prevent further stroke Continue Plavix and aspirin until 10/19/2017 then Plavix alone for secondary stroke prevention and intracranial stenosis Maintain strict control of hypertension with blood pressure goal below 130/90, today's reading 140/70 continue antihypertensive medications Control of diabetes with hemoglobin A1c below 6.5 followed by primary care most recent hemoglobin A1c 8.5 continue diabetic medications,  Recommend weight loss. Continue follow-up with diabetic educator Cholesterol with LDL cholesterol less than 70, followed by primary care,  most recent 126 continue statin drug  Lipitor Will have nurse check with [facility] regarding therapy balance issues healthy diet with whole grains,  fresh fruits and vegetables F/U in 6 months Discussed risk for recurrent stroke/ TIA and answered additional questions This was a  visit requiring 30 minutes and medical decision making of high complexity with extensive review of  history, hospital chart, counseling and answering questions for patient and daughter Nilda Riggs, [facility], [facility], APRN  [facility] [address], [address] [place], [place] [address] [phone]

## 2017-09-11 ENCOUNTER — Ambulatory Visit (INDEPENDENT_AMBULATORY_CARE_PROVIDER_SITE_OTHER): Payer: Medicare HMO | Admitting: Nurse Practitioner

## 2017-09-11 ENCOUNTER — Encounter: Payer: Self-pay | Admitting: Nurse Practitioner

## 2017-09-11 ENCOUNTER — Other Ambulatory Visit: Payer: Self-pay | Admitting: *Deleted

## 2017-09-11 VITALS — BP 140/70 | HR 79 | Ht 62.0 in | Wt 188.2 lb

## 2017-09-11 DIAGNOSIS — I63311 Cerebral infarction due to thrombosis of right middle cerebral artery: Secondary | ICD-10-CM | POA: Diagnosis not present

## 2017-09-11 DIAGNOSIS — R269 Unspecified abnormalities of gait and mobility: Secondary | ICD-10-CM | POA: Diagnosis not present

## 2017-09-11 DIAGNOSIS — I10 Essential (primary) hypertension: Secondary | ICD-10-CM

## 2017-09-11 DIAGNOSIS — I639 Cerebral infarction, unspecified: Secondary | ICD-10-CM

## 2017-09-11 DIAGNOSIS — E785 Hyperlipidemia, unspecified: Secondary | ICD-10-CM | POA: Diagnosis not present

## 2017-09-11 NOTE — Progress Notes (Signed)
[employer] MCR PREMIER PLUS PPO GRP [id number] ID# [id number]  RX BIN [id number] PCN MEDDAET RX [id number]   [phone] provider line

## 2017-09-11 NOTE — Patient Instructions (Addendum)
Stressed the importance of management of risk factors to prevent further stroke Continue Plavix and aspirin until 10/19/2017 then Plavix alone for secondary stroke prevention Maintain strict control of hypertension with blood pressure goal below 130/90, today's reading 140/70 continue antihypertensive medications Control of diabetes with hemoglobin A1c below 6.5 followed by primary care most recent hemoglobin A1c 8.5 continue diabetic medications  Recommend weight loss Cholesterol with LDL cholesterol less than 70, followed by primary care,  most recent 126 continue statin drug  Lipitor Will have nurse check with [facility] regarding therapy healthy diet with whole grains,  fresh fruits and vegetables F/U in 6 months

## 2017-09-11 NOTE — Progress Notes (Signed)
I have read the note, and I agree with the clinical assessment and plan.  Carrie Warner,Carrie Warner   

## 2017-09-12 ENCOUNTER — Ambulatory Visit (HOSPITAL_COMMUNITY)
Admission: RE | Admit: 2017-09-12 | Discharge: 2017-09-12 | Disposition: A | Discharge status: Home / Self Care | Payer: Medicare HMO | Source: Ambulatory Visit | Attending: Cardiology | Admitting: Cardiology

## 2017-09-12 DIAGNOSIS — E785 Hyperlipidemia, unspecified: Secondary | ICD-10-CM | POA: Diagnosis not present

## 2017-09-12 DIAGNOSIS — I10 Essential (primary) hypertension: Secondary | ICD-10-CM | POA: Insufficient documentation

## 2017-09-12 DIAGNOSIS — R0602 Shortness of breath: Secondary | ICD-10-CM | POA: Insufficient documentation

## 2017-09-12 LAB — MYOCARDIAL PERFUSION IMAGING
CHL CUP NUCLEAR SRS: 4
CHL CUP NUCLEAR SSS: 8
CSEPPHR: 87 {beats}/min
LV dias vol: 71 mL (ref 46–106)
LV sys vol: 35 mL
Rest HR: 75 {beats}/min
SDS: 4
TID: 1.15

## 2017-09-12 MED ORDER — TECHNETIUM TC 99M TETROFOSMIN IV KIT
31.4000 | PACK | Freq: Once | INTRAVENOUS | Status: AC | PRN
  Administered 2017-09-12: 31.4 via INTRAVENOUS
  Filled 2017-09-12: qty 32

## 2017-09-12 MED ORDER — REGADENOSON 0.4 MG/5ML IV SOLN
0.4000 mg | Freq: Once | INTRAVENOUS | Status: AC
  Administered 2017-09-12: 0.4 mg via INTRAVENOUS

## 2017-09-12 MED ORDER — TECHNETIUM TC 99M TETROFOSMIN IV KIT
10.2000 | PACK | Freq: Once | INTRAVENOUS | Status: AC | PRN
  Administered 2017-09-12: 10.2 via INTRAVENOUS
  Filled 2017-09-12: qty 11

## 2017-09-17 ENCOUNTER — Encounter (HOSPITAL_COMMUNITY): Payer: Medicare (Managed Care)

## 2017-09-21 NOTE — Progress Notes (Signed)
Cardiology Office Note    Date:  09/22/2017   ID:  Carrie Warner, DOB 13-Sep-1946, MRN [id number]  PCP:  Jackie Plum, MD  Cardiologist: Dr. Rennis Golden   Chief Complaint  Patient presents with  . Follow-up    Abnormal stress test    History of Present Illness:    Carrie Warner is a 71 y.o. female with past medical history of HTN, HLD, Type 2 DM, and recent CVA (in 07/2017) who presents to the office today for review of her recent stress test results.   She was examined by Dr. Rennis Golden on 09/01/2017 as a new patient referral for evaluation of dyspnea on exertion and fatigue. Recent evaluation for her CVA had shown extensive atherosclerosis and with her multiple risk factors, a nuclear stress test was recommended for ischemic evaluation. She also reported having nonrestorative sleep and daytime fatigue, therefore a sleep study was recommended.  Her stress test showed a medium defect of mild severity present in the mid anteroseptal, apical anterior and apical septal location which was reversible and consistent with mild ischemia. With the test findings and her concerning symptoms, a follow-up visit was arranged to discuss definitive evaluation with a cardiac catheterization.  In talking with the patient today, she has continued to experience episodes of dyspnea on exertion and significant fatigue. Her daughter is concerned that she might be having chest discomfort but not telling her as she sees her mom rubbing her sternal region at times. She reports good compliance with her medication regimen and remains on ASA and Plavix following her recent CVA earlier this year. She denies any evidence of active bleeding with this.  She does not check her blood pressure regularly at home but it is well-controlled at 130/70 during today's visit.   Past Medical History:  Diagnosis Date  . Diabetes mellitus without complication (HCC)   . High cholesterol   . Hypertension   . Stroke [facility]) 07/2017     No past surgical history on file.  Current Medications: Outpatient Medications Prior to Visit  Medication Sig Dispense Refill  . aspirin 325 MG EC tablet Take 1 tablet (325 mg total) by mouth daily. 30 tablet 0  . Cholecalciferol (VITAMIN D PO) Take by mouth.    . metFORMIN (GLUCOPHAGE-XR) 500 MG 24 hr tablet Take 2 tablets (1,000 mg total) by mouth daily with breakfast. 60 tablet 0  . atorvastatin (LIPITOR) 40 MG tablet Take 1 tablet (40 mg total) by mouth daily at 6 PM. 30 tablet 0  . clopidogrel (PLAVIX) 75 MG tablet Take 1 tablet (75 mg total) by mouth daily. 30 tablet 0  . LANTUS SOLOSTAR 100 UNIT/ML Solostar Pen Inject 55 Units into the skin daily. 15 mL 11  . losartan (COZAAR) 50 MG tablet Take 50 mg by mouth daily.  2   No facility-administered medications prior to visit.      Allergies:   Patient has no known allergies.   Social History   Social History  . Marital status: Single    Spouse name: N/A  . Number of children: N/A  . Years of education: N/A   Social History Main Topics  . Smoking status: Never Smoker  . Smokeless tobacco: Never Used  . Alcohol use No  . Drug use: No  . Sexual activity: Not Currently   Other Topics Concern  . 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  Does not work.  Has 8 children (3 of  which have passed away).  Drinks 2 cups coffee/tea daily.      Family History:  The patient's family history includes Alcohol abuse in her sister; Alzheimer's disease in her mother; Heart disease in her father; Stroke in her brother.   Review of Systems:   Please see the history of present illness.     General:  No chills, fever, night sweats or weight changes. Positive for fatigue.  Cardiovascular:  No chest pain, edema, orthopnea, palpitations, paroxysmal nocturnal dyspnea. Positive for dyspnea on exertion.  Dermatological: No rash, lesions/masses Respiratory: No cough, dyspnea Urologic: No hematuria,  dysuria Abdominal:   No nausea, vomiting, diarrhea, bright red blood per rectum, melena, or hematemesis Neurologic:  No visual changes, wkns, changes in mental status. All other systems reviewed and are otherwise negative except as noted above.   Physical Exam:    VS:  BP 130/70 (BP Location: Right Arm, Patient Position: Sitting, Cuff Size: Normal)   Pulse 76   Ht  (1.575 m)   Wt 187 lb 9.6 oz (85.1 kg)   BMI 34.31 kg/m    General: Well developed, well nourished [place] female appearing in no acute distress. Head: Normocephalic, atraumatic, sclera non-icteric, no xanthomas, nares are without discharge.  Neck: No carotid bruits. JVD not elevated.  Lungs: Respirations regular and unlabored, without wheezes or rales.  Heart: Regular rate and rhythm. No S3 or S4.  No murmur, no rubs, or gallops appreciated. Abdomen: Soft, non-tender, non-distended with normoactive bowel sounds. No hepatomegaly. No rebound/guarding. No obvious abdominal masses. Msk:  Strength and tone appear normal for age. No joint deformities or effusions. Extremities: No clubbing or cyanosis. No lower extremity  edema.  Distal pedal pulses are 2+ bilaterally. Neuro: Alert and oriented X 3. Moves all extremities spontaneously. No focal deficits noted. Psych:  Responds to questions appropriately with a normal affect. Skin: No rashes or lesions noted  Wt Readings from Last 3 Encounters:  09/22/17 187 lb 9.6 oz (85.1 kg)  09/12/17 186 lb (84.4 kg)  09/11/17 188 lb 3.2 oz (85.4 kg)    Studies/Labs Reviewed:   EKG:  EKG is not ordered today. EKG from 07/31/2017 is reviewed which shows NSR, HR 81, with PAC's.   Recent Labs: 07/19/2017: TSH 1.389 07/31/2017: ALT 15; BUN 11; Creatinine, Ser 0.80; Hemoglobin 14.3; Platelets 220; Potassium 3.8; Sodium 142 08/01/2017: Magnesium 1.8   Lipid Panel    Component Value Date/Time   CHOL 182 07/21/2017 0329   TRIG 119 07/21/2017 0329   HDL 32 (L) 07/21/2017 0329    CHOLHDL 5.7 07/21/2017 0329   VLDL 24 07/21/2017 0329   LDLCALC 126 (H) 07/21/2017 0329    Additional studies/ records that were reviewed today include:   Echocardiogram: 07/2017 Study Conclusions  - Left ventricle: The cavity size was normal. Wall thickness was   normal. Systolic function was normal. The estimated ejection   fraction was in the range of 55% to 60%. Wall motion was normal;   there were no regional wall motion abnormalities. Doppler   parameters are consistent with abnormal left ventricular   relaxation (grade 1 diastolic dysfunction). - Mitral valve: Calcified annulus. There was mild regurgitation.  Impressions:  - Normal LV systolic function; mild diastolic dysfunction; mild MR.   NST: 09/12/2017  The left ventricular ejection fraction is mildly decreased (45-54%).  Nuclear stress EF: 51%.  There was no ST segment deviation noted during stress.  There is a medium defect of mild severity present in the  mid anteroseptal, apical anterior and apical septal location. The defect is reversible. This is consistent with mild ischemia.  This is a low risk study.    Assessment:    1. Abnormal nuclear cardiac imaging test   2. Dyspnea on exertion   3. Pre-procedure lab exam   4. Essential hypertension   5. Hyperlipidemia, unspecified hyperlipidemia type   6. Cerebrovascular accident (CVA) due to thrombosis of right middle cerebral artery (HCC)   7. IDDM (insulin dependent diabetes mellitus) (HCC)      Plan:   In order of problems listed above:  1. Abnormal Stress TRennis Goldent/ Dyspnea on Exertion - was recently evaluated by Dr. Hilty for worsening dyspnea on exertion and fatigue. A NST was recommended which showed a medium defect of mild severity present in the mid anteroseptal, apical anterior and apical septal location which was reversible and consistent with mild ischemia. With the test findings and her concerning symptoms, a follow-up visit was arranged to  discuss definitive evaluation with a cardiac catheterization. - she is still experiencing dyspnea on exertion and fatigue. Denies any recent chest pain.  - the results of the study along with plans for a cardiac catheterization were reviewed with the patient and her daughter and they are in agreement to proceed with a catheterization. The patient understands that risks include but are not limited to stroke (1 in 1000), death (1 in 1000), kidney failure [usually temporary] (1 in 500), bleeding (1 in 200), allergic reaction [possibly serious] (1 in 200). Will check pre-procedure labs today and plan for a cardiac catheterization later this week pending cath lab availability.   2. HTN - BP is well-controlled at 130/70 during today's visit. - Continue Losartan 50 mg daily.  3. HLD - Lipid Panel in 07/2017 showed total cholesterol of 182, HDL 32, and LDL 126. - she remains on Atorvastatin  daily.   4. CVA - occurring in 07/2017. She remains on ASA and Plavix.  - followed by Neurology.   5. IDDM - Hgb A1c elevated to 8.5 in 07/2017. - followed by PCP.    Medication Adjustments/Labs and Tests Ordered: Current medicines are reviewed at length with the patient today.  Concerns regarding medicines are outlined above.  Medication changes, Labs and Tests ordered today are listed in the Patient Instructions below. Patient Instructions    [facility] CARDIOVASCULAR DIVISION [facility] [address] [place] [address] Dept: [id number] Loc: [phone]  Carrie Warner  09/22/2017  You are scheduled for a Cardiac Catheterization on Friday, October 19 with Dr. Bryan Lemma.  1. Please arrive at the [facility] (Main Entrance A) at [facility]: [address] [place], [place] [address] at 11:30 AM (two hours before your procedure to ensure your preparation). Free valet parking service is available.   Special note: Every effort  is made to have your procedure done on time. Please understand that emergencies sometimes delay scheduled procedures.  2. Diet: Do not eat or drink anything after midnight prior to your procedure except sips of water to take medications.  3. Labs: You will need to have blood drawn TODAY.  4. Medication instructions in preparation for your procedure:  HOLD Metformin 24 hours prior to your procedure - last dose should be Thursday morning. HOLD Losartan the morning of the procedure   Take only 17 units of insulin the night before your procedure. Do not take any insulin on the day of the procedure.  On the  morning of your procedure, take your Aspirin and any morning medicines NOT listed above.  You may use sips of water.  5. Plan for one night stay--bring personal belongings.  6. Bring a current list of your medications and current insurance cards.  7. You MUST have a responsible person to drive you home.  8. Someone MUST be with you the first 24 hours after you arrive home or your discharge will be delayed.  9. Please wear clothes that are easy to get on and off and wear slip-on shoes.  Thank you for allowing [place] to care for you!   --  Invasive Cardiovascular services    Signed, Carlyon Prows  09/22/2017 2:37 PM    [facility] [address] [place], [address] [place], [place]  [address] Phone: [phone]; Fax: [phone]  [address], [address] [place], [place] [address] Phone: [phone]

## 2017-09-22 ENCOUNTER — Ambulatory Visit (INDEPENDENT_AMBULATORY_CARE_PROVIDER_SITE_OTHER): Payer: Medicare HMO | Admitting: Student

## 2017-09-22 ENCOUNTER — Encounter: Payer: Self-pay | Admitting: Student

## 2017-09-22 VITALS — BP 130/70 | HR 76 | Ht 62.0 in | Wt 187.6 lb

## 2017-09-22 DIAGNOSIS — R931 Abnormal findings on diagnostic imaging of heart and coronary circulation: Secondary | ICD-10-CM | POA: Diagnosis not present

## 2017-09-22 DIAGNOSIS — Z794 Long term (current) use of insulin: Secondary | ICD-10-CM

## 2017-09-22 DIAGNOSIS — I10 Essential (primary) hypertension: Secondary | ICD-10-CM

## 2017-09-22 DIAGNOSIS — R0609 Other forms of dyspnea: Secondary | ICD-10-CM

## 2017-09-22 DIAGNOSIS — Z01812 Encounter for preprocedural laboratory examination: Secondary | ICD-10-CM

## 2017-09-22 DIAGNOSIS — I63311 Cerebral infarction due to thrombosis of right middle cerebral artery: Secondary | ICD-10-CM | POA: Diagnosis not present

## 2017-09-22 DIAGNOSIS — R0600 Dyspnea, unspecified: Secondary | ICD-10-CM

## 2017-09-22 DIAGNOSIS — E785 Hyperlipidemia, unspecified: Secondary | ICD-10-CM

## 2017-09-22 DIAGNOSIS — E119 Type 2 diabetes mellitus without complications: Secondary | ICD-10-CM

## 2017-09-22 DIAGNOSIS — IMO0001 Reserved for inherently not codable concepts without codable children: Secondary | ICD-10-CM

## 2017-09-22 LAB — CBC
HEMATOCRIT: 40.5 % (ref 34.0–46.6)
HEMOGLOBIN: 13.3 g/dL (ref 11.1–15.9)
MCH: 30.4 pg (ref 26.6–33.0)
MCHC: 32.8 g/dL (ref 31.5–35.7)
MCV: 93 fL (ref 79–97)
Platelets: 196 10*3/uL (ref 150–379)
RBC: 4.38 x10E6/uL (ref 3.77–5.28)
RDW: 13.4 % (ref 12.3–15.4)
WBC: 5.9 10*3/uL (ref 3.4–10.8)

## 2017-09-22 LAB — BASIC METABOLIC PANEL
BUN/Creatinine Ratio: 13 (ref 12–28)
BUN: 11 mg/dL (ref 8–27)
CALCIUM: 10 mg/dL (ref 8.7–10.3)
CHLORIDE: 97 mmol/L (ref 96–106)
CO2: 26 mmol/L (ref 20–29)
Creatinine, Ser: 0.88 mg/dL (ref 0.57–1.00)
GFR calc Af Amer: 76 mL/min/{1.73_m2} (ref >59)
GFR calc non Af Amer: 66 mL/min/{1.73_m2} (ref >59)
GLUCOSE: 349 mg/dL — AB (ref 65–99)
Potassium: 4.3 mmol/L (ref 3.5–5.2)
Sodium: 139 mmol/L (ref 134–144)

## 2017-09-22 LAB — PROTIME-INR
INR: 1.1 (ref 0.8–1.2)
PROTHROMBIN TIME: 11.3 s (ref 9.1–12.0)

## 2017-09-22 MED ORDER — ATORVASTATIN CALCIUM 40 MG PO TABS: 40.0000 mg | ORAL_TABLET | Freq: Every day | ORAL | 6 refills | Status: DC

## 2017-09-22 MED ORDER — LOSARTAN POTASSIUM 50 MG PO TABS: 50.0000 mg | ORAL_TABLET | Freq: Every day | ORAL | 6 refills | Status: DC

## 2017-09-22 MED ORDER — CLOPIDOGREL BISULFATE 75 MG PO TABS: 75.0000 mg | ORAL_TABLET | Freq: Every day | ORAL | 5 refills | Status: DC

## 2017-09-22 NOTE — Patient Instructions (Addendum)
   [facility] CARDIOVASCULAR DIVISION [facility] [address] [place] [address] Dept: [phone] Loc: [phone]  Mayle Beckwith  09/22/2017  You are scheduled for a Cardiac Catheterization on Friday, October 19 with Dr. Bryan Lemma.  1. Please arrive at the [facility] (Main Entrance A) at [facility]: [address] [place], [place] [address] at 11:30 AM (two hours before your procedure to ensure your preparation). Free valet parking service is available.   Special note: Every effort is made to have your procedure done on time. Please understand that emergencies sometimes delay scheduled procedures.  2. Diet: Do not eat or drink anything after midnight prior to your procedure except sips of water to take medications.  3. Labs: You will need to have blood drawn TODAY.  4. Medication instructions in preparation for your procedure:  HOLD Metformin 24 hours prior to your procedure - last dose should be Thursday morning. HOLD Losartan the morning of the procedure   Take only 17 units of insulin the night before your procedure. Do not take any insulin on the day of the procedure.  On the morning of your procedure, take your Aspirin and any morning medicines NOT listed above.  You may use sips of water.  5. Plan for one night stay--bring personal belongings.  6. Bring a current list of your medications and current insurance cards.  7. You MUST have a responsible person to drive you home.  8. Someone MUST be with you the first 24 hours after you arrive home or your discharge will be delayed.  9. Please wear clothes that are easy to get on and off and wear slip-on shoes.  Thank you for allowing [place] to care for you!   -- [place] [facility]

## 2017-09-25 ENCOUNTER — Telehealth: Payer: Self-pay

## 2017-09-25 DIAGNOSIS — I480 Paroxysmal atrial fibrillation: Secondary | ICD-10-CM

## 2017-09-25 HISTORY — DX: Paroxysmal atrial fibrillation: I48.0

## 2017-09-25 NOTE — Telephone Encounter (Signed)
Follow Up:      Returning your call from today. 

## 2017-09-25 NOTE — Telephone Encounter (Signed)
Left detailed message per DPR.  Patient contacted pre-catheterization at [facility] scheduled for:  09/26/2017 @ 1330 Verified arrival time and place:  NT @ 1130 Confirmed AM meds to be taken pre-cath with sip of water: Take ASA/plavix Metformin-last dose this am 10/18 Lantus-half dose night before/none am of Hold losartan am of Notified must have driver CWCB/76 hour supervision Addl concerns:  Left this nurse name and # for call back if any questions

## 2017-09-25 NOTE — Telephone Encounter (Signed)
Call returned.  Spoke with Verler, Pt POA.  Notified that I had left detailed directions on VM and if any questions please call # left on VM.  Verler indicates understanding, no questions.

## 2017-09-26 ENCOUNTER — Inpatient Hospital Stay (HOSPITAL_COMMUNITY)
Admission: AD | Admit: 2017-09-26 | Discharge: 2017-09-30 | DRG: 246 | Disposition: A | Discharge status: Home / Self Care | Payer: Medicare HMO | Source: Ambulatory Visit | Attending: Interventional Cardiology | Admitting: Interventional Cardiology

## 2017-09-26 ENCOUNTER — Encounter (HOSPITAL_COMMUNITY): Payer: Self-pay

## 2017-09-26 ENCOUNTER — Inpatient Hospital Stay (HOSPITAL_COMMUNITY)
Admission: AD | Disposition: A | Discharge status: Home / Self Care | Payer: Self-pay | Source: Ambulatory Visit | Attending: Interventional Cardiology

## 2017-09-26 ENCOUNTER — Other Ambulatory Visit: Payer: Self-pay

## 2017-09-26 DIAGNOSIS — Z955 Presence of coronary angioplasty implant and graft: Secondary | ICD-10-CM | POA: Diagnosis not present

## 2017-09-26 DIAGNOSIS — Z7982 Long term (current) use of aspirin: Secondary | ICD-10-CM | POA: Diagnosis not present

## 2017-09-26 DIAGNOSIS — I480 Paroxysmal atrial fibrillation: Secondary | ICD-10-CM | POA: Diagnosis present

## 2017-09-26 DIAGNOSIS — E785 Hyperlipidemia, unspecified: Secondary | ICD-10-CM | POA: Diagnosis present

## 2017-09-26 DIAGNOSIS — I951 Orthostatic hypotension: Secondary | ICD-10-CM | POA: Diagnosis not present

## 2017-09-26 DIAGNOSIS — Z794 Long term (current) use of insulin: Secondary | ICD-10-CM | POA: Diagnosis not present

## 2017-09-26 DIAGNOSIS — Z7902 Long term (current) use of antithrombotics/antiplatelets: Secondary | ICD-10-CM

## 2017-09-26 DIAGNOSIS — Z23 Encounter for immunization: Secondary | ICD-10-CM | POA: Diagnosis not present

## 2017-09-26 DIAGNOSIS — R0602 Shortness of breath: Secondary | ICD-10-CM | POA: Diagnosis present

## 2017-09-26 DIAGNOSIS — E782 Mixed hyperlipidemia: Secondary | ICD-10-CM | POA: Diagnosis present

## 2017-09-26 DIAGNOSIS — E7800 Pure hypercholesterolemia, unspecified: Secondary | ICD-10-CM | POA: Diagnosis present

## 2017-09-26 DIAGNOSIS — I119 Hypertensive heart disease without heart failure: Secondary | ICD-10-CM | POA: Diagnosis present

## 2017-09-26 DIAGNOSIS — I2510 Atherosclerotic heart disease of native coronary artery without angina pectoris: Secondary | ICD-10-CM | POA: Diagnosis present

## 2017-09-26 DIAGNOSIS — Y832 Surgical operation with anastomosis, bypass or graft as the cause of abnormal reaction of the patient, or of later complication, without mention of misadventure at the time of the procedure: Secondary | ICD-10-CM | POA: Diagnosis not present

## 2017-09-26 DIAGNOSIS — Z811 Family history of alcohol abuse and dependence: Secondary | ICD-10-CM | POA: Diagnosis not present

## 2017-09-26 DIAGNOSIS — I2102 ST elevation (STEMI) myocardial infarction involving left anterior descending coronary artery: Secondary | ICD-10-CM

## 2017-09-26 DIAGNOSIS — R0609 Other forms of dyspnea: Secondary | ICD-10-CM | POA: Diagnosis not present

## 2017-09-26 DIAGNOSIS — I21A9 Other myocardial infarction type: Secondary | ICD-10-CM | POA: Diagnosis not present

## 2017-09-26 DIAGNOSIS — Z6834 Body mass index (BMI) 34.0-34.9, adult: Secondary | ICD-10-CM | POA: Diagnosis not present

## 2017-09-26 DIAGNOSIS — Z823 Family history of stroke: Secondary | ICD-10-CM

## 2017-09-26 DIAGNOSIS — R9439 Abnormal result of other cardiovascular function study: Secondary | ICD-10-CM | POA: Diagnosis not present

## 2017-09-26 DIAGNOSIS — R0600 Dyspnea, unspecified: Secondary | ICD-10-CM | POA: Diagnosis present

## 2017-09-26 DIAGNOSIS — Z79899 Other long term (current) drug therapy: Secondary | ICD-10-CM

## 2017-09-26 DIAGNOSIS — I10 Essential (primary) hypertension: Secondary | ICD-10-CM | POA: Diagnosis not present

## 2017-09-26 DIAGNOSIS — Z8249 Family history of ischemic heart disease and other diseases of the circulatory system: Secondary | ICD-10-CM | POA: Diagnosis not present

## 2017-09-26 DIAGNOSIS — I4891 Unspecified atrial fibrillation: Secondary | ICD-10-CM | POA: Diagnosis not present

## 2017-09-26 DIAGNOSIS — I97190 Other postprocedural cardiac functional disturbances following cardiac surgery: Secondary | ICD-10-CM | POA: Diagnosis not present

## 2017-09-26 DIAGNOSIS — E118 Type 2 diabetes mellitus with unspecified complications: Secondary | ICD-10-CM | POA: Diagnosis present

## 2017-09-26 DIAGNOSIS — R9431 Abnormal electrocardiogram [ECG] [EKG]: Secondary | ICD-10-CM | POA: Diagnosis not present

## 2017-09-26 DIAGNOSIS — Z8673 Personal history of transient ischemic attack (TIA), and cerebral infarction without residual deficits: Secondary | ICD-10-CM

## 2017-09-26 DIAGNOSIS — R931 Abnormal findings on diagnostic imaging of heart and coronary circulation: Secondary | ICD-10-CM

## 2017-09-26 DIAGNOSIS — E669 Obesity, unspecified: Secondary | ICD-10-CM | POA: Diagnosis present

## 2017-09-26 DIAGNOSIS — I208 Other forms of angina pectoris: Secondary | ICD-10-CM | POA: Diagnosis not present

## 2017-09-26 DIAGNOSIS — I25119 Atherosclerotic heart disease of native coronary artery with unspecified angina pectoris: Secondary | ICD-10-CM | POA: Diagnosis not present

## 2017-09-26 HISTORY — PX: LEFT HEART CATH AND CORONARY ANGIOGRAPHY: CATH118249

## 2017-09-26 HISTORY — DX: ST elevation (STEMI) myocardial infarction involving left anterior descending coronary artery: I21.02

## 2017-09-26 HISTORY — DX: Presence of coronary angioplasty implant and graft: Z95.5

## 2017-09-26 HISTORY — DX: Atherosclerotic heart disease of native coronary artery with unspecified angina pectoris: I25.119

## 2017-09-26 HISTORY — PX: CORONARY ATHERECTOMY: CATH118238

## 2017-09-26 HISTORY — DX: Paroxysmal atrial fibrillation: I48.0

## 2017-09-26 HISTORY — PX: CORONARY STENT INTERVENTION: CATH118234

## 2017-09-26 LAB — POCT ACTIVATED CLOTTING TIME
ACTIVATED CLOTTING TIME: 252 s
Activated Clotting Time: 263 seconds
Activated Clotting Time: 351 seconds
Activated Clotting Time: 257 seconds

## 2017-09-26 LAB — GLUCOSE, CAPILLARY
Glucose-Capillary: 247 mg/dL — ABNORMAL HIGH (ref 65–99)
Glucose-Capillary: 187 mg/dL — ABNORMAL HIGH (ref 65–99)

## 2017-09-26 LAB — TROPONIN I: TROPONIN I: 3.67 ng/mL — AB (ref <0.03)

## 2017-09-26 SURGERY — LEFT HEART CATH AND CORONARY ANGIOGRAPHY
Anesthesia: LOCAL

## 2017-09-26 MED ORDER — VERAPAMIL HCL 2.5 MG/ML IV SOLN
INTRAVENOUS | Status: DC | PRN
  Administered 2017-09-26: 10 mL via INTRA_ARTERIAL

## 2017-09-26 MED ORDER — HEPARIN SODIUM (PORCINE) 1000 UNIT/ML IJ SOLN
INTRAMUSCULAR | Status: AC
  Filled 2017-09-26: qty 1

## 2017-09-26 MED ORDER — INSULIN GLARGINE 100 UNIT/ML ~~LOC~~ SOLN
35.0000 [IU] | Freq: Two times a day (BID) | SUBCUTANEOUS | Status: DC
  Administered 2017-09-26 – 2017-09-30 (×8): 35 [IU] via SUBCUTANEOUS
  Filled 2017-09-26 (×9): qty 0.35

## 2017-09-26 MED ORDER — LIDOCAINE HCL (CARDIAC) 20 MG/ML IV SOLN
INTRAVENOUS | Status: AC
  Filled 2017-09-26: qty 5

## 2017-09-26 MED ORDER — IOPAMIDOL (ISOVUE-370) INJECTION 76%
INTRAVENOUS | Status: DC | PRN
  Administered 2017-09-26: 155 mL via INTRA_ARTERIAL

## 2017-09-26 MED ORDER — HEPARIN (PORCINE) IN NACL 2-0.9 UNIT/ML-% IJ SOLN
INTRAMUSCULAR | Status: AC
  Filled 2017-09-26: qty 500

## 2017-09-26 MED ORDER — SODIUM CHLORIDE 0.9 % IV SOLN: 250.0000 mL | INTRAVENOUS | Status: DC | PRN

## 2017-09-26 MED ORDER — SODIUM CHLORIDE 0.9 % IV SOLN
INTRAVENOUS | Status: DC
  Administered 2017-09-26: 12:00:00 via INTRAVENOUS

## 2017-09-26 MED ORDER — IOPAMIDOL (ISOVUE-370) INJECTION 76%
INTRAVENOUS | Status: AC
  Filled 2017-09-26: qty 100

## 2017-09-26 MED ORDER — MIDAZOLAM HCL 2 MG/2ML IJ SOLN
INTRAMUSCULAR | Status: DC | PRN
Start: 2017-09-26 — End: 2017-09-26
  Administered 2017-09-26 (×2): 1 mg via INTRAVENOUS

## 2017-09-26 MED ORDER — ASPIRIN 81 MG PO CHEW
81.0000 mg | CHEWABLE_TABLET | Freq: Every day | ORAL | Status: DC
  Administered 2017-09-27 – 2017-09-30 (×4): 81 mg via ORAL
  Filled 2017-09-26 (×4): qty 1

## 2017-09-26 MED ORDER — SODIUM CHLORIDE 0.9% FLUSH: 3.0000 mL | INTRAVENOUS | Status: DC | PRN

## 2017-09-26 MED ORDER — CLOPIDOGREL BISULFATE 75 MG PO TABS
75.0000 mg | ORAL_TABLET | Freq: Every day | ORAL | Status: DC
  Administered 2017-09-27 – 2017-09-30 (×4): 75 mg via ORAL
  Filled 2017-09-26 (×4): qty 1

## 2017-09-26 MED ORDER — ADENOSINE (DIAGNOSTIC) FOR INTRACORONARY USE
INTRAVENOUS | Status: DC | PRN
  Administered 2017-09-26 (×4): 60 ug via INTRACORONARY

## 2017-09-26 MED ORDER — NITROGLYCERIN 1 MG/10 ML FOR IR/CATH LAB
INTRA_ARTERIAL | Status: AC
  Filled 2017-09-26: qty 10

## 2017-09-26 MED ORDER — LIDOCAINE HCL 2 % IJ SOLN
INTRAMUSCULAR | Status: DC | PRN
  Administered 2017-09-26: 1 mL

## 2017-09-26 MED ORDER — MORPHINE SULFATE (PF) 4 MG/ML IV SOLN
2.0000 mg | Freq: Once | INTRAVENOUS | Status: AC
  Administered 2017-09-26: 2 mg via INTRAVENOUS
  Filled 2017-09-26: qty 1

## 2017-09-26 MED ORDER — PNEUMOCOCCAL VAC POLYVALENT 25 MCG/0.5ML IJ INJ: 0.5000 mL | INJECTION | INTRAMUSCULAR | Status: DC

## 2017-09-26 MED ORDER — ASPIRIN 81 MG PO CHEW: 81.0000 mg | CHEWABLE_TABLET | ORAL | Status: DC

## 2017-09-26 MED ORDER — SODIUM CHLORIDE 0.9% FLUSH
3.0000 mL | Freq: Two times a day (BID) | INTRAVENOUS | Status: DC
  Administered 2017-09-26 – 2017-09-30 (×4): 3 mL via INTRAVENOUS

## 2017-09-26 MED ORDER — NITROGLYCERIN 1 MG/10 ML FOR IR/CATH LAB
INTRA_ARTERIAL | Status: DC | PRN
  Administered 2017-09-26: 200 ug

## 2017-09-26 MED ORDER — CLOPIDOGREL BISULFATE 75 MG PO TABS: 75.0000 mg | ORAL_TABLET | Freq: Every day | ORAL | Status: DC

## 2017-09-26 MED ORDER — MIDAZOLAM HCL 2 MG/2ML IJ SOLN
INTRAMUSCULAR | Status: AC
  Filled 2017-09-26: qty 2

## 2017-09-26 MED ORDER — HEPARIN (PORCINE) IN NACL 100-0.45 UNIT/ML-% IJ SOLN
850.0000 [IU]/h | INTRAMUSCULAR | Status: AC
  Administered 2017-09-27 – 2017-09-28 (×2): 850 [IU]/h via INTRAVENOUS
  Filled 2017-09-26 (×2): qty 250

## 2017-09-26 MED ORDER — LABETALOL HCL 5 MG/ML IV SOLN: 10.0000 mg | INTRAVENOUS | Status: AC | PRN

## 2017-09-26 MED ORDER — ATORVASTATIN CALCIUM 40 MG PO TABS
40.0000 mg | ORAL_TABLET | Freq: Every day | ORAL | Status: DC
  Administered 2017-09-27 – 2017-09-29 (×3): 40 mg via ORAL
  Filled 2017-09-26 (×4): qty 1

## 2017-09-26 MED ORDER — ACETAMINOPHEN 325 MG PO TABS
650.0000 mg | ORAL_TABLET | ORAL | Status: DC | PRN
  Administered 2017-09-27 – 2017-09-29 (×2): 650 mg via ORAL
  Filled 2017-09-26 (×2): qty 2

## 2017-09-26 MED ORDER — INSULIN GLARGINE 100 UNIT/ML SOLOSTAR PEN: 35.0000 [IU] | PEN_INJECTOR | Freq: Two times a day (BID) | SUBCUTANEOUS | Status: DC

## 2017-09-26 MED ORDER — FENTANYL CITRATE (PF) 100 MCG/2ML IJ SOLN
INTRAMUSCULAR | Status: AC
Start: 2017-09-26 — End: 2017-09-26
  Filled 2017-09-26: qty 2

## 2017-09-26 MED ORDER — VIPERSLIDE LUBRICANT OPTIME
TOPICAL | Status: DC | PRN
  Administered 2017-09-26: 16:00:00 via SURGICAL_CAVITY

## 2017-09-26 MED ORDER — VERAPAMIL HCL 2.5 MG/ML IV SOLN
INTRAVENOUS | Status: AC
  Filled 2017-09-26: qty 2

## 2017-09-26 MED ORDER — HEPARIN SODIUM (PORCINE) 1000 UNIT/ML IJ SOLN
INTRAMUSCULAR | Status: DC | PRN
  Administered 2017-09-26: 3000 [IU] via INTRAVENOUS
  Administered 2017-09-26: 5000 [IU] via INTRAVENOUS
  Administered 2017-09-26 (×2): 4000 [IU] via INTRAVENOUS
  Administered 2017-09-26: 3000 [IU] via INTRAVENOUS

## 2017-09-26 MED ORDER — METOPROLOL TARTRATE 5 MG/5ML IV SOLN
5.0000 mg | Freq: Once | INTRAVENOUS | Status: AC
  Administered 2017-09-26: 5 mg via INTRAVENOUS
  Filled 2017-09-26: qty 5

## 2017-09-26 MED ORDER — LOSARTAN POTASSIUM 50 MG PO TABS
50.0000 mg | ORAL_TABLET | Freq: Every day | ORAL | Status: DC
Start: 2017-09-27 — End: 2017-09-30
  Administered 2017-09-27 – 2017-09-30 (×4): 50 mg via ORAL
  Filled 2017-09-26 (×4): qty 1

## 2017-09-26 MED ORDER — INFLUENZA VAC SPLIT HIGH-DOSE 0.5 ML IM SUSY
0.5000 mL | PREFILLED_SYRINGE | INTRAMUSCULAR | Status: DC
  Filled 2017-09-26: qty 0.5

## 2017-09-26 MED ORDER — SODIUM CHLORIDE 0.9 % IV SOLN
INTRAVENOUS | Status: AC
  Administered 2017-09-26: 18:00:00 via INTRAVENOUS

## 2017-09-26 MED ORDER — HEPARIN (PORCINE) IN NACL 2-0.9 UNIT/ML-% IJ SOLN
INTRAMUSCULAR | Status: AC | PRN
  Administered 2017-09-26: 1000 mL

## 2017-09-26 MED ORDER — ADENOSINE 12 MG/4ML IV SOLN
INTRAVENOUS | Status: AC
  Filled 2017-09-26: qty 4

## 2017-09-26 MED ORDER — HYDRALAZINE HCL 20 MG/ML IJ SOLN: 5.0000 mg | INTRAMUSCULAR | Status: AC | PRN

## 2017-09-26 MED ORDER — ONDANSETRON HCL 4 MG/2ML IJ SOLN
4.0000 mg | Freq: Four times a day (QID) | INTRAMUSCULAR | Status: DC | PRN
  Administered 2017-09-26: 4 mg via INTRAVENOUS
  Filled 2017-09-26: qty 2

## 2017-09-26 MED ORDER — FENTANYL CITRATE (PF) 100 MCG/2ML IJ SOLN
INTRAMUSCULAR | Status: DC | PRN
  Administered 2017-09-26 (×2): 25 ug via INTRAVENOUS

## 2017-09-26 SURGICAL SUPPLY — 26 items
BALLN EMERGE MR 2.5X20 (BALLOONS) ×2
BALLN MAVERICK OTW 2.0X15 (BALLOONS) ×2
BALLOON EMERGE MR 2.5X20 (BALLOONS) ×1 IMPLANT
BALLOON MAVERICK OTW 2.0X15 (BALLOONS) ×1 IMPLANT
CATH INFINITI 5 FR JL3.5 (CATHETERS) ×2 IMPLANT
CATH INFINITI JR4 5F (CATHETERS) ×2 IMPLANT
CATH LAUNCHER 6FR EBU 3 (CATHETERS) ×2 IMPLANT
CATH LAUNCHER 6FR EBU3.5 (CATHETERS) ×2 IMPLANT
CROWN DIAMONDBACK CLASSIC 1.25 (BURR) ×2 IMPLANT
DEVICE RAD COMP TR BAND LRG (VASCULAR PRODUCTS) ×2 IMPLANT
ELECT DEFIB PAD ADLT CADENCE (PAD) ×2 IMPLANT
GLIDESHEATH SLEND SS 6F .021 (SHEATH) ×2 IMPLANT
GUIDEWIRE INQWIRE 1.5J.035X260 (WIRE) ×1 IMPLANT
INQWIRE 1.5J .035X260CM (WIRE) ×2
KIT ENCORE 26 ADVANTAGE (KITS) ×2 IMPLANT
KIT HEART LEFT (KITS) ×2 IMPLANT
LUBRICANT VIPERSLIDE CORONARY (MISCELLANEOUS) ×2 IMPLANT
PACK CARDIAC CATHETERIZATION (CUSTOM PROCEDURE TRAY) ×2 IMPLANT
STENT SIERRA 3.00 X 18 MM (Permanent Stent) ×2 IMPLANT
TRANSDUCER W/STOPCOCK (MISCELLANEOUS) ×2 IMPLANT
TUBING CIL FLEX 10 FLL-RA (TUBING) ×2 IMPLANT
VALVE GUARDIAN II ~~LOC~~ HEMO (MISCELLANEOUS) ×2 IMPLANT
WIRE ASAHI PROWATER 180CM (WIRE) ×2 IMPLANT
WIRE ASAHI PROWATER 300CM (WIRE) ×2 IMPLANT
WIRE HI TORQ BMW 190CM (WIRE) ×2 IMPLANT
WIRE VIPER ADVANCE COR .012TIP (WIRE) ×2 IMPLANT

## 2017-09-26 NOTE — Progress Notes (Signed)
   Going to give 5mg  IV metoprolol. AFIB. BP 130's Donato Schultz, MD

## 2017-09-26 NOTE — H&P (View-Only) (Signed)
 Cardiology Office Note    Date:  09/22/2017   ID:  Carrie Warner, DOB 01/07/1946, MRN [id number]  PCP:  Osei-Bonsu, George, MD  Cardiologist: Dr. Hilty   Chief Complaint  Patient presents with  . Follow-up    Abnormal stress test    History of Present Illness:    Carrie Warner is a 70 y.o. female with past medical history of HTN, HLD, Type 2 DM, and recent CVA (in 07/2017) who presents to the office today for review of her recent stress test results.   She was examined by Dr. Hilty on 09/01/2017 as a new patient referral for evaluation of dyspnea on exertion and fatigue. Recent evaluation for her CVA had shown extensive atherosclerosis and with her multiple risk factors, a nuclear stress test was recommended for ischemic evaluation. She also reported having nonrestorative sleep and daytime fatigue, therefore a sleep study was recommended.  Her stress test showed a medium defect of mild severity present in the mid anteroseptal, apical anterior and apical septal location which was reversible and consistent with mild ischemia. With the test findings and her concerning symptoms, a follow-up visit was arranged to discuss definitive evaluation with a cardiac catheterization.  In talking with the patient today, she has continued to experience episodes of dyspnea on exertion and significant fatigue. Her daughter is concerned that she might be having chest discomfort but not telling her as she sees her mom rubbing her sternal region at times. She reports good compliance with her medication regimen and remains on ASA and Plavix following her recent CVA earlier this year. She denies any evidence of active bleeding with this.  She does not check her blood pressure regularly at home but it is well-controlled at 130/70 during today's visit.   Past Medical History:  Diagnosis Date  . Diabetes mellitus without complication (HCC)   . High cholesterol   . Hypertension   . Stroke (HCC) 07/2017     No past surgical history on file.  Current Medications: Outpatient Medications Prior to Visit  Medication Sig Dispense Refill  . aspirin 325 MG EC tablet Take 1 tablet (325 mg total) by mouth daily. 30 tablet 0  . Cholecalciferol (VITAMIN D PO) Take by mouth.    . metFORMIN (GLUCOPHAGE-XR) 500 MG 24 hr tablet Take 2 tablets (1,000 mg total) by mouth daily with breakfast. 60 tablet 0  . atorvastatin (LIPITOR) 40 MG tablet Take 1 tablet (40 mg total) by mouth daily at 6 PM. 30 tablet 0  . clopidogrel (PLAVIX) 75 MG tablet Take 1 tablet (75 mg total) by mouth daily. 30 tablet 0  . LANTUS SOLOSTAR 100 UNIT/ML Solostar Pen Inject 55 Units into the skin daily. 15 mL 11  . losartan (COZAAR) 50 MG tablet Take 50 mg by mouth daily.  2   No facility-administered medications prior to visit.      Allergies:   Patient has no known allergies.   Social History   Social History  . Marital status: Single    Spouse name: N/A  . Number of children: N/A  . Years of education: N/A   Social History Main Topics  . Smoking status: Never Smoker  . Smokeless tobacco: Never Used  . Alcohol use No  . Drug use: No  . Sexual activity: Not Currently   Other Topics Concern  . 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  Does not work.  Has 8 children (3 of   which have passed away).  Drinks 2 cups coffee/tea daily.      Family History:  The patient's family history includes Alcohol abuse in her sister; Alzheimer's disease in her mother; Heart disease in her father; Stroke in her brother.   Review of Systems:   Please see the history of present illness.     General:  No chills, fever, night sweats or weight changes. Positive for fatigue.  Cardiovascular:  No chest pain, edema, orthopnea, palpitations, paroxysmal nocturnal dyspnea. Positive for dyspnea on exertion.  Dermatological: No rash, lesions/masses Respiratory: No cough, dyspnea Urologic: No hematuria,  dysuria Abdominal:   No nausea, vomiting, diarrhea, bright red blood per rectum, melena, or hematemesis Neurologic:  No visual changes, wkns, changes in mental status. All other systems reviewed and are otherwise negative except as noted above.   Physical Exam:    VS:  BP 130/70 (BP Location: Right Arm, Patient Position: Sitting, Cuff Size: Normal)   Pulse 76   Ht 5' 2" (1.575 m)   Wt 187 lb 9.6 oz (85.1 kg)   BMI 34.31 kg/m    General: Well developed, well nourished African American female appearing in no acute distress. Head: Normocephalic, atraumatic, sclera non-icteric, no xanthomas, nares are without discharge.  Neck: No carotid bruits. JVD not elevated.  Lungs: Respirations regular and unlabored, without wheezes or rales.  Heart: Regular rate and rhythm. No S3 or S4.  No murmur, no rubs, or gallops appreciated. Abdomen: Soft, non-tender, non-distended with normoactive bowel sounds. No hepatomegaly. No rebound/guarding. No obvious abdominal masses. Msk:  Strength and tone appear normal for age. No joint deformities or effusions. Extremities: No clubbing or cyanosis. No lower extremity  edema.  Distal pedal pulses are 2+ bilaterally. Neuro: Alert and oriented X 3. Moves all extremities spontaneously. No focal deficits noted. Psych:  Responds to questions appropriately with a normal affect. Skin: No rashes or lesions noted  Wt Readings from Last 3 Encounters:  09/22/17 187 lb 9.6 oz (85.1 kg)  09/12/17 186 lb (84.4 kg)  09/11/17 188 lb 3.2 oz (85.4 kg)    Studies/Labs Reviewed:   EKG:  EKG is not ordered today. EKG from 07/31/2017 is reviewed which shows NSR, HR 81, with PAC's.   Recent Labs: 07/19/2017: TSH 1.389 07/31/2017: ALT 15; BUN 11; Creatinine, Ser 0.80; Hemoglobin 14.3; Platelets 220; Potassium 3.8; Sodium 142 08/01/2017: Magnesium 1.8   Lipid Panel    Component Value Date/Time   CHOL 182 07/21/2017 0329   TRIG 119 07/21/2017 0329   HDL 32 (L) 07/21/2017 0329    CHOLHDL 5.7 07/21/2017 0329   VLDL 24 07/21/2017 0329   LDLCALC 126 (H) 07/21/2017 0329    Additional studies/ records that were reviewed today include:   Echocardiogram: 07/2017 Study Conclusions  - Left ventricle: The cavity size was normal. Wall thickness was   normal. Systolic function was normal. The estimated ejection   fraction was in the range of 55% to 60%. Wall motion was normal;   there were no regional wall motion abnormalities. Doppler   parameters are consistent with abnormal left ventricular   relaxation (grade 1 diastolic dysfunction). - Mitral valve: Calcified annulus. There was mild regurgitation.  Impressions:  - Normal LV systolic function; mild diastolic dysfunction; mild MR.   NST: 09/12/2017  The left ventricular ejection fraction is mildly decreased (45-54%).  Nuclear stress EF: 51%.  There was no ST segment deviation noted during stress.  There is a medium defect of mild severity present in the   mid anteroseptal, apical anterior and apical septal location. The defect is reversible. This is consistent with mild ischemia.  This is a low risk study.    Assessment:    1. Abnormal nuclear cardiac imaging test   2. Dyspnea on exertion   3. Pre-procedure lab exam   4. Essential hypertension   5. Hyperlipidemia, unspecified hyperlipidemia type   6. Cerebrovascular accident (CVA) due to thrombosis of right middle cerebral artery (HCC)   7. IDDM (insulin dependent diabetes mellitus) (HCC)      Plan:   In order of problems listed above:  1. Abnormal Stress Test/ Dyspnea on Exertion - was recently evaluated by Dr. Hilty for worsening dyspnea on exertion and fatigue. A NST was recommended which showed a medium defect of mild severity present in the mid anteroseptal, apical anterior and apical septal location which was reversible and consistent with mild ischemia. With the test findings and her concerning symptoms, a follow-up visit was arranged to  discuss definitive evaluation with a cardiac catheterization. - she is still experiencing dyspnea on exertion and fatigue. Denies any recent chest pain.  - the results of the study along with plans for a cardiac catheterization were reviewed with the patient and her daughter and they are in agreement to proceed with a catheterization. The patient understands that risks include but are not limited to stroke (1 in 1000), death (1 in 1000), kidney failure [usually temporary] (1 in 500), bleeding (1 in 200), allergic reaction [possibly serious] (1 in 200). Will check pre-procedure labs today and plan for a cardiac catheterization later this week pending cath lab availability.   2. HTN - BP is well-controlled at 130/70 during today's visit. - Continue Losartan 50 mg daily.  3. HLD - Lipid Panel in 07/2017 showed total cholesterol of 182, HDL 32, and LDL 126. - she remains on Atorvastatin 40mg daily.   4. CVA - occurring in 07/2017. She remains on ASA and Plavix.  - followed by Neurology.   5. IDDM - Hgb A1c elevated to 8.5 in 07/2017. - followed by PCP.    Medication Adjustments/Labs and Tests Ordered: Current medicines are reviewed at length with the patient today.  Concerns regarding medicines are outlined above.  Medication changes, Labs and Tests ordered today are listed in the Patient Instructions below. Patient Instructions    [facility] CARDIOVASCULAR DIVISION [facility] [address] [place] [address] Dept: [phone] Loc: [phone]  Carrie Warner  09/22/2017  You are scheduled for a Cardiac Catheterization on Friday, October 19 with Dr. David Harding.  1. Please arrive at the [place] [facility] (Main Entrance A) at [facility]: [address] [place], [place] [address] at 11:30 AM (two hours before your procedure to ensure your preparation). Free valet parking service is available.   Special note: Every effort  is made to have your procedure done on time. Please understand that emergencies sometimes delay scheduled procedures.  2. Diet: Do not eat or drink anything after midnight prior to your procedure except sips of water to take medications.  3. Labs: You will need to have blood drawn TODAY.  4. Medication instructions in preparation for your procedure:  HOLD Metformin 24 hours prior to your procedure - last dose should be Thursday morning. HOLD Losartan the morning of the procedure   Take only 17 units of insulin the night before your procedure. Do not take any insulin on the day of the procedure.  On the   morning of your procedure, take your Aspirin and any morning medicines NOT listed above.  You may use sips of water.  5. Plan for one night stay--bring personal belongings.  6. Bring a current list of your medications and current insurance cards.  7. You MUST have a responsible person to drive you home.  8. Someone MUST be with you the first 24 hours after you arrive home or your discharge will be delayed.  9. Please wear clothes that are easy to get on and off and wear slip-on shoes.  Thank you for allowing us to care for you!   -- [place] [facility]    Signed, Carrie Brazee M Sophronia Varney, PA-C  09/22/2017 2:37 PM     [facility] [address] [place], [place]  [address] Phone: [phone]; Fax: [phone]  [address], [address] ,  [address] Phone: [phone]  

## 2017-09-26 NOTE — Interval H&P Note (Signed)
Cath Lab Visit (complete for each Cath Lab visit)  Clinical Evaluation Leading to the Procedure:   ACS: No.  Non-ACS:    Anginal Classification: CCS III  Anti-ischemic medical therapy: Minimal Therapy (1 class of medications)  Non-Invasive Test Results: Intermediate-risk stress test findings: cardiac mortality 1-3%/year  Prior CABG: No previous CABG      History and Physical Interval Note:  09/26/2017 2:39 PM  Carrie Warner  has presented today for surgery, with the diagnosis of abnormal nuc  The various methods of treatment have been discussed with the patient and family. After consideration of risks, benefits and other options for treatment, the patient has consented to  Procedure(s): LEFT HEART CATH AND CORONARY ANGIOGRAPHY (N/A) as a surgical intervention .  The patient's history has been reviewed, patient examined, no change in status, stable for surgery.  I have reviewed the patient's chart and labs.  Questions were answered to the patient's satisfaction.     Lance Muss

## 2017-09-26 NOTE — Progress Notes (Signed)
ANTICOAGULATION CONSULT NOTE - Initial Consult  Pharmacy Consult for heparin Indication: atrial fibrillation  No Known Allergies  Patient Measurements: Height: 5\' 2"  (157.5 cm) Weight: 187 lb (84.8 kg) IBW/kg (Calculated) : 50.1 Heparin Dosing Weight: 70 kg   Vital Signs: Temp: 98.2 F (36.8 C) (10/19 1800) Temp Source: Oral (10/19 1800) BP: 93/70 (10/19 1800) Pulse Rate: 107 (10/19 1800)  Labs: No results for input(s): HGB, HCT, PLT, APTT, LABPROT, INR, HEPARINUNFRC, HEPRLOWMOCWT, CREATININE, CKTOTAL, CKMB, TROPONINI in the last 72 hours.  Estimated Creatinine Clearance: 58.4 mL/min (by C-G formula based on SCr of 0.88 mg/dL).   Medical History: Past Medical History:  Diagnosis Date  . Diabetes mellitus without complication (HCC)   . High cholesterol   . Hypertension   . 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 Stroke [facility]) 07/2017    Medications:  Prescriptions Prior to Admission  Medication Sig Dispense Refill Last Dose  . aspirin 325 MG EC tablet Take 1 tablet (325 mg total) by mouth daily. 30 tablet 0 09/26/2017 at 0930  . atorvastatin (LIPITOR) 40 MG tablet Take 1 tablet (40 mg total) by mouth daily at 6 PM. 30 tablet 6 09/25/2017  . Biotin 1000 MCG tablet Take 1,000 mcg by mouth daily.   09/25/2017  . cholecalciferol (VITAMIN D) 1000 units tablet Take 1,000 Units by mouth daily.   09/25/2017  . clopidogrel (PLAVIX) 75 MG tablet Take 1 tablet (75 mg total) by mouth daily. 30 tablet 5 09/26/2017 at 0930  . ibuprofen (ADVIL,MOTRIN) 200 MG tablet Take 400 mg by mouth every 6 (six) hours as needed for headache or moderate pain.   prn  . LANTUS SOLOSTAR 100 UNIT/ML Solostar Pen Inject 35 Units into the skin 2 (two) times daily.   09/25/2017  . losartan (COZAAR) 50 MG tablet Take 1 tablet (50 mg total) by mouth daily. 60 tablet 6 09/25/2017  . metFORMIN (GLUCOPHAGE-XR) 500 MG 24 hr tablet Take 2 tablets (1,000 mg  total) by mouth daily with breakfast. 60 tablet 0 09/24/2017    Assessment: 18 YOF with h/o Afib s/p LHC with DES placement. Sheath removed at 1655. Pharmacy consulted to start IV heparin 6 hours post sheath removal.   Goal of Therapy:  Heparin level 0.3-0.7 units/ml Monitor platelets by anticoagulation protocol: Yes   Plan:  -Start IV heparin at 850 units/hr at 2300 tonight. No bolus  -F/u 8 hour HL -Monitor daily HL, CBC and s/s of bleeding  -F/u transition to oral anticoag  Vinnie Level, PharmD., BCPS Clinical Pharmacist Pager [phone]

## 2017-09-27 ENCOUNTER — Encounter (HOSPITAL_COMMUNITY): Payer: Self-pay

## 2017-09-27 DIAGNOSIS — R931 Abnormal findings on diagnostic imaging of heart and coronary circulation: Secondary | ICD-10-CM

## 2017-09-27 DIAGNOSIS — R9431 Abnormal electrocardiogram [ECG] [EKG]: Secondary | ICD-10-CM

## 2017-09-27 DIAGNOSIS — I208 Other forms of angina pectoris: Secondary | ICD-10-CM

## 2017-09-27 LAB — GLUCOSE, CAPILLARY
GLUCOSE-CAPILLARY: 260 mg/dL — AB (ref 65–99)
GLUCOSE-CAPILLARY: 220 mg/dL — AB (ref 65–99)
Glucose-Capillary: 261 mg/dL — ABNORMAL HIGH (ref 65–99)

## 2017-09-27 LAB — BASIC METABOLIC PANEL
ANION GAP: 12 (ref 5–15)
BUN: 11 mg/dL (ref 6–20)
CALCIUM: 8.8 mg/dL — AB (ref 8.9–10.3)
CO2: 23 mmol/L (ref 22–32)
Chloride: 102 mmol/L (ref 101–111)
Creatinine, Ser: 0.92 mg/dL (ref 0.44–1.00)
GFR calc Af Amer: >60 mL/min (ref >60)
Glucose, Bld: 301 mg/dL — ABNORMAL HIGH (ref 65–99)
Potassium: 4.4 mmol/L (ref 3.5–5.1)
Sodium: 137 mmol/L (ref 135–145)

## 2017-09-27 LAB — CBC
HCT: 37.3 % (ref 36.0–46.0)
HEMOGLOBIN: 11.8 g/dL — AB (ref 12.0–15.0)
MCH: 29.4 pg (ref 26.0–34.0)
MCHC: 31.6 g/dL (ref 30.0–36.0)
MCV: 93 fL (ref 78.0–100.0)
Platelets: 159 10*3/uL (ref 150–400)
RBC: 4.01 MIL/uL (ref 3.87–5.11)
RDW: 13.4 % (ref 11.5–15.5)
WBC: 10 10*3/uL (ref 4.0–10.5)

## 2017-09-27 LAB — TROPONIN I: Troponin I: 12.9 ng/mL (ref <0.03)

## 2017-09-27 LAB — HEPARIN LEVEL (UNFRACTIONATED)
HEPARIN UNFRACTIONATED: 0.49 [IU]/mL (ref 0.30–0.70)
Heparin Unfractionated: 0.4 IU/mL (ref 0.30–0.70)

## 2017-09-27 MED ORDER — INSULIN ASPART 100 UNIT/ML ~~LOC~~ SOLN
0.0000 [IU] | Freq: Three times a day (TID) | SUBCUTANEOUS | Status: DC
  Administered 2017-09-27 (×2): 11 [IU] via SUBCUTANEOUS
  Administered 2017-09-28: 7 [IU] via SUBCUTANEOUS
  Administered 2017-09-28: 4 [IU] via SUBCUTANEOUS
  Administered 2017-09-29: 7 [IU] via SUBCUTANEOUS
  Administered 2017-09-29 – 2017-09-30 (×2): 4 [IU] via SUBCUTANEOUS

## 2017-09-27 MED ORDER — INSULIN ASPART 100 UNIT/ML ~~LOC~~ SOLN
0.0000 [IU] | Freq: Every day | SUBCUTANEOUS | Status: DC
  Administered 2017-09-27 – 2017-09-28 (×2): 2 [IU] via SUBCUTANEOUS

## 2017-09-27 NOTE — Progress Notes (Signed)
Spoke with Dr Garth Bigness regarding ECG changes, troponin elevation He will review films and get back with me

## 2017-09-27 NOTE — Progress Notes (Signed)
Films were reviewed by Dr. Garth Bigness.  There was significant reflow at the time of the procedure. He suspects that current ECG findings and troponin elevation are consistent with evolutionary changes related to the procedure. We will continue to observe

## 2017-09-27 NOTE — Progress Notes (Signed)
ANTICOAGULATION CONSULT NOTE - Follow-up Consult  Pharmacy Consult for heparin Indication: atrial fibrillation  No Known Allergies  Patient Measurements: Height: 5\' 2"  (157.5 cm) Weight: 187 lb (84.8 kg) IBW/kg (Calculated) : 50.1 Heparin Dosing Weight: 70 kg   Vital Signs: Temp: 98.2 F (36.8 C) (10/20 1600) Temp Source: Oral (10/20 1600) BP: 132/91 (10/20 1900) Pulse Rate: 148 (10/20 1900)  Labs:  Recent Labs  09/26/17 2302 09/27/17 0336 09/27/17 1022 09/27/17 1810  HGB  --  11.8*  --   --   HCT  --  37.3  --   --   PLT  --  159  --   --   HEPARINUNFRC  --   --  0.40 0.49  CREATININE  --  0.92  --   --   TROPONINI 3.67* 12.90*  --   --     Estimated Creatinine Clearance: 55.8 mL/min (by C-G formula based on SCr of 0.92 mg/dL).     Assessment: 50 YOF with h/o Afib s/p LHC with DES placement. Patient developed atrial fibrillation during procedure. Troponin continues to increase (3.67>12.9).   Sheath removed at 1655 and heparin was restarted this morning at 0200. Heparin level 8 hours after start came back within goal range at 0.4 on a rate of 850 units/hr. CBC is within normal limits. No signs/symptoms of bleeding. No infusion issues noted by nursing.   PM heparin level therapeutic  Goal of Therapy:  Heparin level 0.3-0.7 units/ml Monitor platelets by anticoagulation protocol: Yes   Plan:  -Continue IV heparin at 850 units/hr.   -Monitor daily HL, CBC and s/s of bleeding  -F/u transition to oral anticoag  Thank you Okey Regal, PharmD [phone]

## 2017-09-27 NOTE — Progress Notes (Signed)
CARDIAC REHAB PHASE I   Pt c/o chest discomfort 8/10 at rest, will hold ambulation at this time, RN aware. Completed PCI/stent education with pt and daughter at bedside. Reviewed risk factors, anti-platelet therapy, stent card, activity restrictions, ntg, heart healthy and diabetes diet handouts and phase 2 cardiac rehab. Pt and daughter verbalized understanding. Pt agrees to phase 2 cardiac rehab referral, will send to [facility] per pt request. Pt in bed, call bell within reach. Will plan to follow-up Monday for ambulation if pt does not discharge tomorrow.   [phone] Joylene Grapes, RN, BSN 09/27/2017 12:37 PM

## 2017-09-27 NOTE — Progress Notes (Signed)
Progress Note  Patient Name: Carrie Warner Date of Encounter: 09/27/2017  Primary Cardiologist: MS     Patient Profile     71 y.o. female  admitted for catheterization following abnormal stress test associated with dyspnea. She has hypertension and hypertensive heart disease; she has diabetes.. She has a history of a stroke thought cryptogenic but now with atrial fibrillation so (19 Catheterization tandem LAD lesions treated with atherectomy/stent; atherectomy/POBA  Subjective   C/o chest pain, aggravated by breathing and chest wall manipulation Began after procedure  Inpatient Medications    Scheduled Meds: . aspirin  81 mg Oral Daily  . atorvastatin  40 mg Oral q1800  . clopidogrel  75 mg Oral Daily  . Influenza vac split quadrivalent PF  0.5 mL Intramuscular Tomorrow-1000  . insulin glargine  35 Units Subcutaneous BID  . losartan  50 mg Oral Daily  . pneumococcal 23 valent vaccine  0.5 mL Intramuscular Tomorrow-1000  . sodium chloride flush  3 mL Intravenous Q12H   Continuous Infusions: . sodium chloride    . heparin 850 Units/hr (09/27/17 0200)   PRN Meds: sodium chloride, acetaminophen, ondansetron (ZOFRAN) IV, sodium chloride flush   Vital Signs    Vitals:   09/27/17 0600 09/27/17 0700 09/27/17 0741 09/27/17 0800  BP: 110/71 120/69  117/71  Pulse: 68 66  65  Resp: 20 12  18   Temp:   98.2 F (36.8 C)   TempSrc:   Oral   SpO2: 98% 100%  100%  Weight:      Height:        Intake/Output Summary (Last 24 hours) at 09/27/17 0944 Last data filed at 09/27/17 0800  Gross per 24 hour  Intake              451 ml  Output              600 ml  Net             -149 ml   Filed Weights   09/26/17 1126  Weight: 187 lb (84.8 kg)    Telemetry    nsr with occ PVC - Personally Reviewed  ECG    10/19 2153  Sinus and normal  10/20 0745 Sinus with new STE 72mmV2-5 with t wave inversion  - Personally Reviewed  Physical Exam   GEN: uncomfortable  Neck: JVD   cm Cardiac: RRR, no  murmurs, rubs, or gallops.  Tenderness at the costochondral junction  Respiratory: Clear to auscultation bilaterally. GI: Soft, nontender, non-distended  MS: tr edema; No deformity. Neuro:  Nonfocal  Psych: Normal affect  Skin Warm and dry   Labs    Chemistry Recent Labs Lab 09/22/17 1057 09/27/17 0336  NA 139 137  K 4.3 4.4  CL 97 102  CO2 26 23  GLUCOSE 349* 301*  BUN 11 11  CREATININE 0.88 0.92  CALCIUM 10.0 8.8*  GFRNONAA 66 >60  GFRAA 76 >60  ANIONGAP  --  12     Hematology Recent Labs Lab 09/22/17 1057 09/27/17 0336  WBC 5.9 10.0  RBC 4.38 4.01  HGB 13.3 11.8*  HCT 40.5 37.3  MCV 93 93.0  MCH 30.4 29.4  MCHC 32.8 31.6  RDW 13.4 13.4  PLT 196 159    Cardiac Enzymes Recent Labs Lab 09/26/17 2302 09/27/17 0336  TROPONINI 3.67* 12.90*   No results for input(s): TROPIPOC in the last 168 hours.   BNPNo results for input(s): BNP, PROBNP in the last 168  hours.   DDimer No results for input(s): DDIMER in the last 168 hours.   Radiology    No results found.  Cardiac Studies      Assessment & Plan    CAD s/p revascularizaiton now with Chhest pain atypical/+ Tn  Abnormal ECG-new HTN DM Stroke-8/16 Afib paroxysmal   Her ECG is quite concerning even though her clinical exam suggested costochondral discomfort Will repeat ECG and discuss with Dr PJ--I am surprised by the level of troponin, although not sure what atherectomy would do   Begin sliding scale for DM Currently on heparing with intervention and for afib,  Will prob need triple therapy for one month and then ongoing anticoagulation and anti platlet therapy     Signed, Sherryl MangesSteven Klein, MD  09/27/2017, 9:44 AM

## 2017-09-27 NOTE — Progress Notes (Signed)
ANTICOAGULATION CONSULT NOTE - Follow-up Consult  Pharmacy Consult for heparin Indication: atrial fibrillation  No Known Allergies  Patient Measurements: Height: 5\' 2"  (157.5 cm) Weight: 187 lb (84.8 kg) IBW/kg (Calculated) : 50.1 Heparin Dosing Weight: 70 kg   Vital Signs: Temp: 99.2 F (37.3 C) (10/20 1155) Temp Source: Oral (10/20 1155) BP: 140/72 (10/20 1000) Pulse Rate: 73 (10/20 1000)  Labs:  Recent Labs  09/26/17 2302 09/27/17 0336 09/27/17 1022  HGB  --  11.8*  --   HCT  --  37.3  --   PLT  --  159  --   HEPARINUNFRC  --   --  0.40  CREATININE  --  0.92  --   TROPONINI 3.67* 12.90*  --     Estimated Creatinine Clearance: 55.8 mL/min (by C-G formula based on SCr of 0.92 mg/dL).   Medical History: Past Medical History:  Diagnosis Date  . Diabetes mellitus without complication (HCC)   . High cholesterol   . Hypertension   . 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 Stroke [facility]) 07/2017    Medications:  Prescriptions Prior to Admission  Medication Sig Dispense Refill Last Dose  . aspirin 325 MG EC tablet Take 1 tablet (325 mg total) by mouth daily. 30 tablet 0 09/26/2017 at 0930  . atorvastatin (LIPITOR) 40 MG tablet Take 1 tablet (40 mg total) by mouth daily at 6 PM. 30 tablet 6 09/25/2017  . Biotin 1000 MCG tablet Take 1,000 mcg by mouth daily.   09/25/2017  . cholecalciferol (VITAMIN D) 1000 units tablet Take 1,000 Units by mouth daily.   09/25/2017  . clopidogrel (PLAVIX) 75 MG tablet Take 1 tablet (75 mg total) by mouth daily. 30 tablet 5 09/26/2017 at 0930  . ibuprofen (ADVIL,MOTRIN) 200 MG tablet Take 400 mg by mouth every 6 (six) hours as needed for headache or moderate pain.   prn  . LANTUS SOLOSTAR 100 UNIT/ML Solostar Pen Inject 35 Units into the skin 2 (two) times daily.   09/25/2017  . losartan (COZAAR) 50 MG tablet Take 1 tablet (50 mg total) by mouth daily. 60 tablet 6 09/25/2017  .  metFORMIN (GLUCOPHAGE-XR) 500 MG 24 hr tablet Take 2 tablets (1,000 mg total) by mouth daily with breakfast. 60 tablet 0 09/24/2017    Assessment: 53 YOF with h/o Afib s/p LHC with DES placement. Patient developed atrial fibrillation during procedure. Troponin continues to increase (3.67>12.9).   Sheath removed at 1655 and heparin was restarted this morning at 0200. Heparin level 8 hours after start came back within goal range at 0.4 on a rate of 850 units/hr. CBC is within normal limits. No signs/symptoms of bleeding. No infusion issues noted by nursing.   Goal of Therapy:  Heparin level 0.3-0.7 units/ml Monitor platelets by anticoagulation protocol: Yes   Plan:  -Continue IV heparin at 850 units/hr.  -Confirmatory level today at 1800 -Monitor daily HL, CBC and s/s of bleeding  -F/u transition to oral anticoag  Girard Cooter, PharmD Clinical Pharmacist  Phone: [phone]

## 2017-09-28 ENCOUNTER — Encounter (HOSPITAL_COMMUNITY): Payer: Self-pay

## 2017-09-28 LAB — CBC
HCT: 37.2 % (ref 36.0–46.0)
Hemoglobin: 11.8 g/dL — ABNORMAL LOW (ref 12.0–15.0)
MCH: 29.6 pg (ref 26.0–34.0)
MCHC: 31.7 g/dL (ref 30.0–36.0)
MCV: 93.2 fL (ref 78.0–100.0)
PLATELETS: 157 10*3/uL (ref 150–400)
RBC: 3.99 MIL/uL (ref 3.87–5.11)
RDW: 13.6 % (ref 11.5–15.5)
WBC: 9.6 10*3/uL (ref 4.0–10.5)

## 2017-09-28 LAB — GLUCOSE, CAPILLARY
Glucose-Capillary: 190 mg/dL — ABNORMAL HIGH (ref 65–99)
Glucose-Capillary: 162 mg/dL — ABNORMAL HIGH (ref 65–99)
Glucose-Capillary: 226 mg/dL — ABNORMAL HIGH (ref 65–99)
Glucose-Capillary: 116 mg/dL — ABNORMAL HIGH (ref 65–99)
Glucose-Capillary: 235 mg/dL — ABNORMAL HIGH (ref 65–99)

## 2017-09-28 LAB — HEPARIN LEVEL (UNFRACTIONATED): HEPARIN UNFRACTIONATED: 0.13 [IU]/mL — AB (ref 0.30–0.70)

## 2017-09-28 MED ORDER — RIVAROXABAN 15 MG PO TABS
15.0000 mg | ORAL_TABLET | Freq: Every day | ORAL | Status: DC
  Administered 2017-09-28 – 2017-09-29 (×2): 15 mg via ORAL
  Filled 2017-09-28 (×3): qty 1

## 2017-09-28 NOTE — Progress Notes (Signed)
Progress Note  Patient Name: Carrie Warner Date of Encounter: 09/28/2017  Primary Cardiologist: Carrie Warner     Patient Profile     71 y.o. female  admitted for catheterization following abnormal stress test associated with dyspnea. She has hypertension and hypertensive heart disease; she has diabetes.. She has a history of a stroke thought cryptogenic but now with atrial fibrillation so (19 Catheterization tandem LAD lesions treated with atherectomy/stent; atherectomy/POBA Cx by no reflow Myoview LVEF 51% Subjective   Much better this am with minimal chest pain and no sob  Inpatient Medications    Scheduled Meds: . aspirin  81 mg Oral Daily  . atorvastatin  40 mg Oral q1800  . clopidogrel  75 mg Oral Daily  . Influenza vac split quadrivalent PF  0.5 mL Intramuscular Tomorrow-1000  . insulin aspart  0-20 Units Subcutaneous TID WC  . insulin aspart  0-5 Units Subcutaneous QHS  . insulin glargine  35 Units Subcutaneous BID  . losartan  50 mg Oral Daily  . pneumococcal 23 valent vaccine  0.5 mL Intramuscular Tomorrow-1000  . sodium chloride flush  3 mL Intravenous Q12H   Continuous Infusions: . sodium chloride    . heparin 850 Units/hr (09/28/17 0522)   PRN Meds: sodium chloride, acetaminophen, ondansetron (ZOFRAN) IV, sodium chloride flush   Vital Signs    Vitals:   09/28/17 0600 09/28/17 0700 09/28/17 0759 09/28/17 0800  BP: 133/80 138/86  (!) 142/85  Pulse: 75 70  73  Resp: 17 19  (!) 21  Temp:   97.9 F (36.6 C)   TempSrc:   Oral   SpO2: 94% 98%  97%  Weight:      Height:        Intake/Output Summary (Last 24 hours) at 09/28/17 0906 Last data filed at 09/28/17 0800  Gross per 24 hour  Intake            675.5 ml  Output              600 ml  Net             75.5 ml   Filed Weights   09/26/17 1126 09/28/17 0401  Weight: 187 lb (84.8 kg) 186 lb (84.4 kg)    Telemetry     Personally reviewed  NSR without sign arrhythmia  ECG    10/19 2153  Sinus and  normal  10/20 0745 Sinus with new STE [id number] with t wave inversion  - Personally Reviewed 10/21 still pendign   Physical Exam   Well developed and nourished in no acute distress HENT normal Neck supple with JVP-flat Clear No chest wall tenderness  Regular rate and rhythm, no murmurs or gallops Abd-soft with active BS without hepatomegaly No Clubbing cyanosis edema Skin-warm and dry A & Oriented  Grossly normal sensory and motor function   Labs    Chemistry  Recent Labs Lab 09/22/17 1057 09/27/17 0336  NA 139 137  K 4.3 4.4  CL 97 102  CO2 26 23  GLUCOSE 349* 301*  BUN 11 11  CREATININE 0.88 0.92  CALCIUM 10.0 8.8*  GFRNONAA 66 >60  GFRAA 76 >60  ANIONGAP  --  12     Hematology  Recent Labs Lab 09/22/17 1057 09/27/17 0336 09/28/17 0708  WBC 5.9 10.0 9.6  RBC 4.38 4.01 3.99  HGB 13.3 11.8* 11.8*  HCT 40.5 37.3 37.2  MCV 93 93.0 93.2  MCH 30.4 29.4 29.6  MCHC 32.8 31.6 31.7  RDW 13.4 13.4 13.6  PLT 196 159 157    Cardiac Enzymes  Recent Labs Lab 09/26/17 2302 09/27/17 0336  TROPONINI 3.67* 12.90*   No results for input(s): TROPIPOC in the last 168 hours.   BNPNo results for input(s): BNP, PROBNP in the last 168 hours.   DDimer No results for input(s): DDIMER in the last 168 hours.   Radiology    No results found.  Cardiac Studies      Assessment & Plan    CAD s/p revascularizaiton now with Chhest pain atypical/+ Tn  Abnormal ECG-new HTN DM Stroke-8/16 Afib paroxysmal    Will transition from heparin to DOAC  uptodate Carrie Warner) preferentially recommends dabigitran or Rivaroxaban (15 mg)   Not sure we know right answer but with history of prior stroke anticoagulation with this atrial fibrillation now identified is I think indicated. The family is not here, but I will begin her on Rivaroxaban today on the lower dose discussions with the family can be had  No recurrent chest pain  Blood pressure improved but still modestly  elevated  Transferred to the floor mobilize  Has concerns regarding home health aides     Signed, Carrie Manges, MD  09/28/2017, 9:06 AM

## 2017-09-28 NOTE — Progress Notes (Addendum)
ANTICOAGULATION CONSULT NOTE - Follow-up Consult  Pharmacy Consult for heparin Indication: atrial fibrillation  No Known Allergies  Patient Measurements: Height: 5\' 2"  (157.5 cm) Weight: 186 lb (84.4 kg) IBW/kg (Calculated) : 50.1 Heparin Dosing Weight: 70 kg   Vital Signs: Temp: 97.9 F (36.6 C) (10/21 0759) Temp Source: Oral (10/21 0759) BP: 142/85 (10/21 0800) Pulse Rate: 73 (10/21 0800)  Labs:  Recent Labs  09/26/17 2302 09/27/17 0336 09/27/17 1022 09/27/17 1810 09/28/17 0708  HGB  --  11.8*  --   --  11.8*  HCT  --  37.3  --   --  37.2  PLT  --  159  --   --  157  HEPARINUNFRC  --   --  0.40 0.49 0.13*  CREATININE  --  0.92  --   --   --   TROPONINI 3.67* 12.90*  --   --   --     Estimated Creatinine Clearance: 55.7 mL/min (by C-G formula based on SCr of 0.92 mg/dL).   Medical History: Past Medical History:  Diagnosis Date  . Diabetes mellitus without complication (HCC)   . High cholesterol   . Hypertension   . 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 Stroke [facility](HCC) 07/2017    Medications:  Prescriptions Prior to Admission  Medication Sig Dispense Refill Last Dose  . aspirin 325 MG EC tablet Take 1 tablet (325 mg total) by mouth daily. 30 tablet 0 09/26/2017 at 0930  . atorvastatin (LIPITOR) 40 MG tablet Take 1 tablet (40 mg total) by mouth daily at 6 PM. 30 tablet 6 09/25/2017  . Biotin 1000 MCG tablet Take 1,000 mcg by mouth daily.   09/25/2017  . cholecalciferol (VITAMIN D) 1000 units tablet Take 1,000 Units by mouth daily.   09/25/2017  . clopidogrel (PLAVIX) 75 MG tablet Take 1 tablet (75 mg total) by mouth daily. 30 tablet 5 09/26/2017 at 0930  . ibuprofen (ADVIL,MOTRIN) 200 MG tablet Take 400 mg by mouth every 6 (six) hours as needed for headache or moderate pain.   prn  . LANTUS SOLOSTAR 100 UNIT/ML Solostar Pen Inject 35 Units into the skin 2 (two) times daily.   09/25/2017  . losartan  (COZAAR) 50 MG tablet Take 1 tablet (50 mg total) by mouth daily. 60 tablet 6 09/25/2017  . metFORMIN (GLUCOPHAGE-XR) 500 MG 24 hr tablet Take 2 tablets (1,000 mg total) by mouth daily with breakfast. 60 tablet 0 09/24/2017    Assessment: [age] YOF with h/o Afib s/p LHC with DES placement. Patient developed atrial fibrillation during procedure. Troponin continues to increase (3.67>12.9).   Repeat confirmatory heparin level had been therapeutic last night. There were issues with IV line being removed 0600 today, and replaced around 0700. Heparin infusion is running in right arm. Level this morning came back below goal range, at 0.13. Given IV issues, will repeat level prior to making any adjustments from time of new IV line. CBC is within normal limits. No signs/symptoms of bleeding.   Goal of Therapy:  Heparin level 0.3-0.7 units/ml Monitor platelets by anticoagulation protocol: Yes   Plan:  -Continue IV heparin at 850 units/hr.  -Repeat level today at 1300 -Monitor daily HL, CBC and s/s of bleeding  -F/u transition to oral anticoag  Girard CooterKimberly Perkins, PharmD Clinical Pharmacist  Phone: [phone]  ADDENDUM Orders placed for Xarelto. Will transition from heparin infusion to Xarelto. Can start oral agent at time of discontinuation of heparin infusion.  In clinical trials, total body weight was used for CrCl calculation (CrCl 75 mL/min). However, should be dosed at 15 mg once daily in combination with clopidogrel, which the patient is currently receiving as a component of triple therapy.   1) Discontinue heparin infusion 2) Start Xarelto 15 mg daily at time of discontinuation  3) Monitor renal function and signs/symptoms of bleeding.  Girard Cooter, PharmD

## 2017-09-29 ENCOUNTER — Telehealth: Payer: Self-pay | Admitting: Internal Medicine

## 2017-09-29 ENCOUNTER — Encounter (HOSPITAL_COMMUNITY): Payer: Self-pay | Admitting: Cardiology

## 2017-09-29 DIAGNOSIS — R9439 Abnormal result of other cardiovascular function study: Secondary | ICD-10-CM

## 2017-09-29 DIAGNOSIS — E785 Hyperlipidemia, unspecified: Secondary | ICD-10-CM

## 2017-09-29 DIAGNOSIS — E118 Type 2 diabetes mellitus with unspecified complications: Secondary | ICD-10-CM

## 2017-09-29 DIAGNOSIS — I2102 ST elevation (STEMI) myocardial infarction involving left anterior descending coronary artery: Secondary | ICD-10-CM

## 2017-09-29 DIAGNOSIS — Z794 Long term (current) use of insulin: Secondary | ICD-10-CM

## 2017-09-29 DIAGNOSIS — Z955 Presence of coronary angioplasty implant and graft: Secondary | ICD-10-CM

## 2017-09-29 DIAGNOSIS — I4891 Unspecified atrial fibrillation: Secondary | ICD-10-CM

## 2017-09-29 DIAGNOSIS — I10 Essential (primary) hypertension: Secondary | ICD-10-CM

## 2017-09-29 DIAGNOSIS — I25119 Atherosclerotic heart disease of native coronary artery with unspecified angina pectoris: Secondary | ICD-10-CM

## 2017-09-29 DIAGNOSIS — I951 Orthostatic hypotension: Secondary | ICD-10-CM

## 2017-09-29 LAB — CBC
HEMATOCRIT: 35.5 % — AB (ref 36.0–46.0)
Hemoglobin: 11.1 g/dL — ABNORMAL LOW (ref 12.0–15.0)
MCH: 29.4 pg (ref 26.0–34.0)
MCHC: 31.3 g/dL (ref 30.0–36.0)
MCV: 93.9 fL (ref 78.0–100.0)
Platelets: 152 10*3/uL (ref 150–400)
RBC: 3.78 MIL/uL — AB (ref 3.87–5.11)
RDW: 13.7 % (ref 11.5–15.5)
WBC: 7.3 10*3/uL (ref 4.0–10.5)

## 2017-09-29 LAB — GLUCOSE, CAPILLARY
GLUCOSE-CAPILLARY: 227 mg/dL — AB (ref 65–99)
Glucose-Capillary: 78 mg/dL (ref 65–99)
Glucose-Capillary: 199 mg/dL — ABNORMAL HIGH (ref 65–99)

## 2017-09-29 LAB — MRSA PCR SCREENING: MRSA BY PCR: NEGATIVE

## 2017-09-29 MED ORDER — METOPROLOL TARTRATE 25 MG PO TABS
25.0000 mg | ORAL_TABLET | Freq: Two times a day (BID) | ORAL | Status: DC
  Administered 2017-09-29 – 2017-09-30 (×3): 25 mg via ORAL
  Filled 2017-09-29 (×3): qty 1

## 2017-09-29 MED FILL — Lidocaine HCl IV Inj 20 MG/ML: INTRAVENOUS | Qty: 5 | Status: AC

## 2017-09-29 MED FILL — Adenosine IV Soln 12 MG/4ML: INTRAVENOUS | Qty: 4 | Status: AC

## 2017-09-29 NOTE — Telephone Encounter (Signed)
New Message  Pt daughter call requesting to speak with RN. She states pt is in the hospital would like to know if Dr. Rennis Golden will be coming to see her or reviewing hospital notes. Please call back to discuss

## 2017-09-29 NOTE — Discharge Instructions (Signed)

## 2017-09-29 NOTE — Progress Notes (Signed)
Progress Note  Patient Name: Carrie Warner Date of Encounter: 09/29/2017  Primary Cardiologist: Hilty  Subjective   Feeling well today. No chest pain, ambulated in the hallway yesterday.  No PND, orthopnea or exertional dyspnea  Inpatient Medications    Scheduled Meds: . aspirin  81 mg Oral Daily  . atorvastatin  40 mg Oral q1800  . clopidogrel  75 mg Oral Daily  . Influenza vac split quadrivalent PF  0.5 mL Intramuscular Tomorrow-1000  . insulin aspart  0-20 Units Subcutaneous TID WC  . insulin aspart  0-5 Units Subcutaneous QHS  . insulin glargine  35 Units Subcutaneous BID  . losartan  50 mg Oral Daily  . metoprolol tartrate  25 mg Oral BID  . pneumococcal 23 valent vaccine  0.5 mL Intramuscular Tomorrow-1000  . rivaroxaban  15 mg Oral Q supper  . sodium chloride flush  3 mL Intravenous Q12H   Continuous Infusions: . sodium chloride     PRN Meds: sodium chloride, acetaminophen, ondansetron (ZOFRAN) IV, sodium chloride flush   Vital Signs    Vitals:   09/29/17 1400 09/29/17 1500 09/29/17 1530 09/29/17 1555  BP: 136/84 (!) 112/50 127/73   Pulse: 74 72 72   Resp: 20 (!) 22    Temp:    98.4 F (36.9 C)  TempSrc:    Oral  SpO2: 97% 96%    Weight:      Height:        Intake/Output Summary (Last 24 hours) at 09/29/17 1851 Last data filed at 09/29/17 1300  Gross per 24 hour  Intake              490 ml  Output                0 ml  Net              490 ml   Filed Weights   09/26/17 1126 09/28/17 0401  Weight: 187 lb (84.8 kg) 186 lb (84.4 kg)    Telemetry    SR- Personally Reviewed  Physical Exam   General: Well developed, well nourished, pleasant AA female appearing in no acute distress. No signs or symptoms of confusion. Head: Normocephalic, atraumatic.  Neck: Supple without bruits, JVD. Lungs:  Resp regular and unlabored, CTA. Heart: RRR, S1, S2, no S3, S4, or murmur; no rub. Abdomen: Soft, non-tender, non-distended with normoactive bowel  sounds. No hepatomegaly. No rebound/guarding. No obvious abdominal masses. Extremities: No clubbing, cyanosis, edema. Distal pedal pulses are 2+ bilaterally. Neuro: Alert and oriented X 3. Moves all extremities spontaneously. Psych: Normal affect.  Labs    Chemistry  Recent Labs Lab 09/27/17 0336  NA 137  K 4.4  CL 102  CO2 23  GLUCOSE 301*  BUN 11  CREATININE 0.92  CALCIUM 8.8*  GFRNONAA >60  GFRAA >60  ANIONGAP 12     Hematology  Recent Labs Lab 09/27/17 0336 09/28/17 0708 09/29/17 0535  WBC 10.0 9.6 7.3  RBC 4.01 3.99 3.78*  HGB 11.8* 11.8* 11.1*  HCT 37.3 37.2 35.5*  MCV 93.0 93.2 93.9  MCH 29.4 29.6 29.4  MCHC 31.6 31.7 31.3  RDW 13.4 13.6 13.7  PLT 159 157 152    Cardiac Enzymes  Recent Labs Lab 09/26/17 2302 09/27/17 0336  TROPONINI 3.67* 12.90*   No results for input(s): TROPIPOC in the last 168 hours.   BNPNo results for input(s): BNP, PROBNP in the last 168 hours.   DDimer No results for input(s):  DDIMER in the last 168 hours.    Radiology    No results found.  Cardiac Studies   Cath: 09/26/17  Conclusion     Ost LAD lesion, 25 %stenosed.  Prox Cx lesion, 25 %stenosed.  Mid Cx lesion, 30 %stenosed.  The left ventricular systolic function is normal.  LV end diastolic pressure is normal.  The left ventricular ejection fraction is 55-65% by visual estimate.  There is no aortic valve stenosis.  Mid LAD-2 lesion, 90 %stenosed. After atherectomyand PTCA, there was no residual stenosis.  Post intervention, there is a 0% residual stenosis.  Mid LAD-1 lesion, 70 %stenosed. THis was treated with atherectomy, PTCA and stent placement.  A STENT SIERRA 3.00 X 18 MM drug eluting stent was successfully placed.  Post intervention, there is a 0% residual stenosis.  There was transient no reflow due to the large area of debulking. This caused acute ischemia, bt improved with IC adenosine.   Watch in ICU.  COntinue ASA and Plavix.     Patient had AFib during the procedure.  Will start IV heparin 6 hours post sheath pull if it persists.  She is somewhat frail and am not sure if she would be a candidate for longterm anticoagulation.    I suspect she will need to stay in the hospital until at least Sunday.  She had some confusion during the procedure with even small dose of versed and Fentanyl.     Patient Profile     71  y.o. female with PMH of HTN, HLD, Type 2 DM, and recent CVA (in 07/2017) and chest pain who presented for outpatient cath. Had DES x3 to LAD, Afib in the cath lab.   Assessment & Plan    1. CAD: S/p DES x3 to LAD with transient no reflow 2/2 to large area of debulking, with plans for ASA/plavix. No further chest pain. Worked with cardiac rehab and did well. No further chest pain. Suspect she could transfer to telemetry today. Now with Afib, will likely be on triple therapy for one month, then plan to stop ASA.   2. PAF: Developed episode in cath lab and then again post cath. Was started on Xarelto 15mg  daily yesterday. No further Afib noted on telemetry. CHA2DS2-VASc Score of at least 6  3. HTN: Well controlled on current therapy, though not on BB? Adding low dose Metoprolol 25 mg BID  4. DM: SSI while inpatient  5. CVA: noted back in 8/18. Now with Afib noted this admission.   Signed, Bryan Lemma, MD  09/29/2017, 6:51 PM  Pager # [phone]    I have seen, examined and evaluated the patient this AM along with Laverda Page, NP-C.  After reviewing all the available data and chart, we discussed the patients laboratory, study & physical findings as well as symptoms in detail. I agree with her findings, examination as well as impression recommendations as per our discussion.    Attending adjustments noted in italics.   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 No further angina after very complex, difficult PCI with atherectomy of a large portion of the LAD. Unfortunately only able to place a stent in the midportion at the most significant lesion. She did suffer a periprocedural infarct (type IVa) due to slow reflow down the ED after extensive atherectomy.   She is not having any residual anginal symptoms or heart failure symptoms.  Continue aspirin and Plavix for one month and then stop aspirin.  On stable dose of losartan, now adding beta blocker -- monitor for orthostatic hypotension  On statin  She also went into atrial fibrillation during her PCI, but has not had any recurrence post. She has been started on Xarelto. - Agree with adding beta blocker.  Question if this is the actual etiology for her stroke several months ago.   On insulin Lantus with sliding scale. -Return to home dosing for discharge.  She is ambulated well with cardiac rehabilitation. I would like to see her one more day to gain her strength and then anticipate potentially discharge tomorrow.  She can be transferred out of the ICU today.   Bryan Lemmaavid Harding, M.D., M.S. Interventional Cardiologist   Pager # [phone] Phone # [phone] [address] [place], [place] [address]     For questions or updates, please contact [facility] Please consult [email or web address] for contact info under Cardiology/STEMI. Daytime calls, contact the Day Call APP (6a-8a) or assigned team (Teams  A-D) provider (7:30a - 5p). All other daytime calls (7:30-5p), contact the Card Master @ [phone].   Nighttime calls, contact the assigned APP (5p-8p) or MD (6:30p-8p). Overnight calls (8p-6a), contact the on call Fellow @ [phone].

## 2017-09-29 NOTE — Telephone Encounter (Signed)
Patient has been made aware that Dr. Rennis Golden is out right now but her mother is being seen by physicians from the [facility] group. She verbalized her understanding.

## 2017-09-29 NOTE — Care Management Note (Signed)
Case Management Note  Patient Details  Name: Carrie Warner MRN: [id number] Date of Birth: [birth year]  Subjective/Objective:   From home, her daughter Welford Roche will be with her at discharge, was admitted for cath following  An abnormal stress test with dyspnea.  She has hx of cva, htn, and dm, now has afib after cath.  Starting on xarelto, NCM  Awaiting benefit check for xarelto, NCM gave patient the xarelto 30 day savings card.  Her daughter states they will be going to [facility] on [address] (preferred pharmacy) to get the 30 day free.                Action/Plan: NCM will follow for dc needs.   Expected Discharge Date:  09/29/17               Expected Discharge Plan:  Home/Self Care  In-House Referral:     Discharge planning Services  CM Consult  Post Acute Care Choice:    Choice offered to:     DME Arranged:    DME Agency:     HH Arranged:    HH Agency:     Status of Service:  In process, will continue to follow  If discussed at Long Length of Stay Meetings, dates discussed:    Additional Comments:  Leone Haven, RN 09/29/2017, 4:55 PM
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
## 2017-09-29 NOTE — Progress Notes (Signed)
  #   11.  S/ W  JOCELYN @ [facility] # [phone] OPT- 2    XARELTO 15 MG DAILY   COVER- YES  CO-PAY- $ 8.35  PRIOR APPROVAL- NO   PREFERRED PHARMACY : [facility]

## 2017-09-30 ENCOUNTER — Ambulatory Visit (HOSPITAL_COMMUNITY): Payer: Medicare HMO

## 2017-09-30 ENCOUNTER — Telehealth: Payer: Self-pay | Admitting: Internal Medicine

## 2017-09-30 DIAGNOSIS — R0609 Other forms of dyspnea: Secondary | ICD-10-CM

## 2017-09-30 LAB — GLUCOSE, CAPILLARY
GLUCOSE-CAPILLARY: 162 mg/dL — AB (ref 65–99)
GLUCOSE-CAPILLARY: 81 mg/dL (ref 65–99)
Glucose-Capillary: 159 mg/dL — ABNORMAL HIGH (ref 65–99)

## 2017-09-30 LAB — CBC
HEMATOCRIT: 33.9 % — AB (ref 36.0–46.0)
Hemoglobin: 11 g/dL — ABNORMAL LOW (ref 12.0–15.0)
MCH: 30.2 pg (ref 26.0–34.0)
MCHC: 32.4 g/dL (ref 30.0–36.0)
MCV: 93.1 fL (ref 78.0–100.0)
PLATELETS: 150 10*3/uL (ref 150–400)
RBC: 3.64 MIL/uL — AB (ref 3.87–5.11)
RDW: 13.7 % (ref 11.5–15.5)
WBC: 9.1 10*3/uL (ref 4.0–10.5)

## 2017-09-30 MED ORDER — RIVAROXABAN 15 MG PO TABS: 15.0000 mg | ORAL_TABLET | Freq: Every day | ORAL | 0 refills | Status: DC

## 2017-09-30 MED ORDER — NITROGLYCERIN 0.4 MG SL SUBL: 0.4000 mg | SUBLINGUAL_TABLET | SUBLINGUAL | 2 refills | Status: AC | PRN

## 2017-09-30 MED ORDER — ASPIRIN 81 MG PO CHEW: 81.0000 mg | CHEWABLE_TABLET | Freq: Every day | ORAL | Status: DC

## 2017-09-30 MED ORDER — METOPROLOL TARTRATE 25 MG PO TABS: 25.0000 mg | ORAL_TABLET | Freq: Two times a day (BID) | ORAL | 1 refills | Status: DC

## 2017-09-30 NOTE — Progress Notes (Signed)
Patient discharged to home with daughter and nephew. All personal items accounted for. Patient taken to car by NT. All discharge instructions given and questions answered. IV removed, clean, dry, intact.

## 2017-09-30 NOTE — Telephone Encounter (Signed)
New message    TOC appt on 10/09/17 at 345pm with Dr. Rennis Golden per Laverda Page.

## 2017-09-30 NOTE — Progress Notes (Signed)
CARDIAC REHAB PHASE I   PRE:  Rate/Rhythm: 60 SR    BP: sitting 137/74    SaO2:   MODE:  Ambulation: 270 ft   POST:  Rate/Rhythm: 69 SR    BP: sitting 103/87     SaO2:   Pt ambulated with min assist. She sways at times. I suggest a cane for home to help with stability (she did not do well with RW yesterday). Daughter agrees and also sts that the pt is never by herself. No questions regarding education.  [id number]  Carrie Warner CES, ACSM 09/30/2017 12:02 PM

## 2017-09-30 NOTE — Discharge Summary (Signed)
Discharge Summary    Patient ID: Carrie Warner,  MRN: [id number], DOB/AGE: Apr 22, 1946 71 y.o.  Admit date: 09/26/2017 Discharge date: 09/30/2017  Primary Care Provider: Jackie Plum Primary Cardiologist: [facility]  Discharge Diagnoses    Principal Problem:   Coronary artery disease involving native coronary artery of native heart with angina pectoris [facility]) Active Problems:   Orthostatic hypotension   Essential hypertension   DM (diabetes mellitus), type 2 with complications (HCC)   Hyperlipidemia with target LDL less than 70   DOE (dyspnea on exertion)   Abnormal nuclear stress test   New onset atrial fibrillation (HCC)   Presence of drug coated stent in LAD coronary artery: STENT SIERRA 3.00 X 18 MM drug eluting stent   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  Prox Cx lesion, 25 %stenosed.  Mid Cx lesion, 30 %stenosed.  The left ventricular systolic function is normal.  LV end diastolic pressure is normal.  The left ventricular ejection fraction is 55-65% by visual estimate.  There is no aortic valve stenosis.  Mid LAD-2 lesion, 90 %stenosed. After atherectomyand PTCA, there was no residual stenosis.  Post intervention, there is a 0% residual stenosis.  Mid LAD-1 lesion, 70 %stenosed. THis was treated with atherectomy, PTCA and stent placement.  A STENT SIERRA 3.00 X 18 MM drug eluting stent was successfully placed.  Post intervention, there is a 0% residual stenosis.  There was transient no reflow due to the large area of debulking. This caused acute ischemia, bt improved with IC adenosine.   Watch in ICU.  COntinue ASA and Plavix.    Patient had AFib during the procedure.  Will start IV heparin 6 hours post sheath pull if it persists.  She is  somewhat frail and am not sure if she would be a candidate for longterm anticoagulation.    I suspect she will need to stay in the hospital until at least Sunday.  She had some confusion during the procedure with even small dose of versed and Fentanyl.    _____________   History of Present Illness     Carrie Warner is a 71 year old female with past medical history of hypertension, hyperlipidemia, diabetes, and recent CVA (8/18) who was seen in the office on 09/22/17 for follow-up of recent stress test which showed medium defects of mild severity present in the mid anterior septal, apical anterior, and apical septal location which was reversible consistent with mild ischemia. At this appointment she reported she continued to experience episodes of dyspnea on exertion and fatigue. Given her recent abnormal stress test, and ongoing symptoms concerning for worsening CAD she was set up for outpatient cardiac catheterization.  Hospital Course     She presented for outpatient cardiac catheterization with Dr. Eldridge Dace noted above with proximal/mid LAD lesion treated with PCI/DES 3, with transient no reflow secondary to large area of debulking. During procedure she developed atrial fibrillation, and was started on IV heparin post sheath pull. At that time it was unclear whether she would be a candidate for long-term anticoagulation as she had some confusion during her procedure. Post cath was given IV Lopressor, and converted back to sinus rhythm. Of note she had a recent CVA back in August of this year, which was initially felt to be cryptogenic, but with development of atrial  fibrillation suspect this may been source. She was seen by Dr. Graciela Husbands over the weekend post cath and started on the relative 50 mg daily. Her chads score was at least 6. Given the findings of A. fib she will be on triple therapy with aspirin and Plavix and the relative for 1 month, then plan to DC aspirin at that time. She initially had  some chest pain post cath, but this improved and was able to work with cardiac rehabilitation with significant improvement. She was on good medical therapy with aspirin, statin, Plavix, beta blocker and ARB. Case management followed with medications assessment needs.  Carrie Warner was seen by Dr. Herbie Baltimore and determined stable for discharge home. Follow up in the office has been arranged. Medications are listed below.   _____________  Discharge Vitals Blood pressure 122/64, pulse 63, temperature 97.7 F (36.5 C), temperature source Oral, resp. rate (!) 24, height 5\' 2"  (1.575 m), weight 194 lb 10.7 oz (88.3 kg), SpO2 (!) 89 %.  Filed Weights   09/26/17 1126 09/28/17 0401 09/30/17 0600  Weight: 187 lb (84.8 kg) 186 lb (84.4 kg) 194 lb 10.7 oz (88.3 kg)   General appearance: alert, cooperative, appears stated age, moderately obese and well groomed. Neck: no adenopathy, no carotid bruit and no JVD Lungs: clear to auscultation bilaterally, normal percussion bilaterally and non-labored Heart: regular rate and rhythm, S1, S2 normal, no murmur, click, rub or gallop and normal apical impulse Abdomen: soft, non-tender; bowel sounds normal; no masses,  no organomegaly Extremities: extremities normal, atraumatic, no cyanosis or edema Pulses: 2+ and symmetric Skin: Skin color, texture, turgor normal. No rashes or lesions Neurologic: Grossly normal   Labs & Radiologic Studies    CBC  Recent Labs  09/29/17 0535 09/30/17 0234  WBC 7.3 9.1  HGB 11.1* 11.0*  HCT 35.5* 33.9*  MCV 93.9 93.1  PLT 152 150   Basic Metabolic Panel No results for input(s): NA, K, CL, CO2, GLUCOSE, BUN, CREATININE, CALCIUM, MG, PHOS in the last 72 hours. Liver Function Tests No results for input(s): AST, ALT, ALKPHOS, BILITOT, PROT, ALBUMIN in the last 72 hours. No results for input(s): LIPASE, AMYLASE in the last 72 hours. Cardiac Enzymes No results for input(s): CKTOTAL, CKMB, CKMBINDEX, TROPONINI in the last  72 hours. BNP Invalid input(s): POCBNP D-Dimer No results for input(s): DDIMER in the last 72 hours. Hemoglobin A1C No results for input(s): HGBA1C in the last 72 hours. Fasting Lipid Panel No results for input(s): CHOL, HDL, LDLCALC, TRIG, CHOLHDL, LDLDIRECT in the last 72 hours. Thyroid Function Tests No results for input(s): TSH, T4TOTAL, T3FREE, THYROIDAB in the last 72 hours.  Invalid input(s): FREET3 _____________  No results found. Disposition   Pt is being discharged home today in good condition.  Follow-up Plans & Appointments    Follow-up Information    Chrystie Nose, MD Follow up on 10/09/2017.   Specialty:  Cardiology Why:  at 3:45pm for your follow up appt.  Contact information: [address] [place] [address] [phone]          Discharge Instructions    Amb Referral to Cardiac Rehabilitation    Complete by:  As directed    Diagnosis:  Coronary Stents   Call MD for:  redness, tenderness, or signs of infection (pain, swelling, redness, odor or green/yellow discharge around incision site)    Complete by:  As directed    Diet - low sodium heart healthy    Complete by:  As directed    Discharge instructions    Complete by:  As directed    Radial Site Care Refer to this sheet in the next few weeks. These instructions provide you with information on caring for yourself after your procedure. Your caregiver may also give you more specific instructions. Your treatment has been planned according to current medical practices, but problems sometimes occur. Call your caregiver if you have any problems or questions after your procedure. HOME CARE INSTRUCTIONS You may shower the day after the procedure.Remove the bandage (dressing) and gently wash the site with plain soap and water.Gently pat the site dry.  Do not apply powder or lotion to the site.  Do not submerge the affected site in water for 3 to 5 days.  Inspect the site at least twice  daily.  Do not flex or bend the affected arm for 24 hours.  No lifting over 5 pounds (2.3 kg) for 5 days after your procedure.  Do not drive home if you are discharged the same day of the procedure. Have someone else drive you.  You may drive 24 hours after the procedure unless otherwise instructed by your caregiver.  What to expect: Any bruising will usually fade within 1 to 2 weeks.  Blood that collects in the tissue (hematoma) may be painful to the touch. It should usually decrease in size and tenderness within 1 to 2 weeks.  SEEK IMMEDIATE MEDICAL CARE IF: You have unusual pain at the radial site.  You have redness, warmth, swelling, or pain at the radial site.  You have drainage (other than a small amount of blood on the dressing).  You have chills.  You have a fever or persistent symptoms for more than 72 hours.  You have a fever and your symptoms suddenly get worse.  Your arm becomes pale, cool, tingly, or numb.  You have heavy bleeding from the site. Hold pressure on the site.   PLEASE DO NOT MISS ANY DOSES OF YOUR PLAVIX!!!!! Also keep a log of you blood pressures and bring back to your follow up appt. Please call the office with any questions.   Patients taking blood thinners should generally stay away from medicines like ibuprofen, Advil, Motrin, naproxen, and Aleve due to risk of stomach bleeding. You may take Tylenol as directed or talk to your primary doctor about alternatives.  You will need to be on triple therapy with aspirin, plavix and Xarelto for one month, then plan to stop aspirin and continue plavix and xarelto. Please watch for signs of bleeding with these medications.   Increase activity slowly    Complete by:  As directed       Discharge Medications     Medication List    STOP taking these medications   aspirin 325 MG EC tablet Replaced by:  aspirin 81 MG chewable tablet   ibuprofen 200 MG tablet Commonly known as:  ADVIL,MOTRIN     TAKE these  medications   aspirin 81 MG chewable tablet Chew 1 tablet (81 mg total) by mouth daily. Replaces:  aspirin 325 MG EC tablet   atorvastatin 40 MG tablet Commonly known as:  LIPITOR Take 1 tablet (40 mg total) by mouth daily at 6 PM.   Biotin 1000 MCG tablet Take 1,000 mcg by mouth daily.   cholecalciferol 1000 units tablet Commonly known as:  VITAMIN D Take 1,000 Units by mouth daily.   clopidogrel 75 MG tablet Commonly known as:  PLAVIX Take 1 tablet (75  mg total) by mouth daily.   LANTUS SOLOSTAR 100 UNIT/ML Solostar Pen Generic drug:  Insulin Glargine Inject 35 Units into the skin 2 (two) times daily.   losartan 50 MG tablet Commonly known as:  COZAAR Take 1 tablet (50 mg total) by mouth daily.   metFORMIN 500 MG 24 hr tablet Commonly known as:  GLUCOPHAGE-XR Take 2 tablets (1,000 mg total) by mouth daily with breakfast.   metoprolol tartrate 25 MG tablet Commonly known as:  LOPRESSOR Take 1 tablet (25 mg total) by mouth 2 (two) times daily.   nitroGLYCERIN 0.4 MG SL tablet Commonly known as:  NITROSTAT Place 1 tablet (0.4 mg total) under the tongue every 5 (five) minutes as needed.   Rivaroxaban 15 MG Tabs tablet Commonly known as:  XARELTO Take 1 tablet (15 mg total) by mouth daily with supper.        Aspirin prescribed at discharge?  Yes High Intensity Statin Prescribed? (Lipitor 40-80mg  or Crestor 20-40mg ): Yes Beta Blocker Prescribed? Yes For EF <40%, was ACEI/ARB Prescribed? Yes ADP Receptor Inhibitor Prescribed? (i.e. Plavix etc.-Includes Medically Managed Patients): Yes For EF <40%, Aldosterone Inhibitor Prescribed? No: EF ok Was EF assessed during THIS hospitalization? Yes Was Cardiac Rehab II ordered? (Included Medically managed Patients): Yes   Outstanding Labs/Studies   N/a  Duration of Discharge Encounter   Greater than 30 minutes including physician time.  Signed, Laverda Page NP-C 09/30/2017, 2:07 PM   I have seen, examined and  evaluated the patient this AM along with Laverda Page, NP-C.  After reviewing all the available data and chart, we discussed the patients laboratory, study & physical findings as well as symptoms in detail. I agree with her findings, examination as well as impression, summary & d/c recommendations as per our discussion.   Attending adjustments noted in italics.   Admitted following very complex LCP-PCI of LAD: extensive Orbital Atherectomy with single stent placement & extensive PTCA.  Due to extent of Atherectomy, there was a short period of no/slow reflow with notable anginal symptoms & resultant Troponin level increase c/s notable Type IVa (periprocedural MI - not ACS/NSTEMI).  She also went into Afib (intermittent RVR) during the PCI - new Dx.   Was kept in the hospital (initially in CCU) for complicated PCI & Type IVa MI along with new Afib. - SCHA2DS2-VASc Score of at least 6: tarted on rivaroxaban along with DAPT (? Can stop ASA after initial 30 days). - Afib rate controlled with escalated Beta Blocker dose  After initially having a slow recovery, she did well by Monday 10/22 & was stable for d/c home 10/23/'18.  Bryan Lemma, M.D., M.S. Interventional Cardiologist   Pager # [phone] Phone # [phone] [address] [place], [place] [address]

## 2017-09-30 NOTE — Progress Notes (Signed)
Paged PA for cardiology regarding discharge order  PA Su Hilt stated to discharge patient and do not transfer

## 2017-09-30 NOTE — Telephone Encounter (Signed)
TOC - current admit 09/30/17

## 2017-10-01 ENCOUNTER — Telehealth (HOSPITAL_COMMUNITY): Payer: Self-pay

## 2017-10-01 NOTE — Telephone Encounter (Signed)
Follow up    Patient daughter returning call. Please call again

## 2017-10-01 NOTE — Telephone Encounter (Signed)
Patient contacted regarding discharge from [facility] on 09/30/17  Patient understands to follow up with provider hilty on 10-09-17 at 3:45 pm at [facility] Patient understands discharge instructions? yes Patient understands medications and regiment? yes Patient understands to bring all medications to this visit? yes

## 2017-10-01 NOTE — Telephone Encounter (Signed)
Left message for pt to call.

## 2017-10-01 NOTE — Telephone Encounter (Signed)
Patient insurance is active and benefits verified. Patient insurance is [employer] - $45.00 co-pay, no deductible, out of pocket $4500/$178.27 has been met, no co-insurance and no pre-authorization. Passport/reference # [id number].  Patient will be contacted and scheduled after their follow up appointment with the cardiologist office on 10/09/17, upon review by [facility] RN navigator.

## 2017-10-06 ENCOUNTER — Encounter: Payer: Medicare HMO | Attending: Internal Medicine | Admitting: Registered"

## 2017-10-06 DIAGNOSIS — E1165 Type 2 diabetes mellitus with hyperglycemia: Secondary | ICD-10-CM | POA: Insufficient documentation

## 2017-10-06 DIAGNOSIS — Z713 Dietary counseling and surveillance: Secondary | ICD-10-CM | POA: Diagnosis not present

## 2017-10-06 DIAGNOSIS — E118 Type 2 diabetes mellitus with unspecified complications: Secondary | ICD-10-CM

## 2017-10-06 NOTE — Progress Notes (Signed)
Diabetes Self-Management Education  Visit Type: Follow-up  Appt. Start Time: 1145 Appt. End Time: 1245  10/07/2017  Ms. Everlene BallsBertha Rittenhouse, identified by name and date of birth, is a 71 y.o. female with a diagnosis of Diabetes: Type 2.   ASSESSMENT This patient is accompanied in the office by her daughter. Patient's daughter states that patient was recently discharged from hospital after having a heart attack and stent placed. Pt presented somnolent during visit, asking her daughter if she could go home and sleep, same presentation as her first visit. Daughter tested her mother's BG: 187 mg/dL during visit.  Pt has been check BG fasting and before meals, but does not affect insulin dose decision. Pt brought in meter and RD noted FBG readings: 111, 102, 91, 176, 73. Pt states her doctor reduced the amount of insulin she takes after breakfast and after dinner.     Diabetes Self-Management Education - 10/06/17 1159      Visit Information   Visit Type Follow-up     Initial Visit   Diabetes Type Type 2   Are you taking your medications as prescribed? Yes     Psychosocial Assessment   Other persons present Patient;Family Member     Complications   Fasting Blood glucose range (mg/dL) 16-109;>60470-129;>200   Postprandial Blood glucose range (mg/dL) --  only has been checking fasting & before meals     Dietary Intake   Breakfast oatmeal, almonds & pecans, coffee   Snack (morning) twizzler   Lunch sandwich OR leftovers   Snack (afternoon) none   Dinner spaghetti, ragu, water   Snack (evening) sm piece of cake (no icing)   Beverage(s) water, coffee, gingerale     Exercise   Exercise Type ADL's   How many days per week to you exercise? 0   How many minutes per day do you exercise? 0   Total minutes per week of exercise 0     Patient Education   Nutrition management  Role of diet in the treatment of diabetes and the relationship between the three main macronutrients and blood glucose  level;Carbohydrate counting;Reviewed blood glucose goals for pre and post meals and how to evaluate the patients' food intake on their blood glucose level.   Monitoring Identified appropriate SMBG and/or A1C goals.   Acute complications Discussed and identified patients' treatment of hyperglycemia.   Personal strategies to promote health Other (comment)  strategies to reduce sodium     Individualized Goals (developed by patient)   Nutrition Follow meal plan discussed   Reducing Risk treat hypoglycemia with 15 grams of carbs if blood glucose less than 70mg /dL     Outcomes   Expected Outcomes Demonstrated interest in learning. Expect positive outcomes   Future DMSE PRN   Program Status Completed    Individualized Plan for Diabetes Self-Management Training:   Learning Objective:  Patient will have a greater understanding of diabetes self-management. Patient education plan is to attend individual and/or group sessions per assessed needs and concerns.  Patient Instructions   When eating carbs, be sure to also eat protein with eat  Aim for 45-60 g carbs per meal and 0-30 grams per snack and have protein with each.  Avoid juice and soda  Greek yogurt has more protein than regular yogurt.  Jif to go peanut butter, or nuts is a convenient protein to carry with you  Continue to check blood sugar in the morning, if you suspect low blood sugar, and before dinner.  Review the Sodium  handout for ideas to lower sodium in your diet  Expected Outcomes:  Demonstrated interest in learning. Expect positive outcomes  Education material provided:  Sodium: get the facts, Carb counting card, My Plate  If problems or questions, patient to contact team via:  Phone  Future DSME appointment: PRN

## 2017-10-06 NOTE — Patient Instructions (Addendum)
   When eating carbs, be sure to also eat protein with eat  Aim for 45-60 g carbs per meal and 0-30 grams per snack and have protein with each.  Avoid juice and soda  Greek yogurt has more protein than regular yogurt.  Jif to go peanut butter, or nuts is a convenient protein to carry with you  Continue to check blood sugar in the morning, if you suspect low blood sugar, and before dinner.  Review the Sodium handout for ideas to lower sodium in your diet

## 2017-10-09 ENCOUNTER — Ambulatory Visit: Payer: Medicare HMO | Admitting: Internal Medicine

## 2017-10-13 DIAGNOSIS — E785 Hyperlipidemia, unspecified: Secondary | ICD-10-CM | POA: Diagnosis not present

## 2017-10-13 DIAGNOSIS — H60502 Unspecified acute noninfective otitis externa, left ear: Secondary | ICD-10-CM | POA: Diagnosis not present

## 2017-10-13 DIAGNOSIS — I10 Essential (primary) hypertension: Secondary | ICD-10-CM | POA: Diagnosis not present

## 2017-10-13 DIAGNOSIS — I639 Cerebral infarction, unspecified: Secondary | ICD-10-CM | POA: Diagnosis not present

## 2017-10-13 DIAGNOSIS — Z0000 Encounter for general adult medical examination without abnormal findings: Secondary | ICD-10-CM | POA: Diagnosis not present

## 2017-10-13 DIAGNOSIS — I4891 Unspecified atrial fibrillation: Secondary | ICD-10-CM | POA: Diagnosis not present

## 2017-10-13 DIAGNOSIS — E1165 Type 2 diabetes mellitus with hyperglycemia: Secondary | ICD-10-CM | POA: Diagnosis not present

## 2017-10-16 ENCOUNTER — Ambulatory Visit: Payer: Medicare HMO | Admitting: Internal Medicine

## 2017-10-16 ENCOUNTER — Ambulatory Visit (HOSPITAL_BASED_OUTPATIENT_CLINIC_OR_DEPARTMENT_OTHER): Payer: Medicare HMO | Attending: Internal Medicine | Admitting: Cardiovascular Disease

## 2017-10-16 VITALS — Ht 62.0 in | Wt 182.0 lb

## 2017-10-16 DIAGNOSIS — G4730 Sleep apnea, unspecified: Secondary | ICD-10-CM | POA: Diagnosis not present

## 2017-10-16 DIAGNOSIS — Z7901 Long term (current) use of anticoagulants: Secondary | ICD-10-CM | POA: Diagnosis not present

## 2017-10-16 DIAGNOSIS — G4719 Other hypersomnia: Secondary | ICD-10-CM

## 2017-10-16 DIAGNOSIS — G4733 Obstructive sleep apnea (adult) (pediatric): Secondary | ICD-10-CM | POA: Insufficient documentation

## 2017-10-16 DIAGNOSIS — Z7982 Long term (current) use of aspirin: Secondary | ICD-10-CM | POA: Diagnosis not present

## 2017-10-16 DIAGNOSIS — G4761 Periodic limb movement disorder: Secondary | ICD-10-CM | POA: Insufficient documentation

## 2017-10-16 DIAGNOSIS — G4710 Hypersomnia, unspecified: Secondary | ICD-10-CM | POA: Diagnosis not present

## 2017-10-16 DIAGNOSIS — Z794 Long term (current) use of insulin: Secondary | ICD-10-CM | POA: Insufficient documentation

## 2017-10-16 DIAGNOSIS — R0681 Apnea, not elsewhere classified: Secondary | ICD-10-CM | POA: Diagnosis present

## 2017-10-16 DIAGNOSIS — Z79899 Other long term (current) drug therapy: Secondary | ICD-10-CM | POA: Insufficient documentation

## 2017-10-16 DIAGNOSIS — G4763 Sleep related bruxism: Secondary | ICD-10-CM | POA: Insufficient documentation

## 2017-10-24 DIAGNOSIS — R69 Illness, unspecified: Secondary | ICD-10-CM | POA: Diagnosis not present

## 2017-10-28 ENCOUNTER — Ambulatory Visit (INDEPENDENT_AMBULATORY_CARE_PROVIDER_SITE_OTHER): Payer: Medicare HMO | Admitting: Internal Medicine

## 2017-10-28 ENCOUNTER — Encounter: Payer: Self-pay | Admitting: Internal Medicine

## 2017-10-28 VITALS — BP 124/74 | HR 74 | Ht 62.0 in | Wt 188.0 lb

## 2017-10-28 DIAGNOSIS — E782 Mixed hyperlipidemia: Secondary | ICD-10-CM | POA: Diagnosis not present

## 2017-10-28 DIAGNOSIS — Z79899 Other long term (current) drug therapy: Secondary | ICD-10-CM

## 2017-10-28 DIAGNOSIS — I10 Essential (primary) hypertension: Secondary | ICD-10-CM | POA: Diagnosis not present

## 2017-10-28 DIAGNOSIS — I2583 Coronary atherosclerosis due to lipid rich plaque: Secondary | ICD-10-CM

## 2017-10-28 DIAGNOSIS — I2510 Atherosclerotic heart disease of native coronary artery without angina pectoris: Secondary | ICD-10-CM

## 2017-10-28 DIAGNOSIS — R0602 Shortness of breath: Secondary | ICD-10-CM | POA: Diagnosis not present

## 2017-10-28 MED ORDER — CLOPIDOGREL BISULFATE 75 MG PO TABS: 75.0000 mg | ORAL_TABLET | Freq: Every day | ORAL | 3 refills | Status: DC

## 2017-10-28 NOTE — Patient Instructions (Addendum)
Medication Instructions STOP Aspirin Continue Plavix & Xarelto  Your physician recommends that you return for lab work in: TODAY - CBC, CMET, BNP  Your physician wants you to follow-up in: 6 months with Dr. Rennis Golden. You will receive a reminder letter in the mail two months in advance. If you don't receive a letter, please call our office to schedule the follow-up appointment.

## 2017-10-28 NOTE — Progress Notes (Signed)
OFFICE NOTE  Chief Complaint:  Recent stroke  Primary Care Physician: Jackie Plumsei-Bonsu, George, MD  HPI:  Carrie Warner is a 71 y.o. female with a past medial history significant for the 2 diabetes, hypertension, dyslipidemia and moderate obesity. Recently she presented to [facility] with symptoms concerning for stroke. She was evaluated and treated for right brain infarct and has some minimal deficits. She's currently on aspirin and Plavix. She was found to have an elevated LDL 126 and started on Lipitor 40 mg daily. She also reports significant fatigue and daytime sleepiness which is worsened since her stroke. Her Epworth sleepiness scale score was 21. She was noted to have episodes of apnea while hospitalized. She denies any chest pain, but reports shortness of breath with exertion and fatigue. This all seems to be somewhat new. [place] and is staying with her children. 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 There is extensive intracranial atherosclerosis. She also reports pain in her legs when walking. She feels that her feet are cold and her symptoms improved somewhat with rest.   10/28/2017  Carrie Warner returns today for follow-up.  Previously I referred her for stress testing which showed an abnormal finding of anterior ischemia.  She was referred for cardiac catheterization with Dr. Herbie BaltimoreHarding and underwent left heart catheterization on September 26, 2017, having been found to have significant stenosis of the LAD.  Unfortunately she suffered a peri-procedural MI and had "no-reflow".  This was complicated by atrial fibrillation.  She spent some time in the ICU and was started on Xarelto in addition to aspirin and Plavix.  Ultimately she was discharged and returns today for follow-up.  She reports being short of breath.  She says this is been somewhat persistent  since her procedure.  Her weight actually is unchanged from her preprocedure weight.  She denies any swelling.  She has no further angina.  EKG shows she is back in sinus rhythm today.  PMHx:  Past Medical History:  Diagnosis Date  . Coronary artery disease involving native coronary artery of native heart with angina pectoris (HCC) 09/26/2017   Cath for Abnormal Myoivew - Anterior Ischemia: Mid LAD-2 lesion, 90 %stenosed. After atherectomyand PTCA, there was no residual stenosis. Post intervention, there is a 0% residual stenosis. Mid LAD-1 lesion, 70 %stenosed. THis was treated with atherectomy, PTCA and stent placement. A STENT SIERRA 3.00 X 18 MM drug eluting stent was successfully placed. Post intervention, there is a 0% residual ste  . Myocardial infarction involving left anterior descending (LAD) coronary artery (HCC) 09/26/2017   Type 4a MI - post PCI  . PAF (paroxysmal atrial fibrillation) (HCC) 09/25/2017   Noted during LAD PCI  . Presence of drug coated stent in LAD coronary artery: STENT SIERRA 3.00 X 18 MM drug eluting stent 09/26/2017   Mid LAD-2 lesion, 90 %stenosed & Mid LAD-1 lesion, 70 %stenosed. Lesions treated with atherectomy, PTCA and stent placement (in worst segment). A STENT SIERRA 3.00 X 18 MM drug eluting stent was successfully placed.   . Stroke (HCC) 07/2017    Past Surgical History:  Procedure Laterality Date  . CORONARY ATHERECTOMY N/A 09/26/2017   Procedure: CORONARY ATHERECTOMY;  Surgeon: Corky CraftsVaranasi, Jayadeep S, MD;  [facility];  Service: Cardiovascular;  Laterality: N/A;  . CORONARY STENT INTERVENTION N/A 09/26/2017   Procedure: CORONARY STENT INTERVENTION;  Surgeon: Corky CraftsVaranasi, Jayadeep S,  MD;  Location: [facility] INVASIVE CV LAB;  Service: Cardiovascular;  Laterality: N/A;  . LEFT HEART CATH AND CORONARY ANGIOGRAPHY N/A 09/26/2017   Procedure: LEFT HEART CATH AND CORONARY ANGIOGRAPHY;  Surgeon: Corky Crafts, MD;  Location: [facility] INVASIVE CV LAB;   Service: Cardiovascular;  Laterality: N/A;    FAMHx:  Family History  Problem Relation Age of Onset  . Alzheimer's disease Mother   . Heart disease Father   . Alcohol abuse Sister   . Stroke Brother     SOCHx:   reports that  has never smoked. she has never used smokeless tobacco. She reports that she does not drink alcohol or use drugs.  ALLERGIES:  No Known Allergies  ROS: Pertinent items noted in HPI and remainder of comprehensive ROS otherwise negative.  HOME MEDS: Current Outpatient Medications on File Prior to Visit  Medication Sig Dispense Refill  . aspirin 81 MG chewable tablet Chew 1 tablet (81 mg total) by mouth daily.    Marland Kitchen atorvastatin (LIPITOR) 40 MG tablet Take 1 tablet (40 mg total) by mouth daily at 6 PM. 30 tablet 6  . Biotin 1000 MCG tablet Take 1,000 mcg by mouth daily.    . cholecalciferol (VITAMIN D) 1000 units tablet Take 1,000 Units by mouth daily.    Marland Kitchen LANTUS SOLOSTAR 100 UNIT/ML Solostar Pen Inject 35 Units into the skin 2 (two) times daily.    Marland Kitchen losartan (COZAAR) 50 MG tablet Take 1 tablet (50 mg total) by mouth daily. 60 tablet 6  . metFORMIN (GLUCOPHAGE-XR) 500 MG 24 hr tablet Take 2 tablets (1,000 mg total) by mouth daily with breakfast. 60 tablet 0  . metoprolol tartrate (LOPRESSOR) 25 MG tablet Take 1 tablet (25 mg total) by mouth 2 (two) times daily. 60 tablet 1  . nitroGLYCERIN (NITROSTAT) 0.4 MG SL tablet Place 1 tablet (0.4 mg total) under the tongue every 5 (five) minutes as needed. 25 tablet 2  . Rivaroxaban (XARELTO) 15 MG TABS tablet Take 1 tablet (15 mg total) by mouth daily with supper. 90 tablet 0   No current facility-administered medications on file prior to visit.     LABS/IMAGING: No results found for this or any previous visit (from the past 48 hour(s)). No results found.  LIPID PANEL:    Component Value Date/Time   CHOL 182 07/21/2017 0329   TRIG 119 07/21/2017 0329   HDL 32 (L) 07/21/2017 0329   CHOLHDL 5.7 07/21/2017  0329   VLDL 24 07/21/2017 0329   LDLCALC 126 (H) 07/21/2017 0329     WEIGHTS: Wt Readings from Last 3 Encounters:  10/28/17 188 lb (85.3 kg)  10/16/17 182 lb (82.6 kg)  09/30/17 194 lb 10.7 oz (88.3 kg)    VITALS: BP 124/74   Pulse 74   Ht 5\' 2"  (1.575 m)   Wt 188 lb (85.3 kg)   BMI 34.39 kg/m   EXAM: General appearance: alert and no distress Neck: no carotid bruit, no JVD and thyroid not enlarged, symmetric, no tenderness/mass/nodules Lungs: clear to auscultation bilaterally Heart: regular rate and rhythm Abdomen: soft, non-tender; bowel sounds normal; no masses,  no organomegaly and Mildly obese Extremities: extremities normal, atraumatic, no cyanosis or edema Pulses: Decreased pedal pulses Skin: Skin color, texture, turgor normal. No rashes or lesions Neurologic: Grossly normal Psych: Pleasant  EKG: Normal sinus rhythm at 74, minimal voltage criteria for LVH, inferior and lateral infarct pattern with anterolateral deep T wave inversions-unchanged from prior-personally reviewed  ASSESSMENT: 1. CAD status  post DES to the LAD-complicated by no reflow, (09/26/2017) 2. Paroxysmal atrial fibrillation- CHADSVASC score of 5 (on Xarelto) 3. Recent stroke with extensive atherosclerosis and intracranial atherosclerosis 4. Type 2 diabetes 5. Hypertension 6. Dyslipidemia 7. Nonrestorative sleep and daytime fatigue 8. Leg pain and cool feet 9. DOE  PLAN: 1.   Carrie Warner had successful PCI to the LAD, which was comp located by no reflow.  She had paroxysmal atrial fibrillation and is now on Xarelto, aspirin and Plavix.  It has been 1 month since her procedure and to avoid triple therapy, would recommend discontinuing her aspirin and continuing Plavix and Xarelto to complete a one-year course after her stent.  She is complaining of persistent dyspnea.  I do not think this is related to A. fib.  There could be anemia or other reasons for her to be dyspneic.  Her EF was normal at  cath.  I like to check lab work including a metabolic profile, CBC and BNP.  She has been started on a high potency statin.  Her LDL was 126.  We will need to repeat this likely when I see her back in follow-up in 6 months.  Chrystie Nose, MD, [facility], FACP  [place]  [facility]  Medical Director of the [facility]  Cardiovascular Risk Reduction Clinic Attending Cardiologist  Direct Dial: [phone]  Fax: [phone]  Website:  [email or web address] Nicely Hopie Pellegrin 10/28/2017, 2:03 PM

## 2017-10-29 ENCOUNTER — Telehealth: Payer: Self-pay | Admitting: Internal Medicine

## 2017-10-29 ENCOUNTER — Telehealth (HOSPITAL_COMMUNITY): Payer: Self-pay

## 2017-10-29 DIAGNOSIS — Z79899 Other long term (current) drug therapy: Secondary | ICD-10-CM

## 2017-10-29 LAB — COMPREHENSIVE METABOLIC PANEL
ALK PHOS: 130 IU/L — AB (ref 39–117)
ALT: 13 IU/L (ref 0–32)
AST: 16 IU/L (ref 0–40)
Albumin/Globulin Ratio: 1 — ABNORMAL LOW (ref 1.2–2.2)
Albumin: 3.8 g/dL (ref 3.5–4.8)
BUN / CREAT RATIO: 13 (ref 12–28)
BUN: 11 mg/dL (ref 8–27)
Bilirubin Total: 0.3 mg/dL (ref 0.0–1.2)
CO2: 26 mmol/L (ref 20–29)
CREATININE: 0.84 mg/dL (ref 0.57–1.00)
Calcium: 9.8 mg/dL (ref 8.7–10.3)
Chloride: 100 mmol/L (ref 96–106)
GFR calc Af Amer: 80 mL/min/{1.73_m2} (ref >59)
GFR calc non Af Amer: 70 mL/min/{1.73_m2} (ref >59)
GLUCOSE: 319 mg/dL — AB (ref 65–99)
Globulin, Total: 3.8 g/dL (ref 1.5–4.5)
Potassium: 4.8 mmol/L (ref 3.5–5.2)
Sodium: 139 mmol/L (ref 134–144)
Total Protein: 7.6 g/dL (ref 6.0–8.5)

## 2017-10-29 LAB — CBC
Hematocrit: 39.4 % (ref 34.0–46.6)
Hemoglobin: 12.3 g/dL (ref 11.1–15.9)
MCH: 29.6 pg (ref 26.6–33.0)
MCHC: 31.2 g/dL — AB (ref 31.5–35.7)
MCV: 95 fL (ref 79–97)
Platelets: 205 10*3/uL (ref 150–379)
RBC: 4.16 x10E6/uL (ref 3.77–5.28)
RDW: 13.9 % (ref 12.3–15.4)
WBC: 5.8 10*3/uL (ref 3.4–10.8)

## 2017-10-29 LAB — PRO B NATRIURETIC PEPTIDE: NT-PRO BNP: 429 pg/mL — AB (ref 0–301)

## 2017-10-29 MED ORDER — FUROSEMIDE 20 MG PO TABS: 20.0000 mg | ORAL_TABLET | Freq: Every day | ORAL | 3 refills | Status: DC

## 2017-10-29 NOTE — Telephone Encounter (Signed)
Called and spoke with sister of patient - Sister stated she would have patient call when she wakes up

## 2017-10-29 NOTE — Telephone Encounter (Signed)
-----   Message from Chrystie Nose, MD sent at 10/29/2017  8:59 AM EST ----- Labs okay, normal renal function, no anemia- glucose remains poorly controlled. BNP is elevated - start lasix 20 mg po daily. Recheck BMET in 1 week - monitor for improvement in dyspnea.  DR. Rexene Edison

## 2017-10-29 NOTE — Telephone Encounter (Signed)
Daughter Verler made aware of results. She voiced understanding and will inform patient. Rx(s) sent to pharmacy electronically. BMET ordered - advised to come 11/29 or 11/30 for non-fasting labs, no appt needed.

## 2017-11-05 ENCOUNTER — Telehealth (HOSPITAL_COMMUNITY): Payer: Self-pay

## 2017-11-05 NOTE — Telephone Encounter (Signed)
Called and spoke with sister of patient - Sister stated that patient cannot afford the undergrad program but is interested in the Maintenance program. Will give referral to Cardiac Rehab Maintenance coordinator.

## 2017-11-12 DIAGNOSIS — Z79899 Other long term (current) drug therapy: Secondary | ICD-10-CM | POA: Diagnosis not present

## 2017-11-13 LAB — BASIC METABOLIC PANEL
BUN/Creatinine Ratio: 16 (ref 12–28)
BUN: 13 mg/dL (ref 8–27)
CALCIUM: 9.4 mg/dL (ref 8.7–10.3)
CHLORIDE: 102 mmol/L (ref 96–106)
CO2: 28 mmol/L (ref 20–29)
Creatinine, Ser: 0.82 mg/dL (ref 0.57–1.00)
GFR calc Af Amer: 82 mL/min/{1.73_m2} (ref >59)
GFR calc non Af Amer: 71 mL/min/{1.73_m2} (ref >59)
GLUCOSE: 202 mg/dL — AB (ref 65–99)
POTASSIUM: 4.9 mmol/L (ref 3.5–5.2)
Sodium: 141 mmol/L (ref 134–144)

## 2017-11-14 NOTE — Procedures (Signed)
Patient Name: Carrie Warner, Jaileen Study Date: 10/16/2017 Gender: Female D.O.B: 11/22/1946 Age (years): 70 Referring Provider: Lisette AbuKenneth C Hilty Height (inches): 62 Interpreting Physician: Nicki Guadalajarahomas Khyrin Trevathan MD, ABSM Weight (lbs): 182 RPSGT: Cherylann ParrDubili, Fred BMI: 33 MRN: [id number] Neck Size: 15.50  CLINICAL INFORMATION The patient is referred for a split-night protocol  Symptoms: daytime fatigue, snoring, daytime sleepiness.  Nonrestorative sleep.  Epworth Sleepiness Scale score: 21  SLEEP STUDY TECHNIQUE As per the AASM Manual for the Scoring of Sleep and Associated Events v2.3 (April 2016) with a hypopnea requiring 4% desaturations.  The channels recorded and monitored were frontal, central and occipital EEG, electrooculogram (EOG), submentalis EMG (chin), nasal and oral airflow, thoracic and abdominal wall motion, anterior tibialis EMG, snore microphone, electrocardiogram, and pulse oximetry. Bilevel positive airway pressure (BPAP) was initiated at the beginning of the study and titrated to treat sleep-disordered breathing.  MEDICATIONS *    aspirin 81 MG chewable tablet    Chew 1 tablet (81 mg total) by mouth daily.           *    atorvastatin (LIPITOR) 40 MG tablet    Take 1 tablet (40 mg total) by mouth daily at 6 PM.     *    Biotin 1000 MCG tablet    Take 1,000 mcg by mouth daily.          *    cholecalciferol (VITAMIN D) 1000 units tablet    Take 1,000 Units by mouth daily.           *    LANTUS SOLOSTAR 100 UNIT/ML Solostar Pen    Inject 35 Units into the skin 2 (two) times daily.           *    losartan (COZAAR) 50 MG tablet    Take 1 tablet (50 mg total) by mouth daily.    60 tablet    *    metFORMIN (GLUCOPHAGE-XR) 500 MG 24 hr tablet    Take 2 tablets (1,000 mg total) by mouth daily with breakfast.     *    metoprolol tartrate (LOPRESSOR) 25 MG tablet    Take 1 tablet (25 mg total) by mouth 2 (two) times daily.     *    nitroGLYCERIN (NITROSTAT) 0.4 MG SL tablet    Place 1  tablet (0.4 mg total) under the tongue every 5 (five) minutes as needed.     *    Rivaroxaban (XARELTO) 15 MG TABS tablet    Take 1 tablet (15 mg total) by mouth daily with supper.    90 tablet    0   Medications self-administered by patient taken the night of the study : N/A  RESPIRATORY PARAMETERS Optimal IPAP Pressure (cm): 14 AHI at Optimal Pressure (/hr) 11.1 Optimal EPAP Pressure (cm): 10   Overall Minimal O2 (%): 89.00 Minimal O2 at Optimal Pressure (%): 91.0 SLEEP ARCHITECTURE Start Time: 10:36:52 PM Stop Time: 4:34:16 AM Total Time (min): 357.4 Total Sleep Time (min): 281.1 Sleep Latency (min): 2.8 Sleep Efficiency (%): 78.7 REM Latency (min): 240.5 WASO (min): 73.5 Stage N1 (%): 7.11 Stage N2 (%): 81.86 Stage N3 (%): 0.00 Stage R (%): 11.03 Supine (%): 70.28 Arousal Index (/hr): 3.4      CARDIAC DATA The 2 lead EKG demonstrated sinus rhythm. The mean heart rate was 62.54 beats per minute. Other EKG findings include: None.  LEG MOVEMENT DATA The total Periodic Limb Movements of Sleep (PLMS) were 275. The PLMS  index was 58.69. A PLMS index of <15 is considered normal in adults.  IMPRESSIONS - Severe obstructive sleep apnea on his diagnostic study with an AHI of 68.7 per hour.  REM sleep was not achieved.  CPAP was implemented at 5 cm water pressure and was titrated to 13 cm pressure.  BiPAP was initiated at 14/10 and due to time constraints was not further titrated.  the AHI at 14/10 was 11.1/h - Moderate Central Sleep Apnea was noted during this titration (CAI = 16.4/h). - Oxygen desaturation to a nadir of 89%. 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 - No snoring was audible during this study. - No cardiac abnormalities were observed during this study. 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 Arousals associated with PLMs were rare.  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  A Small size Fisher[employer]Paykel Full Face Mask Simplus mask was used for the titration. - Efforts should be made to optimize nasal and oral pharyngeal patency. - Consider an oral guard for Bruxism. - Avoid alcohol, sedatives and other CNS depressants that may worsen sleep apnea and disrupt normal sleep architecture. - Sleep hygiene should be reviewed to assess factors that may improve sleep quality. - Weight management and regular exercise should be initiated or continued. - Recommend a download be obtained in 30 days and patient follow-up in sleep clinic for evaluation after 4 weeks of therapy.  [Electronically signed] 11/14/2017 03:09 PM  Nicki Guadalajara MD, [facility], ABSM Diplomate, American Board of Sleep Medicine   NPI: [id number]  [facility] PH: [phone]   FX: [phone] ACCREDITED BY THE AMERICAN ACADEMY OF SLEEP MEDICINE

## 2017-11-19 ENCOUNTER — Telehealth: Payer: Self-pay | Admitting: *Deleted

## 2017-11-19 NOTE — Telephone Encounter (Signed)
Spoke to daughter (ok per DPR)-aware of results and verbalized understanding.    Orders faxed to [facility] to initiate BiPAP set up.   Daughter aware and verbalized understanding.  Aware DME will contact them to get set up at home.

## 2017-11-19 NOTE — Telephone Encounter (Signed)
-----   Message from Lennette Bihari, MD sent at 11/16/2017  6:48 PM EST ----- Carrie Warner, please notify the patient the results of the BiPAP titration study, and schedule patient for BiPAP auto therapy with DME company with follow-up sleep clinic evaluation.

## 2017-11-19 NOTE — Progress Notes (Signed)
12/12-orders faxed to [facility]  Daughter aware (ok per DPR) and verbalized understanding.

## 2017-11-25 DIAGNOSIS — I639 Cerebral infarction, unspecified: Secondary | ICD-10-CM | POA: Diagnosis not present

## 2017-11-25 DIAGNOSIS — E1165 Type 2 diabetes mellitus with hyperglycemia: Secondary | ICD-10-CM | POA: Diagnosis not present

## 2017-11-25 DIAGNOSIS — I10 Essential (primary) hypertension: Secondary | ICD-10-CM | POA: Diagnosis not present

## 2017-11-25 DIAGNOSIS — I4891 Unspecified atrial fibrillation: Secondary | ICD-10-CM | POA: Diagnosis not present

## 2017-11-25 DIAGNOSIS — E785 Hyperlipidemia, unspecified: Secondary | ICD-10-CM | POA: Diagnosis not present

## 2017-11-26 DIAGNOSIS — R69 Illness, unspecified: Secondary | ICD-10-CM | POA: Diagnosis not present

## 2017-12-25 DIAGNOSIS — I69351 Hemiplegia and hemiparesis following cerebral infarction affecting right dominant side: Secondary | ICD-10-CM | POA: Diagnosis not present

## 2017-12-25 DIAGNOSIS — E11630 Type 2 diabetes mellitus with periodontal disease: Secondary | ICD-10-CM | POA: Diagnosis not present

## 2017-12-25 DIAGNOSIS — E669 Obesity, unspecified: Secondary | ICD-10-CM | POA: Diagnosis not present

## 2017-12-25 DIAGNOSIS — E785 Hyperlipidemia, unspecified: Secondary | ICD-10-CM | POA: Diagnosis not present

## 2017-12-25 DIAGNOSIS — R69 Illness, unspecified: Secondary | ICD-10-CM | POA: Diagnosis not present

## 2017-12-25 DIAGNOSIS — I10 Essential (primary) hypertension: Secondary | ICD-10-CM | POA: Diagnosis not present

## 2017-12-25 DIAGNOSIS — E1151 Type 2 diabetes mellitus with diabetic peripheral angiopathy without gangrene: Secondary | ICD-10-CM | POA: Diagnosis not present

## 2017-12-25 DIAGNOSIS — E1165 Type 2 diabetes mellitus with hyperglycemia: Secondary | ICD-10-CM | POA: Diagnosis not present

## 2017-12-25 DIAGNOSIS — I2510 Atherosclerotic heart disease of native coronary artery without angina pectoris: Secondary | ICD-10-CM | POA: Diagnosis not present

## 2017-12-25 DIAGNOSIS — Z794 Long term (current) use of insulin: Secondary | ICD-10-CM | POA: Diagnosis not present

## 2018-01-13 DIAGNOSIS — R69 Illness, unspecified: Secondary | ICD-10-CM | POA: Diagnosis not present

## 2018-03-19 ENCOUNTER — Ambulatory Visit: Payer: Medicare HMO | Admitting: Nurse Practitioner

## 2018-05-21 ENCOUNTER — Telehealth: Payer: Self-pay | Admitting: Internal Medicine

## 2018-05-21 ENCOUNTER — Encounter (HOSPITAL_COMMUNITY): Payer: Self-pay

## 2018-05-21 ENCOUNTER — Other Ambulatory Visit: Payer: Self-pay

## 2018-05-21 ENCOUNTER — Emergency Department (HOSPITAL_COMMUNITY): Payer: Medicare HMO

## 2018-05-21 ENCOUNTER — Emergency Department (HOSPITAL_COMMUNITY)
Admission: EM | Admit: 2018-05-21 | Discharge: 2018-05-22 | Disposition: A | Discharge status: Home / Self Care | Payer: Medicare HMO | Attending: Emergency Medicine | Admitting: Emergency Medicine

## 2018-05-21 DIAGNOSIS — R42 Dizziness and giddiness: Secondary | ICD-10-CM | POA: Diagnosis not present

## 2018-05-21 DIAGNOSIS — Z7902 Long term (current) use of antithrombotics/antiplatelets: Secondary | ICD-10-CM | POA: Insufficient documentation

## 2018-05-21 DIAGNOSIS — E119 Type 2 diabetes mellitus without complications: Secondary | ICD-10-CM | POA: Diagnosis not present

## 2018-05-21 DIAGNOSIS — I2510 Atherosclerotic heart disease of native coronary artery without angina pectoris: Secondary | ICD-10-CM | POA: Insufficient documentation

## 2018-05-21 DIAGNOSIS — Z794 Long term (current) use of insulin: Secondary | ICD-10-CM | POA: Insufficient documentation

## 2018-05-21 DIAGNOSIS — Z79899 Other long term (current) drug therapy: Secondary | ICD-10-CM | POA: Diagnosis not present

## 2018-05-21 DIAGNOSIS — I10 Essential (primary) hypertension: Secondary | ICD-10-CM | POA: Diagnosis not present

## 2018-05-21 DIAGNOSIS — Z7901 Long term (current) use of anticoagulants: Secondary | ICD-10-CM | POA: Diagnosis not present

## 2018-05-21 LAB — I-STAT CHEM 8, ED
BUN: 14 mg/dL (ref 6–20)
Calcium, Ion: 1.3 mmol/L (ref 1.15–1.40)
Chloride: 99 mmol/L — ABNORMAL LOW (ref 101–111)
Creatinine, Ser: 0.9 mg/dL (ref 0.44–1.00)
Glucose, Bld: 376 mg/dL — ABNORMAL HIGH (ref 65–99)
HCT: 42 % (ref 36.0–46.0)
Hemoglobin: 14.3 g/dL (ref 12.0–15.0)
Potassium: 4 mmol/L (ref 3.5–5.1)
Sodium: 140 mmol/L (ref 135–145)
TCO2: 29 mmol/L (ref 22–32)

## 2018-05-21 LAB — I-STAT TROPONIN, ED: TROPONIN I, POC: 0 ng/mL (ref 0.00–0.08)

## 2018-05-21 LAB — COMPREHENSIVE METABOLIC PANEL
ALT: 14 U/L (ref 14–54)
AST: 16 U/L (ref 15–41)
Albumin: 3.5 g/dL (ref 3.5–5.0)
Alkaline Phosphatase: 123 U/L (ref 38–126)
Anion gap: 11 (ref 5–15)
BUN: 12 mg/dL (ref 6–20)
CHLORIDE: 98 mmol/L — AB (ref 101–111)
CO2: 31 mmol/L (ref 22–32)
Calcium: 10.1 mg/dL (ref 8.9–10.3)
Creatinine, Ser: 0.98 mg/dL (ref 0.44–1.00)
GFR calc Af Amer: >60 mL/min (ref >60)
GFR, EST NON AFRICAN AMERICAN: 56 mL/min — AB (ref >60)
Glucose, Bld: 393 mg/dL — ABNORMAL HIGH (ref 65–99)
POTASSIUM: 4.2 mmol/L (ref 3.5–5.1)
Sodium: 140 mmol/L (ref 135–145)
Total Bilirubin: 0.5 mg/dL (ref 0.3–1.2)
Total Protein: 7.9 g/dL (ref 6.5–8.1)

## 2018-05-21 LAB — DIFFERENTIAL
ABS IMMATURE GRANULOCYTES: 0 10*3/uL (ref 0.0–0.1)
BASOS PCT: 0 %
Basophils Absolute: 0 10*3/uL (ref 0.0–0.1)
EOS ABS: 0.4 10*3/uL (ref 0.0–0.7)
Eosinophils Relative: 6 %
IMMATURE GRANULOCYTES: 0 %
Lymphocytes Relative: 41 %
Lymphs Abs: 2.8 10*3/uL (ref 0.7–4.0)
MONOS PCT: 8 %
Monocytes Absolute: 0.6 10*3/uL (ref 0.1–1.0)
NEUTROS PCT: 45 %
Neutro Abs: 3.1 10*3/uL (ref 1.7–7.7)

## 2018-05-21 LAB — URINALYSIS, ROUTINE W REFLEX MICROSCOPIC
Bacteria, UA: NONE SEEN
Bilirubin Urine: NEGATIVE
Glucose, UA: >=500 mg/dL — AB
Hgb urine dipstick: NEGATIVE
Ketones, ur: NEGATIVE mg/dL
Nitrite: NEGATIVE
Protein, ur: NEGATIVE mg/dL
Specific Gravity, Urine: 1.031 — ABNORMAL HIGH (ref 1.005–1.030)
pH: 5 (ref 5.0–8.0)

## 2018-05-21 LAB — CBC
HCT: 42.5 % (ref 36.0–46.0)
Hemoglobin: 13.1 g/dL (ref 12.0–15.0)
MCH: 29 pg (ref 26.0–34.0)
MCHC: 30.8 g/dL (ref 30.0–36.0)
MCV: 94.2 fL (ref 78.0–100.0)
PLATELETS: 201 10*3/uL (ref 150–400)
RBC: 4.51 MIL/uL (ref 3.87–5.11)
RDW: 12.6 % (ref 11.5–15.5)
WBC: 6.9 10*3/uL (ref 4.0–10.5)

## 2018-05-21 LAB — ETHANOL: Alcohol, Ethyl (B): <10 mg/dL (ref <10)

## 2018-05-21 LAB — RAPID URINE DRUG SCREEN, HOSP PERFORMED
Amphetamines: NOT DETECTED
Benzodiazepines: NOT DETECTED
Cocaine: NOT DETECTED
Opiates: NOT DETECTED
Tetrahydrocannabinol: NOT DETECTED

## 2018-05-21 LAB — CBG MONITORING, ED: Glucose-Capillary: 367 mg/dL — ABNORMAL HIGH (ref 65–99)

## 2018-05-21 LAB — PROTIME-INR
INR: 1.22
Prothrombin Time: 15.3 seconds — ABNORMAL HIGH (ref 11.4–15.2)

## 2018-05-21 LAB — APTT: APTT: 32 s (ref 24–36)

## 2018-05-21 NOTE — Progress Notes (Signed)
[facility]  PATIENT: Carrie Warner DOB: [birth year]   REASON FOR FOLLOWUP  follow-up for sroke 07/19/17 HISTORY FROM:patient and daughter Carrie Warner    HISTORY OF PRESENT ILLNESS:FROM RECORDBertha Blufordis an 72 y.o.femalewith a past medical history of hypertension, hyperlipidemia and DM2, who presented to [facility] with weakness, AMS, left facial droop and drooling.  She arrived to the ED following a several hour of unresponsiveness that began at a church in [place] and ended after she received IV fluids.  The patient's children noticed that she has had progressive trouble walking over the past three months with more acute worsening over the past 3-4 days.  On arrival, [name] was hypotensive and difficult to rouse with aleft facial droop and drooling. Per report, She had positive orthostatic vital signs despite judicious fluid resuscitation. She has no recollection of the events that brought her here.  On initial exam by the neurohospitalist, she was sitting in bed, and her facial droop and drooling had resolved. She was alert and oriented, able to name objects. Denies dizziness, light headedness, headache and any pain. She was eager to eat her lunch. Her daughter noted that, since being admitted, she occasionally gets light headed and asks to lay down. She also states that her mother has been confused since getting here (mistaking events from today as those from last week).  At baseline, [name] walks independently. She looks after her own affairs, bathes, cooks and cleans for herself.  MRI of the brain on 07/19/17 showed small areas of acute infarct in the right posterior limb internal capsule and right anterior thalamus, and neurology was called consulted for additional evaluation.     Date last known well: Unable to determine Time last known well: Unable to determine Modified Rankin Score: 1 Patient was not administered IV t-PA secondary to arriving outside of the  treatment window. Interval history 10/04/2018CM [name], 72 year old female returns for hospital follow-up for stroke. MRI small areas of acute infarct in the posterior limb Internal capsule on the right and in the right anterior thalamus. CTA head and neck extensive intracranial atherosclerosis/stenosis greatest in the left paraclinoid ICA segment where it is moderate to severe. 2-D echo EF 60-65%, no source of embolus. LDL 126. Hemoglobin A1c 8.5 she was started on DAPT aspirin and Plavix for 3 months, then Plavix alone. She has not had recurrent stroke or TIA symptoms She has minimal bruising and no bleeding.. She remains on Lipitor without myalgias. Blood sugar this morning 159. Blood pressure in the office today 140/70 Sleep study has been ordered for her daytime sleepiness and fatigue by Dr. Rennis Golden. She is currently going to a diabetic educator. She is due to have lower extremity arterial imaging in October due to the pain in her legs when walking.She returns for evaluation UPDATE 6/14/2019CM [name], 72 year old female returns for follow-up with history of stroke in August 2018 she is currently on Plavix for secondary stroke prevention and lipid.  She has not had recurrent stroke or TIA symptoms.  She does not have myalgias she was seen in the emergency room yesterday for dizziness.  MRI brain  no acute findings. Blood sugar was 367.  She is due to see endocrinologist for better control of the her diabetes.  She was started on Glucophage yesterday however the prescription has not been picked up.  Blood pressure in the office today 146/82.  Sleep study was ordered by Dr. Rennis Golden and patient has obstructive sleep apnea but according to the daughter  unable to afford co payment monthly. She is currently getting home health physical therapy occupational therapy and nursing services.  She  Returns for reevaluation.   REVIEW OF SYSTEMS: Full 14 system review of systems performed and notable only for those  listed, all others are neg:  Constitutional: neg  Cardiovascular: neg Ear/Nose/Throat: neg  Skin: neg Eyes: neg Respiratory: shortness of breath Gastroitestinal: neg  Hematology/Lymphatic: neg  Endocrine: neg Musculoskeletal: Gait abnormality Allergy/Immunology: neg Neurological: weakness Psychiatric: neg Sleep : Obstructive sleep apnea not using CPAP   ALLERGIES: No Known Allergies  HOME MEDICATIONS: Outpatient Medications Prior to Visit  Medication Sig Dispense Refill  . atorvastatin (LIPITOR) 40 MG tablet Take 1 tablet (40 mg total) by mouth daily at 6 PM. 30 tablet 6  . Biotin 1000 MCG tablet Take 1,000 mcg by mouth daily.    . cholecalciferol (VITAMIN D) 1000 units tablet Take 1,000 Units by mouth daily.    . clopidogrel (PLAVIX) 75 MG tablet Take 1 tablet (75 mg total) by mouth daily. 90 tablet 3  . furosemide (LASIX) 20 MG tablet Take 1 tablet (20 mg total) by mouth daily. 90 tablet 3  . LANTUS SOLOSTAR 100 UNIT/ML Solostar Pen Inject 35 Units into the skin 2 (two) times daily.    Marland Kitchen losartan (COZAAR) 50 MG tablet Take 1 tablet (50 mg total) by mouth daily. 60 tablet 6  . metFORMIN (GLUCOPHAGE-XR) 500 MG 24 hr tablet Take 2 tablets (1,000 mg total) by mouth daily with breakfast. 60 tablet 0  . metoprolol tartrate (LOPRESSOR) 25 MG tablet Take 1 tablet (25 mg total) by mouth 2 (two) times daily. 60 tablet 1  . nitroGLYCERIN (NITROSTAT) 0.4 MG SL tablet Place 1 tablet (0.4 mg total) under the tongue every 5 (five) minutes as needed. 25 tablet 2  . Rivaroxaban (XARELTO) 15 MG TABS tablet Take 1 tablet (15 mg total) by mouth daily with supper. 90 tablet 0   No facility-administered medications prior to visit.     PAST MEDICAL HISTORY: Past Medical History:  Diagnosis Date  . Coronary artery disease involving native coronary artery of native heart with angina pectoris (HCC) 09/26/2017   Cath for Abnormal Myoivew - Anterior Ischemia: Mid LAD-2 lesion, 90 %stenosed. After  atherectomyand PTCA, there was no residual stenosis. Post intervention, there is a 0% residual stenosis. Mid LAD-1 lesion, 70 %stenosed. THis was treated with atherectomy, PTCA and stent placement. A STENT SIERRA 3.00 X 18 MM drug eluting stent was successfully placed. Post intervention, there is a 0% residual ste  . Myocardial infarction involving left anterior descending (LAD) coronary artery (HCC) 09/26/2017   Type 4a MI - post PCI  . PAF (paroxysmal atrial fibrillation) (HCC) 09/25/2017   Noted during LAD PCI  . Presence of drug coated stent in LAD coronary artery: STENT SIERRA 3.00 X 18 MM drug eluting stent 09/26/2017   Mid LAD-2 lesion, 90 %stenosed & Mid LAD-1 lesion, 70 %stenosed. Lesions treated with atherectomy, PTCA and stent placement (in worst segment). A STENT SIERRA 3.00 X 18 MM drug eluting stent was successfully placed.   . Stroke (HCC) 07/2017    PAST SURGICAL HISTORY: Past Surgical History:  Procedure Laterality Date  . CORONARY ATHERECTOMY N/A 09/26/2017   Procedure: CORONARY ATHERECTOMY;  Surgeon: Corky Crafts, MD;  Location: [facility] INVASIVE CV LAB;  Service: Cardiovascular;  Laterality: N/A;  . CORONARY STENT INTERVENTION N/A 09/26/2017   Procedure: CORONARY STENT INTERVENTION;  Surgeon: Corky Crafts, MD;  Location: [facility] INVASIVE  CV LAB;  Service: Cardiovascular;  Laterality: N/A;  . LEFT HEART CATH AND CORONARY ANGIOGRAPHY N/A 09/26/2017   Procedure: LEFT HEART CATH AND CORONARY ANGIOGRAPHY;  Surgeon: Corky Crafts, MD;  Location: [facility] INVASIVE CV LAB;  Service: Cardiovascular;  Laterality: N/A;    FAMILY HISTORY: Family History  Problem Relation Age of Onset  . Alzheimer's disease Mother   . Heart disease Father   . Alcohol abuse Sister   . Stroke Brother     SOCIAL HISTORY: Social History   Socioeconomic History  . Marital status: Single    Spouse name: Not on file  . Number of children: Not on file  . Years of education: Not on file  .  Highest education level: Not on file  Occupational History  . Not on file  Social Needs  . Financial resource strain: Not on file  . Food insecurity:    Worry: Not on file    Inability: Not on file  . Transportation needs:    Medical: Not on file    Non-medical: Not on file  Tobacco Use  . Smoking status: Never Smoker  . Smokeless tobacco: Never Used  Substance and Sexual Activity  . Alcohol use: No  . Drug use: No  . Sexual activity: Not Currently  Lifestyle  . Physical activity:    Days per week: Not on file    Minutes per session: Not on file  . Stress: Not on file  Relationships  . Social connections:    Talks on phone: Not on file    Gets together: Not on file    Attends religious service: Not on file    Active member of club or organization: Not on file    Attends meetings of clubs or organizations: Not on file    Relationship status: Not on file  . Intimate partner violence:    Fear of current or ex partner: Not on file    Emotionally abused: Not on file    Physically abused: Not on file    Forced sexual activity: Not on file  Other Topics Concern  . 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  Does not work.  Has 8 children (3 of which have passed away).  Drinks 2 cups coffee/tea daily.      PHYSICAL EXAM  Vitals:   09/11/17 1017  BP: (!) 140/70  Pulse: 79  Weight: 188 lb 3.2 oz (85.4 kg)  Height: 5\' 2"  (1.575 m)   There is no height or weight on file to calculate BMI.  Generalized: Well developed, obese female in no acute distress  Head: normocephalic and atraumatic,. Oropharynx benign  Neck: Supple, no carotid bruits  Cardiac: Regular rate rhythm, no murmur  Musculoskeletal: No deformity   Neurological examination   Mentation: Alert oriented to time, place, history taking. Attention span and concentration appropriate. Recent and remote memory intact.  Follows all commands speech and language fluent.    Cranial nerve II-XII: Pupils were equal round reactive to light extraocular movements were full, visual field were full on confrontational test. Facial sensation and strength were normal. hearing was intact to finger rubbing bilaterally. Uvula tongue midline. head turning and shoulder shrug were normal and symmetric.Tongue protrusion into cheek strength was normal. Motor: normal bulk and tone, full strength in the BUE, BLE, Sensory: normal and symmetric to light touch, Coordination: finger-nose-finger, heel-to-shin bilaterally, no dysmetria,no tremor Reflexes: + upper lower and symmetric,  plantar  responses were flexor bilaterally. Gait and Station: In wheelchair not ambulated , uses a walker at home   DIAGNOSTIC DATA (LABS, IMAGING, TESTING) - I reviewed patient records, labs, notes, testing and imaging myself where available.  Lab Results  Component Value Date   WBC 5.8 10/28/2017   HGB 12.3 10/28/2017   HCT 39.4 10/28/2017   MCV 95 10/28/2017   PLT 205 10/28/2017      Component Value Date/Time   NA 141 11/12/2017 1441   K 4.9 11/12/2017 1441   CL 102 11/12/2017 1441   CO2 28 11/12/2017 1441   GLUCOSE 202 (H) 11/12/2017 1441   GLUCOSE 301 (H) 09/27/2017 0336   BUN 13 11/12/2017 1441   CREATININE 0.82 11/12/2017 1441   CALCIUM 9.4 11/12/2017 1441   PROT 7.6 10/28/2017 1401   ALBUMIN 3.8 10/28/2017 1401   AST 16 10/28/2017 1401   ALT 13 10/28/2017 1401   ALKPHOS 130 (H) 10/28/2017 1401   BILITOT 0.3 10/28/2017 1401   GFRNONAA 71 11/12/2017 1441   GFRAA 82 11/12/2017 1441   Lab Results  Component Value Date   CHOL 182 07/21/2017   HDL 32 (L) 07/21/2017   LDLCALC 126 (H) 07/21/2017   TRIG 119 07/21/2017   CHOLHDL 5.7 07/21/2017   Lab Results  Component Value Date   HGBA1C 8.5 (H) 07/19/2017    Lab Results  Component Value Date   TSH 1.389 07/19/2017      ASSESSMENT AND PLAN  71 y.o. year old female  has a past medical history of Diabetes mellitus without  complication (HCC); High cholesterol; Hypertension; and Stroke (HCC). Here for  follow-up for stroke 07/2017 . MRI small areas of acute infarct in the posterior limb Internal capsule on the right and in the right anterior thalamus. CTA head and neck extensive intracranial atherosclerosis/stenosis greatest in the left paraclinoid ICA segment where it is moderate to severe. 2-D echo EF 60-65%, no source of embolus. LDL 126. Hemoglobin A1c 8.5.  Seen in the emergency room yesterday for hypoglycemia blood sugar 367.  MRI of the brain without acute abnormality The patient is a current patient of Dr. Roda Shutters  who is out of the office today . This note is sent to the work in doctor.      PLAN Stressed the importance of management of risk factors to prevent further stroke Continue Plavix for secondary stroke prevention and intracranial stenosis Maintain strict control of hypertension with blood pressure goal below 130/90, today's reading 146/82 continue antihypertensive medications Control of diabetes with hemoglobin A1c below 6.5 followed by primary care most recent random CBG was 367 on 05/21/18 continue diabetic medications,has referral to endocrinologist Cholesterol with LDL cholesterol less than 70, followed by PCP  continue statin drug  Lipitor Continue PT and OT  healthy diet with whole grains,  fresh fruits and vegetables F/U in  6 months if continued stable from neuro status will discharge Nilda Riggs, [facility], [facility], APRN  [facility] [address], [address] [place], [place] [address] [phone]

## 2018-05-21 NOTE — Telephone Encounter (Signed)
Spoke with Pt's daughter who reports the Pt is experiencing blurred vision, headache, and episodes of slurred speech and off balance. She states Pt had a stroke before and has some residual right sided weakness but it has increased. She reports pt BP 160/80 today and yesterday systolic was 180. Daughter instructed to take pt to the ED for further evaluation. Daughter verbalized understanding.

## 2018-05-21 NOTE — ED Triage Notes (Signed)
Patient arrived by [facility] for 1 month of intermittent dizziness and blood sugar running 500. Primary MD adjusted insulin today and blood sugar already down to 416. Alert and oriented, denies pain, no neuro deficits, fell 1 week ago due to positional dizziness, mechanical fall

## 2018-05-21 NOTE — ED Notes (Signed)
Patient transported to MRI 

## 2018-05-21 NOTE — ED Notes (Signed)
EDP at bedside  

## 2018-05-21 NOTE — ED Provider Notes (Signed)
Patient placed in Quick Look pathway, seen and evaluated   Chief Complaint: Dizziness, hyperglycemia  HPI:   Patient brought in for evaluationat the request of her PCP. She states her blood sugars have been running high in the 500s. She also notes dizziness on a daily basis for the past month. She tells me that she feels dizzy when she gets up in the morning and when she makes sudden head movements. This resolves with rest. Patient's daughter disagrees and states that she has been more persistently dizzy at random intervals and lightheaded. No syncope but she did sustain a mechanical fall last Monday  In the bathroom where she did sustain head injury. She denies loss of consciousness. Patient's daughter also notes that she has an unsteady gait and has required assistance whereas she did not before. She did have a stroke last year with no residual deficits per the patient although daughter states that she has some left-sided weakness.she states PCP sent her for stroke rule out because "her symptoms are similar to the last time she had a stroke ".  ROS: positive for gait abnormality, dizziness, lightheadedness, hyperglycemia negative for numbness, weakness, fevers, syncope, chest pain, or shortness of breath  Physical Exam:   Gen: No distress  Neuro: Awake and Alert  Skin: Warm    Focused Exam: fluent speech with no evidence of dysarthria aphasia, no facial droop, sensation intact to soft touch of extremities. Cranial nerves II through XII tested and intact. No battle signs, raccoon eyes, or rhinorrhea. No hemotympanum. No tenderness to palpation of the face or skull. And ambulates with a wide-based gait but exhibits generally good balance. She is able to walk on her heels and toes without difficulty. Pronator drift absent. Romberg sign is present.   Initiation of care has begun. The patient has been counseled on the process, plan, and necessity for staying for the completion/evaluation, and the  remainder of the medical screening examination    Bennye Alm 05/21/18 1348    Arby Barrette, MD 05/22/18 1724

## 2018-05-21 NOTE — ED Provider Notes (Signed)
[facility] EMERGENCY DEPARTMENT Provider Note   CSN: [id number] Arrival date & time: 05/21/18  1306     History   Chief Complaint No chief complaint on file.   HPI Carrie Warner is a 72 y.o. female.  72 year old female presents with one-month history of dizziness that is worse with movement or standing.  Is also been experiencing high blood sugars in the 500s but states that she has been not been compliant with a diabetic diet and has been eating things like [place] fries and drinking regular ginger ale.  Her dizziness occurs every day and is not associated with headache or mental status changes.  Does have history of CVA but has had no residual deficits.  Denies any new medications being started.  Had a fall last Monday but did not pass out.  Called her doctor and told her to come here due to concern for possible CVA.     Past Medical History:  Diagnosis Date  . Coronary artery disease involving native coronary artery of native heart with angina pectoris (HCC) 09/26/2017   Cath for Abnormal Myoivew - Anterior Ischemia: Mid LAD-2 lesion, 90 %stenosed. After atherectomyand PTCA, there was no residual stenosis. Post intervention, there is a 0% residual stenosis. Mid LAD-1 lesion, 70 %stenosed. THis was treated with atherectomy, PTCA and stent placement. A STENT SIERRA 3.00 X 18 MM drug eluting stent was successfully placed. Post intervention, there is a 0% residual ste  . Diabetes mellitus without complication (HCC)   . Myocardial infarction involving left anterior descending (LAD) coronary artery (HCC) 09/26/2017   Type 4a MI - post PCI  . PAF (paroxysmal atrial fibrillation) (HCC) 09/25/2017   Noted during LAD PCI  . Presence of drug coated stent in LAD coronary artery: STENT SIERRA 3.00 X 18 MM drug eluting stent 09/26/2017   Mid LAD-2 lesion, 90 %stenosed & Mid LAD-1 lesion, 70 %stenosed. Lesions treated with atherectomy, PTCA and stent placement (in worst  segment). A STENT SIERRA 3.00 X 18 MM drug eluting stent was successfully placed.   . Stroke [facility]) 07/2017    Patient Active Problem List   Diagnosis Date Noted  . Coronary artery disease due to lipid rich plaque 10/28/2017  . Myocardial infarction involving left anterior descending (LAD) coronary artery (HCC) - TYPE 4a periprocedural MI 09/29/2017  . DOE (dyspnea on exertion) 09/26/2017  . Abnormal nuclear stress test 09/26/2017  . New onset atrial fibrillation (HCC) 09/26/2017  . Coronary artery disease involving native coronary artery of native heart with angina pectoris (HCC) 09/26/2017  . Presence of drug coated stent in LAD coronary artery: STENT SIERRA 3.00 X 18 MM drug eluting stent 09/26/2017  . Gait abnormality 09/11/2017  . Excessive daytime sleepiness 09/01/2017  . Cold foot 09/01/2017  . Witnessed apneic spells 09/01/2017  . Shortness of breath 09/01/2017  . Mixed hyperlipidemia   . CVA (cerebral vascular accident) (HCC) 07/20/2017  . Orthostatic hypotension 07/19/2017  . Essential hypertension 07/19/2017  . Type 2 diabetes mellitus with complication (HCC) 07/19/2017    Past Surgical History:  Procedure Laterality Date  . CORONARY ATHERECTOMY N/A 09/26/2017   Procedure: CORONARY ATHERECTOMY;  Surgeon: Corky Crafts, MD;  Location: [facility] INVASIVE CV LAB;  Service: Cardiovascular;  Laterality: N/A;  . CORONARY STENT INTERVENTION N/A 09/26/2017   Procedure: CORONARY STENT INTERVENTION;  Surgeon: Corky Crafts, MD;  Location: [facility] INVASIVE CV LAB;  Service: Cardiovascular;  Laterality: N/A;  . LEFT HEART CATH AND CORONARY ANGIOGRAPHY N/A 09/26/2017  Procedure: LEFT HEART CATH AND CORONARY ANGIOGRAPHY;  Surgeon: Corky Crafts, MD;  Location: [facility] INVASIVE CV LAB;  Service: Cardiovascular;  Laterality: N/A;     OB History   None      Home Medications    Prior to Admission medications   Medication Sig Start Date End Date Taking? Authorizing Provider    atorvastatin (LIPITOR) 40 MG tablet Take 1 tablet (40 mg total) by mouth daily at 6 PM. 09/22/17   Strader, Lennart Pall, PA-C  Biotin 1000 MCG tablet Take 1,000 mcg by mouth daily.    [provider]  cholecalciferol (VITAMIN D) 1000 units tablet Take 1,000 Units by mouth daily.    [provider]  clopidogrel (PLAVIX) 75 MG tablet Take 1 tablet (75 mg total) by mouth daily. 10/28/17   Hilty, Lisette Abu, MD  furosemide (LASIX) 20 MG tablet Take 1 tablet (20 mg total) by mouth daily. 10/29/17 01/27/18  Hilty, Lisette Abu, MD  LANTUS SOLOSTAR 100 UNIT/ML Solostar Pen Inject 35 Units into the skin 2 (two) times daily. 09/13/17   [provider]  losartan (COZAAR) 50 MG tablet Take 1 tablet (50 mg total) by mouth daily. 09/22/17   Strader, Lennart Pall, PA-C  metFORMIN (GLUCOPHAGE-XR) 500 MG 24 hr tablet Take 2 tablets (1,000 mg total) by mouth daily with breakfast. 07/22/17   Randel Pigg, Dorma Russell, MD  metoprolol tartrate (LOPRESSOR) 25 MG tablet Take 1 tablet (25 mg total) by mouth 2 (two) times daily. 09/30/17   Arty Baumgartner, NP  nitroGLYCERIN (NITROSTAT) 0.4 MG SL tablet Place 1 tablet (0.4 mg total) under the tongue every 5 (five) minutes as needed. 09/30/17   Arty Baumgartner, NP  Rivaroxaban (XARELTO) 15 MG TABS tablet Take 1 tablet (15 mg total) by mouth daily with supper. 09/30/17   Arty Baumgartner, NP    Family History Family History  Problem Relation Age of Onset  . Alzheimer's disease Mother   . Heart disease Father   . Alcohol abuse Sister   . Stroke Brother     Social History Social History   Tobacco Use  . Smoking status: Never Smoker  . Smokeless tobacco: Never Used  Substance Use Topics  . Alcohol use: No  . Drug use: No     Allergies   Patient has no known allergies.   Review of Systems Review of Systems  All other systems reviewed and are negative.    Physical Exam Updated Vital Signs BP 113/66 (BP Location: Left Arm)   Pulse  82   Temp 97.9 F (36.6 C) (Oral)   Resp 16   SpO2 99%   Physical Exam  Constitutional: She is oriented to person, place, and time. She appears well-developed and well-nourished.  Non-toxic appearance. No distress.  HENT:  Head: Normocephalic and atraumatic.  Eyes: Pupils are equal, round, and reactive to light. Conjunctivae, EOM and lids are normal.  Neck: Normal range of motion. Neck supple. No tracheal deviation present. No thyroid mass present.  Cardiovascular: Normal rate, regular rhythm and normal heart sounds. Exam reveals no gallop.  No murmur heard. Pulmonary/Chest: Effort normal and breath sounds normal. No stridor. No respiratory distress. She has no decreased breath sounds. She has no wheezes. She has no rhonchi. She has no rales.  Abdominal: Soft. Normal appearance and bowel sounds are normal. She exhibits no distension. There is no tenderness. There is no rebound and no CVA tenderness.  Musculoskeletal: Normal range of motion. She exhibits no edema  or tenderness.  Neurological: She is alert and oriented to person, place, and time. She has normal strength. No cranial nerve deficit or sensory deficit. GCS eye subscore is 4. GCS verbal subscore is 5. GCS motor subscore is 6.  Skin: Skin is warm and dry. No abrasion and no rash noted.  Psychiatric: She has a normal mood and affect. Her speech is normal and behavior is normal.  Nursing note and vitals reviewed.    ED Treatments / Results  Labs (all labs ordered are listed, but only abnormal results are displayed) Labs Reviewed  PROTIME-INR - Abnormal; Notable for the following components:      Result Value   Prothrombin Time 15.3 (*)    All other components within normal limits  COMPREHENSIVE METABOLIC PANEL - Abnormal; Notable for the following components:   Chloride 98 (*)    Glucose, Bld 393 (*)    GFR calc non Af Amer 56 (*)    All other components within normal limits  URINALYSIS, ROUTINE W REFLEX MICROSCOPIC -  Abnormal; Notable for the following components:   APPearance HAZY (*)    Specific Gravity, Urine 1.031 (*)    Glucose, UA >=500 (*)    Leukocytes, UA SMALL (*)    All other components within normal limits  I-STAT CHEM 8, ED - Abnormal; Notable for the following components:   Chloride 99 (*)    Glucose, Bld 376 (*)    All other components within normal limits  CBG MONITORING, ED - Abnormal; Notable for the following components:   Glucose-Capillary 367 (*)    All other components within normal limits  APTT  CBC  DIFFERENTIAL  ETHANOL  RAPID URINE DRUG SCREEN, HOSP PERFORMED  I-STAT TROPONIN, ED    EKG None  Radiology Ct Head Wo Contrast  Result Date: 05/21/2018 CLINICAL DATA:  Dizziness and hyperglycemia EXAM: CT HEAD WITHOUT CONTRAST TECHNIQUE: Contiguous axial images were obtained from the base of the skull through the vertex without intravenous contrast. COMPARISON:  07/31/2017 FINDINGS: Brain: Geographic area decreased attenuation is noted in the left cerebellar hemisphere consistent with prior lacunar infarct. Similar findings are noted within the thalami bilaterally. Mild chronic white matter ischemic change is seen. No acute hemorrhage, acute infarction or space-occupying mass lesion is noted. Vascular: No hyperdense vessel or unexpected calcification. Skull: Normal. Negative for fracture or focal lesion. Sinuses/Orbits: No acute finding. Other: None. 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 No acute abnormality noted. Electronically Signed   By: Alcide Clever M.D.   On: 05/21/2018 16:42    Procedures Procedures (including critical care time)  Medications Ordered in ED Medications - No data to display   Initial Impression / Assessment and Plan / ED Course  I have reviewed the triage vital signs and the nursing notes.  Pertinent labs & imaging results that were available during my care of the patient were reviewed by me and considered in my medical  decision making (see chart for details).     ED ECG REPORT   Date: 05/21/2018  Rate: 76  Rhythm: normal sinus rhythm  QRS Axis: normal  Intervals: normal  ST/T Wave abnormalities: nonspecific ST changes  Conduction Disutrbances:none  Narrative Interpretation:   Old EKG Reviewed: none available  I have personally reviewed the EKG tracing and agree with the computerized printout as noted. Sugar mildly elevated and patient instructed to follow a strict diabetic diet.  Urinalysis without infection.  Patient's MRI results without acute abnormalities.  Neurological exam is  at baseline and she is stable for discharge  Final Clinical Impressions(s) / ED Diagnoses   Final diagnoses:  None    ED Discharge Orders    None       Lorre Nick, MD 05/21/18 2347

## 2018-05-21 NOTE — Telephone Encounter (Signed)
New message    Pt c/o BP issue: STAT if pt c/o blurred vision, one-sided weakness or slurred speech  1. What are your last 5 BP readings? 160/80  2. Are you having any other symptoms (ex. Dizziness, headache, blurred vision, passed out)? headaches  3. What is your BP issue? Pt daughter is calling about mother's bp.

## 2018-05-21 NOTE — ED Notes (Signed)
Family requesting update, family notified provider will review results and apologized for delay, Freida Busman, MD notified of family request and that results are available. Will continue to monitor.

## 2018-05-22 ENCOUNTER — Ambulatory Visit: Payer: Medicare HMO | Admitting: Nurse Practitioner

## 2018-05-22 ENCOUNTER — Encounter: Payer: Self-pay | Admitting: Nurse Practitioner

## 2018-05-22 VITALS — BP 146/82 | HR 77 | Wt 187.0 lb

## 2018-05-22 DIAGNOSIS — E782 Mixed hyperlipidemia: Secondary | ICD-10-CM | POA: Diagnosis not present

## 2018-05-22 DIAGNOSIS — R269 Unspecified abnormalities of gait and mobility: Secondary | ICD-10-CM | POA: Diagnosis not present

## 2018-05-22 DIAGNOSIS — I10 Essential (primary) hypertension: Secondary | ICD-10-CM | POA: Diagnosis not present

## 2018-05-22 DIAGNOSIS — I63311 Cerebral infarction due to thrombosis of right middle cerebral artery: Secondary | ICD-10-CM

## 2018-05-22 NOTE — Patient Instructions (Signed)
Stressed the importance of management of risk factors to prevent further stroke Continue Plavix for secondary stroke prevention and intracranial stenosis Maintain strict control of hypertension with blood pressure goal below 130/90, today's reading 146/82 continue antihypertensive medications Control of diabetes with hemoglobin A1c below 6.5 followed by primary care most recent random CBG was 367 on 05/21/18 continue diabetic medications,has referral to endocrinologist Cholesterol with LDL cholesterol less than 70, followed by PCP  continue statin drug  Lipitor Continue PT and OT  healthy diet with whole grains,  fresh fruits and vegetables F/U in 6 months

## 2018-06-01 ENCOUNTER — Emergency Department (HOSPITAL_COMMUNITY)
Admission: EM | Admit: 2018-06-01 | Discharge: 2018-06-04 | Disposition: A | Discharge status: Home / Self Care | Payer: Medicare HMO | Attending: Emergency Medicine | Admitting: Emergency Medicine

## 2018-06-01 ENCOUNTER — Other Ambulatory Visit: Payer: Self-pay

## 2018-06-01 ENCOUNTER — Emergency Department (HOSPITAL_COMMUNITY): Payer: Medicare HMO

## 2018-06-01 ENCOUNTER — Encounter (HOSPITAL_COMMUNITY): Payer: Self-pay | Admitting: Emergency Medicine

## 2018-06-01 DIAGNOSIS — I4891 Unspecified atrial fibrillation: Secondary | ICD-10-CM | POA: Insufficient documentation

## 2018-06-01 DIAGNOSIS — I10 Essential (primary) hypertension: Secondary | ICD-10-CM | POA: Diagnosis not present

## 2018-06-01 DIAGNOSIS — Z79899 Other long term (current) drug therapy: Secondary | ICD-10-CM | POA: Insufficient documentation

## 2018-06-01 DIAGNOSIS — R109 Unspecified abdominal pain: Secondary | ICD-10-CM | POA: Insufficient documentation

## 2018-06-01 DIAGNOSIS — S0990XA Unspecified injury of head, initial encounter: Secondary | ICD-10-CM | POA: Diagnosis present

## 2018-06-01 DIAGNOSIS — Z8673 Personal history of transient ischemic attack (TIA), and cerebral infarction without residual deficits: Secondary | ICD-10-CM | POA: Insufficient documentation

## 2018-06-01 DIAGNOSIS — R2689 Other abnormalities of gait and mobility: Secondary | ICD-10-CM | POA: Diagnosis not present

## 2018-06-01 DIAGNOSIS — Y93E1 Activity, personal bathing and showering: Secondary | ICD-10-CM | POA: Diagnosis not present

## 2018-06-01 DIAGNOSIS — W19XXXA Unspecified fall, initial encounter: Secondary | ICD-10-CM

## 2018-06-01 DIAGNOSIS — Z794 Long term (current) use of insulin: Secondary | ICD-10-CM | POA: Diagnosis not present

## 2018-06-01 DIAGNOSIS — I252 Old myocardial infarction: Secondary | ICD-10-CM | POA: Insufficient documentation

## 2018-06-01 DIAGNOSIS — E119 Type 2 diabetes mellitus without complications: Secondary | ICD-10-CM | POA: Diagnosis not present

## 2018-06-01 DIAGNOSIS — Z7982 Long term (current) use of aspirin: Secondary | ICD-10-CM | POA: Diagnosis not present

## 2018-06-01 DIAGNOSIS — I2510 Atherosclerotic heart disease of native coronary artery without angina pectoris: Secondary | ICD-10-CM | POA: Diagnosis not present

## 2018-06-01 DIAGNOSIS — Y999 Unspecified external cause status: Secondary | ICD-10-CM | POA: Diagnosis not present

## 2018-06-01 DIAGNOSIS — R296 Repeated falls: Secondary | ICD-10-CM | POA: Insufficient documentation

## 2018-06-01 DIAGNOSIS — Z7901 Long term (current) use of anticoagulants: Secondary | ICD-10-CM | POA: Insufficient documentation

## 2018-06-01 DIAGNOSIS — Y92031 Bathroom in apartment as the place of occurrence of the external cause: Secondary | ICD-10-CM | POA: Insufficient documentation

## 2018-06-01 DIAGNOSIS — W010XXA Fall on same level from slipping, tripping and stumbling without subsequent striking against object, initial encounter: Secondary | ICD-10-CM | POA: Insufficient documentation

## 2018-06-01 LAB — URINALYSIS, ROUTINE W REFLEX MICROSCOPIC
Bilirubin Urine: NEGATIVE
Glucose, UA: NEGATIVE mg/dL
Hgb urine dipstick: NEGATIVE
KETONES UR: NEGATIVE mg/dL
LEUKOCYTES UA: NEGATIVE
Nitrite: NEGATIVE
PROTEIN: NEGATIVE mg/dL
Specific Gravity, Urine: 1.005 (ref 1.005–1.030)
pH: 6 (ref 5.0–8.0)

## 2018-06-01 LAB — COMPREHENSIVE METABOLIC PANEL
ALT: 17 U/L (ref 14–54)
AST: 20 U/L (ref 15–41)
Albumin: 3.3 g/dL — ABNORMAL LOW (ref 3.5–5.0)
Alkaline Phosphatase: 111 U/L (ref 38–126)
Anion gap: 8 (ref 5–15)
BUN: 7 mg/dL (ref 6–20)
CO2: 30 mmol/L (ref 22–32)
CREATININE: 0.89 mg/dL (ref 0.44–1.00)
Calcium: 9.4 mg/dL (ref 8.9–10.3)
Chloride: 106 mmol/L (ref 101–111)
GFR calc non Af Amer: >60 mL/min (ref >60)
Glucose, Bld: 89 mg/dL (ref 65–99)
Potassium: 4 mmol/L (ref 3.5–5.1)
Sodium: 144 mmol/L (ref 135–145)
Total Bilirubin: 0.6 mg/dL (ref 0.3–1.2)
Total Protein: 7.6 g/dL (ref 6.5–8.1)

## 2018-06-01 LAB — CBC WITH DIFFERENTIAL/PLATELET
ABS IMMATURE GRANULOCYTES: 0 10*3/uL (ref 0.0–0.1)
BASOS ABS: 0 10*3/uL (ref 0.0–0.1)
Basophils Relative: 1 %
Eosinophils Absolute: 0.3 10*3/uL (ref 0.0–0.7)
Eosinophils Relative: 5 %
HCT: 41.2 % (ref 36.0–46.0)
Hemoglobin: 12.4 g/dL (ref 12.0–15.0)
IMMATURE GRANULOCYTES: 0 %
Lymphocytes Relative: 44 %
Lymphs Abs: 2.4 10*3/uL (ref 0.7–4.0)
MCH: 29 pg (ref 26.0–34.0)
MCHC: 30.1 g/dL (ref 30.0–36.0)
MCV: 96.5 fL (ref 78.0–100.0)
MONO ABS: 0.5 10*3/uL (ref 0.1–1.0)
Monocytes Relative: 10 %
NEUTROS ABS: 2.2 10*3/uL (ref 1.7–7.7)
NEUTROS PCT: 40 %
Platelets: 216 10*3/uL (ref 150–400)
RBC: 4.27 MIL/uL (ref 3.87–5.11)
RDW: 13 % (ref 11.5–15.5)
WBC: 5.5 10*3/uL (ref 4.0–10.5)

## 2018-06-01 LAB — APTT: APTT: 31 s (ref 24–36)

## 2018-06-01 LAB — TROPONIN I: Troponin I: <0.03 ng/mL (ref <0.03)

## 2018-06-01 LAB — CBG MONITORING, ED: GLUCOSE-CAPILLARY: 88 mg/dL (ref 65–99)

## 2018-06-01 LAB — PROTIME-INR
INR: 1.23
Prothrombin Time: 15.4 seconds — ABNORMAL HIGH (ref 11.4–15.2)

## 2018-06-01 MED ORDER — IOHEXOL 300 MG/ML  SOLN
100.0000 mL | Freq: Once | INTRAMUSCULAR | Status: AC | PRN
  Administered 2018-06-01: 100 mL via INTRAVENOUS

## 2018-06-01 MED ORDER — TRAMADOL HCL 50 MG PO TABS: 50.0000 mg | ORAL_TABLET | Freq: Four times a day (QID) | ORAL | 0 refills | Status: AC | PRN

## 2018-06-01 MED ORDER — TRAMADOL HCL 50 MG PO TABS
50.0000 mg | ORAL_TABLET | Freq: Once | ORAL | Status: AC
  Administered 2018-06-01: 50 mg via ORAL
  Filled 2018-06-01: qty 1

## 2018-06-01 NOTE — ED Notes (Signed)
ED Provider at bedside. 

## 2018-06-01 NOTE — ED Notes (Signed)
Patient transported to X-ray 

## 2018-06-01 NOTE — ED Provider Notes (Signed)
[facility] EMERGENCY DEPARTMENT Provider Note   CSN: [id number] Arrival date & time: 06/01/18  4132     History   Chief Complaint Chief Complaint  Patient presents with  . Fall  . blood thinners    HPI Carrie Warner is a 72 y.o. female.  HPI Patient presents by EMS after falling in the bathtub this morning.  She is unsure why she fell.  She believes that she struck her head and is amnestic to the event.  She also complains of right thoracic rib pain which she thinks is from the fall.  She is on Xarelto for atrial fibrillation.  She denies any visual changes, speech changes, focal weakness or numbness.  Denies chest pain or shortness of breath. Past Medical History:  Diagnosis Date  . Coronary artery disease involving native coronary artery of native heart with angina pectoris (HCC) 09/26/2017   Cath for Abnormal Myoivew - Anterior Ischemia: Mid LAD-2 lesion, 90 %stenosed. After atherectomyand PTCA, there was no residual stenosis. Post intervention, there is a 0% residual stenosis. Mid LAD-1 lesion, 70 %stenosed. THis was treated with atherectomy, PTCA and stent placement. A STENT SIERRA 3.00 X 18 MM drug eluting stent was successfully placed. Post intervention, there is a 0% residual ste  . Diabetes mellitus without complication (HCC)   . Myocardial infarction involving left anterior descending (LAD) coronary artery (HCC) 09/26/2017   Type 4a MI - post PCI  . PAF (paroxysmal atrial fibrillation) (HCC) 09/25/2017   Noted during LAD PCI  . Presence of drug coated stent in LAD coronary artery: STENT SIERRA 3.00 X 18 MM drug eluting stent 09/26/2017   Mid LAD-2 lesion, 90 %stenosed & Mid LAD-1 lesion, 70 %stenosed. Lesions treated with atherectomy, PTCA and stent placement (in worst segment). A STENT SIERRA 3.00 X 18 MM drug eluting stent was successfully placed.   . Stroke [facility]) 07/2017    Patient Active Problem List   Diagnosis Date Noted  . Coronary artery  disease due to lipid rich plaque 10/28/2017  . Myocardial infarction involving left anterior descending (LAD) coronary artery (HCC) - TYPE 4a periprocedural MI 09/29/2017  . DOE (dyspnea on exertion) 09/26/2017  . Abnormal nuclear stress test 09/26/2017  . New onset atrial fibrillation (HCC) 09/26/2017  . Coronary artery disease involving native coronary artery of native heart with angina pectoris (HCC) 09/26/2017  . Presence of drug coated stent in LAD coronary artery: STENT SIERRA 3.00 X 18 MM drug eluting stent 09/26/2017  . Gait abnormality 09/11/2017  . Excessive daytime sleepiness 09/01/2017  . Cold foot 09/01/2017  . Witnessed apneic spells 09/01/2017  . Shortness of breath 09/01/2017  . Mixed hyperlipidemia   . CVA (cerebral vascular accident) (HCC) 07/20/2017  . Orthostatic hypotension 07/19/2017  . Essential hypertension 07/19/2017  . Type 2 diabetes mellitus with complication (HCC) 07/19/2017    Past Surgical History:  Procedure Laterality Date  . CORONARY ATHERECTOMY N/A 09/26/2017   Procedure: CORONARY ATHERECTOMY;  Surgeon: Corky Crafts, MD;  Location: [facility] INVASIVE CV LAB;  Service: Cardiovascular;  Laterality: N/A;  . CORONARY STENT INTERVENTION N/A 09/26/2017   Procedure: CORONARY STENT INTERVENTION;  Surgeon: Corky Crafts, MD;  Location: [facility] INVASIVE CV LAB;  Service: Cardiovascular;  Laterality: N/A;  . LEFT HEART CATH AND CORONARY ANGIOGRAPHY N/A 09/26/2017   Procedure: LEFT HEART CATH AND CORONARY ANGIOGRAPHY;  Surgeon: Corky Crafts, MD;  Location: [facility] INVASIVE CV LAB;  Service: Cardiovascular;  Laterality: N/A;     OB  History   None      Home Medications    Prior to Admission medications   Medication Sig Start Date End Date Taking? Authorizing Provider  aspirin EC 81 MG tablet Take 81 mg by mouth daily.   Yes [provider]  atorvastatin (LIPITOR) 40 MG tablet Take 1 tablet (40 mg total) by mouth daily at 6 PM. 09/22/17  Yes  Strader, Lennart Pall, PA-C  Biotin 1000 MCG tablet Take 1,000 mcg by mouth daily.   Yes [provider]  cholecalciferol (VITAMIN D) 1000 units tablet Take 1,000 Units by mouth daily.   Yes [provider]  clopidogrel (PLAVIX) 75 MG tablet Take 1 tablet (75 mg total) by mouth daily. 10/28/17  Yes Hilty, Lisette Abu, MD  furosemide (LASIX) 20 MG tablet Take 1 tablet (20 mg total) by mouth daily. 10/29/17 06/01/18 Yes Hilty, Lisette Abu, MD  LANTUS SOLOSTAR 100 UNIT/ML Solostar Pen Inject 45 Units into the skin 2 (two) times daily.  09/13/17  Yes [provider]  losartan (COZAAR) 50 MG tablet Take 1 tablet (50 mg total) by mouth daily. 09/22/17  Yes Strader, [place] M, PA-C  metFORMIN (GLUCOPHAGE) 1000 MG tablet Take 1,000 mg by mouth 2 (two) times daily. 05/21/18  Yes [provider]  metoprolol tartrate (LOPRESSOR) 25 MG tablet Take 1 tablet (25 mg total) by mouth 2 (two) times daily. 09/30/17  Yes Arty Baumgartner, NP  nitroGLYCERIN (NITROSTAT) 0.4 MG SL tablet Place 1 tablet (0.4 mg total) under the tongue every 5 (five) minutes as needed. 09/30/17  Yes Arty Baumgartner, NP  Rivaroxaban (XARELTO) 15 MG TABS tablet Take 1 tablet (15 mg total) by mouth daily with supper. 09/30/17  Yes Arty Baumgartner, NP  traMADol (ULTRAM) 50 MG tablet Take 1 tablet (50 mg total) by mouth every 6 (six) hours as needed for severe pain. 06/01/18   Loren Racer, MD    Family History Family History  Problem Relation Age of Onset  . Alzheimer's disease Mother   . Heart disease Father   . Alcohol abuse Sister   . Stroke Brother     Social History Social History   Tobacco Use  . Smoking status: Never Smoker  . Smokeless tobacco: Never Used  Substance Use Topics  . Alcohol use: No  . Drug use: No     Allergies   Patient has no known allergies.   Review of Systems Review of Systems  Constitutional: Negative for chills and fever.  HENT: Negative for trouble  swallowing.   Eyes: Negative for visual disturbance.  Respiratory: Negative for cough and shortness of breath.   Cardiovascular: Negative for chest pain and palpitations.  Gastrointestinal: Negative for abdominal pain, diarrhea, nausea and vomiting.  Genitourinary: Negative for dysuria, flank pain and frequency.  Musculoskeletal: Positive for back pain and myalgias. Negative for neck pain.  Skin: Negative for rash and wound.  Neurological: Positive for syncope. Negative for dizziness, speech difficulty, weakness, light-headedness, numbness and headaches.  All other systems reviewed and are negative.    Physical Exam Updated Vital Signs BP (!) 153/95 (BP Location: Left Arm)   Pulse 74   Temp 99 F (37.2 C) (Oral)   Resp 16   Ht 5\' 2"  (1.575 m)   Wt 85.7 kg (189 lb)   SpO2 92%   BMI 34.57 kg/m   Physical Exam  Constitutional: She is oriented to person, place, and time. She appears well-developed and well-nourished. No distress.  HENT:  Head: Normocephalic and atraumatic.  Mouth/Throat: Oropharynx is clear and moist.  Patient has some mild posterior scalp tenderness to palpation without obvious hematoma or laceration.  Midface is stable.  No intraoral trauma.  Eyes: Pupils are equal, round, and reactive to light. EOM are normal.  Neck: Normal range of motion. Neck supple.  No posterior midline cervical tenderness to palpation.  Cardiovascular: Normal rate and regular rhythm. Exam reveals no gallop and no friction rub.  No murmur heard. Pulmonary/Chest: Effort normal and breath sounds normal. No stridor. No respiratory distress. She has no wheezes. She has no rales. She exhibits no tenderness.  Abdominal: Soft. Bowel sounds are normal. There is no tenderness. There is no rebound and no guarding.  Musculoskeletal: Normal range of motion. She exhibits no edema or tenderness.  Pelvis is stable.  No midline thoracic or lumbar tenderness.  Patient does have some right sided thoracic  rib tenderness to palpation.  Distal pulses intact.  Neurological: She is alert and oriented to person, place, and time.  5/5 motor in all extremities.  Sensation intact.  Skin: Skin is warm and dry. No rash noted. She is not diaphoretic. No erythema.  Psychiatric: She has a normal mood and affect. Her behavior is normal.  Nursing note and vitals reviewed.    ED Treatments / Results  Labs (all labs ordered are listed, but only abnormal results are displayed) Labs Reviewed  COMPREHENSIVE METABOLIC PANEL - Abnormal; Notable for the following components:      Result Value   Albumin 3.3 (*)    All other components within normal limits  PROTIME-INR - Abnormal; Notable for the following components:   Prothrombin Time 15.4 (*)    All other components within normal limits  URINALYSIS, ROUTINE W REFLEX MICROSCOPIC - Abnormal; Notable for the following components:   Color, Urine STRAW (*)    All other components within normal limits  CBC WITH DIFFERENTIAL/PLATELET  APTT  TROPONIN I  CBG MONITORING, ED    EKG EKG Interpretation  Date/Time:  Monday June 01 2018 09:24:26 EDT Ventricular Rate:  65 PR Interval:    QRS Duration: 92 QT Interval:  414 QTC Calculation: 431 R Axis:   -16 Text Interpretation:  Junctional rhythm Borderline left axis deviation Nonspecific T abnormalities, diffuse leads Baseline wander in lead(s) V5 Confirmed by Loren Racer ([id number]) on 06/01/2018 10:40:00 AM   Radiology Dg Ribs Unilateral W/chest Right  Result Date: 06/01/2018 CLINICAL DATA:  Fall today with pain in right lateral ribs. Initial encounter. EXAM: RIGHT RIBS AND CHEST - 3+ VIEW COMPARISON:  08/01/2017 FINDINGS: Artifact from EKG leads. Low volume chest with interstitial crowding. There is no edema, consolidation, effusion, or pneumothorax. Normal heart size and mediastinal contours. No evidence of rib fracture. Bulky generalized thoracic spondylosis. IMPRESSION: Negative for rib fracture or  visible intrathoracic injury. Electronically Signed   By: Marnee Spring M.D.   On: 06/01/2018 10:31   Ct Head Wo Contrast  Result Date: 06/01/2018 CLINICAL DATA:  Recent head trauma with anticoagulation EXAM: CT HEAD WITHOUT CONTRAST TECHNIQUE: Contiguous axial images were obtained from the base of the skull through the vertex without intravenous contrast. COMPARISON:  05/21/2018 FINDINGS: Brain: No evidence of acute infarction, hemorrhage, hydrocephalus, extra-axial collection or mass lesion/mass effect. Prior lacunar infarcts are noted in the left cerebellum and thalami bilaterally stable from the prior exam. Vascular: No hyperdense vessel or unexpected calcification. Skull: Normal. Negative for fracture or focal lesion. Sinuses/Orbits: No acute finding. Other: None. IMPRESSION: Chronic ischemic  changes without acute abnormality. Electronically Signed   By: Alcide Clever M.D.   On: 06/01/2018 12:25   Ct Abdomen Pelvis W Contrast  Result Date: 06/01/2018 CLINICAL DATA:  Acute right-sided abdominal pain after fall yesterday. EXAM: CT ABDOMEN AND PELVIS WITH CONTRAST TECHNIQUE: Multidetector CT imaging of the abdomen and pelvis was performed using the standard protocol following bolus administration of intravenous contrast. CONTRAST:  OMNIPAQUE IOHEXOL 300 MG/ML  SOLN COMPARISON:  None. FINDINGS: Lower chest: No acute abnormality. Hepatobiliary: No focal liver abnormality is seen. Status post cholecystectomy. No biliary dilatation. Pancreas: Unremarkable. No pancreatic ductal dilatation or surrounding inflammatory changes. Spleen: Normal in size without focal abnormality. Adrenals/Urinary Tract: Adrenal glands are unremarkable. Kidneys are normal, without renal calculi, focal lesion, or hydronephrosis. Bladder is unremarkable. Stomach/Bowel: The stomach appears normal. There is no evidence of bowel obstruction or inflammation. The appendix is not visualized. Vascular/Lymphatic: Aortic atherosclerosis.  No enlarged abdominal or pelvic lymph nodes. Reproductive: Status post hysterectomy. No adnexal masses. Other: No abdominal wall hernia or abnormality. No abdominopelvic ascites. Musculoskeletal: No acute or significant osseous findings. IMPRESSION: No acute abnormality seen in the abdomen or pelvis. Aortic Atherosclerosis (ICD10-I70.0). Electronically Signed   By: Lupita Raider, M.D.   On: 06/01/2018 14:18    Procedures Procedures (including critical care time)  Medications Ordered in ED Medications  traMADol (ULTRAM) tablet 50 mg (50 mg Oral Given 06/01/18 1336)  iohexol (OMNIPAQUE) 300 MG/ML solution 100 mL (100 mLs Intravenous Contrast Given 06/01/18 1354)     Initial Impression / Assessment and Plan / ED Course  I have reviewed the triage vital signs and the nursing notes.  Pertinent labs & imaging results that were available during my care of the patient were reviewed by me and considered in my medical decision making (see chart for details).    CT head and abdomen pelvis without evidence of trauma.  Laboratory work-up is reassuring.  Attempted to ambulate patient and was not able due to pain.  Given p.o. Ultram and patient states her pain has significantly improved.  Will attempt to ambulate again.  If patient is unable to will need social work consult.   Final Clinical Impressions(s) / ED Diagnoses   Final diagnoses:  Fall, initial encounter  Acute right flank pain    ED Discharge Orders        Ordered    traMADol (ULTRAM) 50 MG tablet  Every 6 hours PRN     06/01/18 1545       Loren Racer, MD 06/02/18 [phone]

## 2018-06-01 NOTE — Progress Notes (Signed)
CSW received consult concerning home health or SNF. Physical therapy consult placed. Page sent to PT multiple times to see pt. Awaiting Physical Therapy evaluation for disposition.   Montine Circle, [facility]  [phone]

## 2018-06-01 NOTE — ED Notes (Signed)
Attempted to ambulate pt. Pt complaining of excruciating pain. Unable to sit up on side of bed without assistance. Encouraged pt to attempt to ambulate, pt required almost complete assistance to stand up and placed all her body weight on this tech. Pts family stood to other side to pt to help her ambulate. Pt crying and lost control of bladder. Pt was only able to to take 5 steps. Pt sat on chair while this tech cleaned up floor, bed and then pt. Bed brought closer to pt. Pt now in clean bed. MD and RN notified.   Daughter stated she has fallen 6 times in the last six months. Daughter stated that at home pt needs to go up 16 steps. Daughter stated that she doesn't think that she will be able to do that on her own let get her to the bathroom.  10/10 pain .

## 2018-06-01 NOTE — ED Triage Notes (Signed)
[facility]- pt with fall this morning hitting her head on the bathtub. She was syncopal time of episode unknown. She is taking blood thinners. No bruises or deformity noted. Pt is being assessed for Alzheimers per family. EDP at bedside.

## 2018-06-01 NOTE — Clinical Social Work Note (Signed)
Clinical Social Work Assessment  Patient Details  Name: Carrie Warner MRN: [id number] Date of Birth: January 02, 1946  Date of referral:  06/01/18               Reason for consult:  Facility Placement                Permission sought to share information with:  Case Manager, Customer service manager, Family Supports Permission granted to share information::  Yes, Verbal Permission Granted  Name::     Carrie Warner [phone]  Agency::     Relationship::     Contact Information:     Housing/Transportation Living arrangements for the past 2 months:  Apartment Source of Information:  Adult Children Patient Interpreter Needed:  None Criminal Activity/Legal Involvement Pertinent to Current Situation/Hospitalization:    Significant Relationships:  Adult Children Lives with:  Adult Children Do you feel safe going back to the place where you live?  No Need for family participation in patient care:  Yes (Comment)  Care giving concerns:  CSW consulted for SNF placement. CSW met with pt and pt's daughter, Carrie Warner, at bedside.   Social Worker assessment / plan:  CSW met with pt and pt's daughter, Carrie Warner at bedside. CSW explained process for SNF placement. Pt awaiting physical therapy consult for evaluation. Pt's daughter is the main contact. Pt is not a safe discharge home per daughter due to the number of steps in the home. Pt unable to walk even with nursing support.   CSW provided pt's daughter with list of SNF in the area. CSW explained that placement is based on insurance and bed availability. Pt's daughter expressed understanding the process and will review the list of SNF.   Employment status:  Retired Nurse, adult PT Recommendations:  Not assessed at this time Information / Referral to community resources:  [place]  Patient/Family's Response to care:  Pt and pt's family agreeable to plan of care.   Patient/Family's Understanding of and Emotional  Response to Diagnosis, Current Treatment, and Prognosis:  Pt and pt's daughter did not have any questions for CSW at this time.   Emotional Assessment Appearance:    Attitude/Demeanor/Rapport:    Affect (typically observed):  Quiet, Calm, Withdrawn Orientation:    Alcohol / Substance use:    Psych involvement (Current and /or in the community):  No (Comment)  Discharge Needs  Concerns to be addressed:  Home Safety Concerns, Discharge Planning Concerns Readmission within the last 30 days:  No Current discharge risk:  None Barriers to Discharge:  Continued Medical Work up, Other, Insurance Authorization(PT Evalutation/ SNF Placement)   Wendelyn Breslow, LCSW 06/01/2018, 6:58 PM

## 2018-06-01 NOTE — ED Notes (Signed)
Family/Friends visiting patient at this time-Monique,RN

## 2018-06-01 NOTE — ED Notes (Signed)
Attempted to ambulate pt with 2nd RN, using walker.  Pt was able to take approx 4 steps before pt almost fell, c/o R flank pain, and was caught by rn's.  MD notified.

## 2018-06-02 ENCOUNTER — Emergency Department (HOSPITAL_COMMUNITY): Payer: Medicare HMO

## 2018-06-02 LAB — CBG MONITORING, ED
GLUCOSE-CAPILLARY: 228 mg/dL — AB (ref 70–99)
GLUCOSE-CAPILLARY: 234 mg/dL — AB (ref 70–99)
Glucose-Capillary: 240 mg/dL — ABNORMAL HIGH (ref 70–99)

## 2018-06-02 MED ORDER — INSULIN GLARGINE 100 UNIT/ML SOLOSTAR PEN: 45.0000 [IU] | PEN_INJECTOR | Freq: Two times a day (BID) | SUBCUTANEOUS | Status: DC

## 2018-06-02 MED ORDER — CLOPIDOGREL BISULFATE 75 MG PO TABS
75.0000 mg | ORAL_TABLET | Freq: Every day | ORAL | Status: DC
  Administered 2018-06-02 – 2018-06-03 (×2): 75 mg via ORAL
  Filled 2018-06-02 (×2): qty 1

## 2018-06-02 MED ORDER — METOPROLOL TARTRATE 25 MG PO TABS
25.0000 mg | ORAL_TABLET | Freq: Two times a day (BID) | ORAL | Status: DC
  Administered 2018-06-02 – 2018-06-03 (×4): 25 mg via ORAL
  Filled 2018-06-02 (×4): qty 1

## 2018-06-02 MED ORDER — METFORMIN HCL 500 MG PO TABS
1000.0000 mg | ORAL_TABLET | Freq: Two times a day (BID) | ORAL | Status: DC
  Administered 2018-06-02 – 2018-06-03 (×4): 1000 mg via ORAL
  Filled 2018-06-02 (×4): qty 2

## 2018-06-02 MED ORDER — TRAMADOL HCL 50 MG PO TABS
50.0000 mg | ORAL_TABLET | Freq: Four times a day (QID) | ORAL | Status: DC | PRN
  Administered 2018-06-02 – 2018-06-03 (×3): 50 mg via ORAL
  Filled 2018-06-02 (×3): qty 1

## 2018-06-02 MED ORDER — ASPIRIN EC 81 MG PO TBEC
81.0000 mg | DELAYED_RELEASE_TABLET | Freq: Every day | ORAL | Status: DC
  Administered 2018-06-02 – 2018-06-03 (×2): 81 mg via ORAL
  Filled 2018-06-02 (×2): qty 1

## 2018-06-02 MED ORDER — BIOTIN 1000 MCG PO TABS: 1000.0000 ug | ORAL_TABLET | Freq: Every day | ORAL | Status: DC

## 2018-06-02 MED ORDER — RIVAROXABAN 15 MG PO TABS
15.0000 mg | ORAL_TABLET | Freq: Every day | ORAL | Status: DC
  Administered 2018-06-02 – 2018-06-03 (×2): 15 mg via ORAL
  Filled 2018-06-02 (×3): qty 1

## 2018-06-02 MED ORDER — VITAMIN D 1000 UNITS PO TABS
1000.0000 [IU] | ORAL_TABLET | Freq: Every day | ORAL | Status: DC
  Administered 2018-06-02 – 2018-06-03 (×2): 1000 [IU] via ORAL
  Filled 2018-06-02 (×2): qty 1

## 2018-06-02 MED ORDER — INSULIN GLARGINE 100 UNIT/ML ~~LOC~~ SOLN
45.0000 [IU] | Freq: Two times a day (BID) | SUBCUTANEOUS | Status: DC
  Administered 2018-06-02 – 2018-06-03 (×3): 45 [IU] via SUBCUTANEOUS
  Filled 2018-06-02 (×5): qty 0.45

## 2018-06-02 MED ORDER — LOSARTAN POTASSIUM 50 MG PO TABS
50.0000 mg | ORAL_TABLET | Freq: Every day | ORAL | Status: DC
  Administered 2018-06-02 – 2018-06-03 (×2): 50 mg via ORAL
  Filled 2018-06-02 (×2): qty 1

## 2018-06-02 MED ORDER — ATORVASTATIN CALCIUM 40 MG PO TABS
40.0000 mg | ORAL_TABLET | Freq: Every day | ORAL | Status: DC
  Administered 2018-06-02 – 2018-06-03 (×2): 40 mg via ORAL
  Filled 2018-06-02 (×2): qty 1

## 2018-06-02 NOTE — Progress Notes (Signed)
  Chaplain assisted patient and daughter, with family friend present, in creating and completing an AD, with the help of a notary.  Copy provided to medical records via RN; original and copy given to [facility], daughter. Provided refreshments to visitors and with RN permission, a diet Ginger Ale, peanut butter and crackers.    Please call as needed for further support.   Belia Heman, [place] [phone]   06/02/18 1258  Clinical Encounter Type  Visited With Patient and family together  Visit Type Initial;Other (Comment) (AD)  Referral From Nurse  Consult/Referral To Chaplain  Spiritual Encounters  Spiritual Needs Emotional (AD)  Stress Factors  Patient Stress Factors Health changes  Family Stress Factors Family relationships  Advance Directives (For Healthcare)  Does Patient Have a Medical Advance Directive? No  Does patient want to make changes to medical advance directive? Yes (Inpatient - patient requests chaplain consult to change a medical advance directive)  Copy of Healthcare Power of Attorney in Chart? Yes  Would patient like information on creating a medical advance directive? Yes (Inpatient - patient requests chaplain consult to create a medical advance directive)  Mental Health Advance Directives  Does Patient Have a Mental Health Advance Directive? No

## 2018-06-02 NOTE — Progress Notes (Addendum)
CSW spoke with pt and daughter at bedside. CSW informed daughter that CSW has spoken with Wellsite geologist and Agricultural consultant and was informed that [facility] would not approve pt for stay in a rehab facility as pt is custodial care. CSW advised pt and daught that if seeking long term placement then pt will need to apply for long term Medicaid or pay out of pocket (not able to do). Daughter expressed verbal understanding of this and is okay with pt being discharged home with home health services. CSW expressed that CSW would have RNCM speak with pt and family for home health options. CSW has also provided pt's daughter with updated SNF list for future placement needs. Daughter expressed the desire to complete Healthcare POA paperwork. Chaplain services paged at this time per RN.   Claude Manges Glenora Morocho, MSW, LCSW-A Emergency Department Clinical Social Worker [phone]

## 2018-06-02 NOTE — ED Notes (Signed)
OT at bedside. 

## 2018-06-02 NOTE — ED Notes (Signed)
OT state pt voided on rising. Linen and gown changed

## 2018-06-02 NOTE — ED Notes (Signed)
Pt eating meal before CBG. MD aware and will take at bedtime.

## 2018-06-02 NOTE — ED Notes (Signed)
Pt sitting at side of bed and eating breakfast

## 2018-06-02 NOTE — Care Management (Signed)
DME orders faxed to [facility] for hospital and w/c. Discussed with Patient and daughter who are agreeable to resume [facility] services with [facility]. Patient will be transported home via [facility], awaiting delivery bed and w/c delivery. Updated RN on Pod F and Dr. Juleen China.

## 2018-06-02 NOTE — ED Notes (Signed)
Pt returns from xray and c/o increasing pain.

## 2018-06-02 NOTE — Care Management (Signed)
ED daytime CM contacted Dr. Jacky Kindle Medical Advisor regarding patient, and determination noted patient is custodial care. This CM reviewed patient's record patient presented s/p recurrent falls, ED evaluation noted no acute findings . Patient was unable to ambulate with 2 assist with nursing staff  PT evaluation done today and  recommends SNF, CM spoke with patient and family concerning custodial vs skilled needs, and explained that insurance will not cover custodial care, patient and daughter verbalized understanding, but expresses the need for DME hospital bed and w/c to assist with meeting patient's care needs, ADL's.  CM explained that I will discuss with EDP, and obtain orders, offered choice selected [facility].   Patient and daughter have now expressed to nurse on Pod F complaints of shoulder pain. Patient's daughter is concerned about getting hospital bed before discharge because patient is unable to ambulate and bedroom is on the 2nd floor. Plans to set up hospital bed in bedroom on 1st floor. Updated Dr. Juleen China EDP.

## 2018-06-02 NOTE — Discharge Planning (Signed)
[facility] consulted regarding home health services for pt.  Situation discussed during Length of Stay meeting with advisory team and EDSW.  Advisory team recommends home with [facility] vs private pay at ALF.  EDCM will talk with family to set up home health services when they return to ED.

## 2018-06-02 NOTE — ED Notes (Signed)
Pt cleaned of urine and linen changed. Pt positioned gfor comfort and blocked on left side. purewick replaced.

## 2018-06-02 NOTE — Progress Notes (Addendum)
CSW received handoff from 2nd Shift ED CSW. CSW was informed that pt and pt's daughter are looking for placement for pt. CSW spoke with pt's daughter Allegra Grana to gather more information this. CSW was informed that pt had a stroke last year and since March pt has been having trouble with walking and getting around the home which has resulted in pt being somewhat bound to the home. [name] expressed that pt can do things but will get unsteady on feet and need help. CSW sought clarity on if family is looking for long term placement (custodial care) for pt and [name] expressed "we are but the lease here isnt up until October". CSW explained to [facility] the barrier to placing pt with insurance as [employer] does not cover long term care. Mekio expressed "well we will go with the short term care first then". CSW exaplined that per Assistant Director Cassandria Anger will not pay for short term care either if documents suggest that pt needs long term care and is custodial.   CSW advised [name] that if family is interested in long term care they would need to apply for long term medicaid and began the process that way. Daughter expressed understanding of this however CSW not sure if daughter is agreeable to applying for Medicaid. CSW to discuss case with Cabin crew for final needs in getting care for pt as needed and speak with family at beside.   Claude Manges Schuyler Olden, MSW, LCSW-A Emergency Department Clinical Social Worker [phone]

## 2018-06-02 NOTE — ED Notes (Signed)
PT at bedsdie

## 2018-06-02 NOTE — ED Provider Notes (Signed)
    Durable Medical Equipment  (From admission, onward)        Start     Ordered   06/02/18 0000  DME Hospital bed    Question:  Bed type  Answer:  Semi-electric   06/02/18 1958   06/02/18 0000  For home use only DME standard manual wheelchair with seat cushion    Comments:  Patient suffers from gait impairment which impairs their ability to perform daily activities like dressing in the home.  A cane will not resolve  issue with performing activities of daily living. A wheelchair will allow patient to safely perform daily activities. Patient can safely propel the wheelchair in the home or has a caregiver who can provide assistance.  Accessories: elevating leg rests (ELRs), wheel locks, extensions and anti-tippers.   06/02/18 2001         Raeford Razor, MD 06/02/18 2002

## 2018-06-02 NOTE — ED Notes (Signed)
Chaplins office contacted with request for assistance with POA. colsult for same placed.

## 2018-06-02 NOTE — ED Notes (Signed)
Chaplin at bedsdie

## 2018-06-02 NOTE — ED Notes (Signed)
Patient transported to X-ray 

## 2018-06-02 NOTE — Discharge Planning (Signed)
EDCM in to speak with pt and family regarding home health services.  Pt daughter pointed out that certain SNFs DO accept [facility] and she would like to explore that option more because her mom is not safe at home when daughter is not present.  [facility] will consult with EDSW for available SNF options.    EDCM set up [facility] services with [facility] should pt go home with home health.

## 2018-06-02 NOTE — Evaluation (Signed)
Occupational Therapy Evaluation Patient Details Name: Carrie Warner MRN: [id number] DOB: Sep 20, 1946 Today's Date: 06/02/2018    History of Present Illness Pt is a 72 y/o female presenting to ED following fall. Pt also presenting with R shoulder pain and R rib pain. Imaging negative for R rib fractures. CT of head and abdomen negative for acute abnormality. PMH includes CAD s/p stent placement, DM, MI, CVA, and a fib.    Clinical Impression   PT admitted with R side pain with pending workup. Pt currently with functional limitiations due to the deficits listed below (see OT problem list). Pt currently requires two person (A) from family to sit<.stand and unable to transfer. Pt will require hospital bed / wc / 3n1 if required to d/c home with ambulance transport.  Pt will benefit from skilled OT to increase their independence and safety with adls and balance to allow discharge SNF.     Follow Up Recommendations  SNF    Equipment Recommendations  3 in 1 bedside commode;Wheelchair (measurements OT);Wheelchair cushion (measurements OT);Hospital bed    Recommendations for Other Services       Precautions / Restrictions Precautions Precautions: Fall Precaution Comments: Reports multiple falls within the past 6 weeks, however, did not recall the fall prior to admission.  Restrictions Weight Bearing Restrictions: No      Mobility Bed Mobility Overal bed mobility: Needs Assistance Bed Mobility: Rolling;Supine to Sit;Sit to Supine Rolling: Mod assist   Supine to sit: Mod assist Sit to supine: +2 for physical assistance;Mod assist Sit to sidelying: Mod assist General bed mobility comments: pt requires (A) of bed rail and physical (A) to elevated head off bed and trunk to upright position. pt with incr SOB with transition and incr RR. pt requires (A) with BIL LE and trunk to descend to bed surface  Transfers Overall transfer level: Needs assistance Equipment used: 1 person hand held  assist Transfers: Sit to/from Stand Sit to Stand: Mod assist              Balance Overall balance assessment: Needs assistance Sitting-balance support: No upper extremity supported;Feet supported Sitting balance-Leahy Scale: Fair     Standing balance support: Bilateral upper extremity supported Standing balance-Leahy Scale: Poor Standing balance comment: Reliant on BUE support and external support to stand                           ADL either performed or assessed with clinical judgement   ADL Overall ADL's : Needs assistance/impaired Eating/Feeding: Independent   Grooming: Minimal assistance   Upper Body Bathing: Moderate assistance   Lower Body Bathing: Maximal assistance   Upper Body Dressing : Moderate assistance   Lower Body Dressing: Maximal assistance     Toilet Transfer Details (indicate cue type and reason): pt with incontinence upon standing at EOB           General ADL Comments: pt is able to complete bed mobility and sit<>stand with mod (A) from therapist and +2 (A with family present. pt is unable to transfer only  sit<.stand     Vision         Perception     Praxis      Pertinent Vitals/Pain Pain Assessment: Faces Pain Score: 10-Worst pain ever Pain Location: R ribs  Pain Descriptors / Indicators: Grimacing;Sharp Pain Intervention(s): Monitored during session;Premedicated before session;Repositioned;Limited activity within patient's tolerance     Hand Dominance Right   Extremity/Trunk Assessment Upper Extremity Assessment  Upper Extremity Assessment: RUE deficits/detail RUE Deficits / Details: R shoulder pain limited ROM.    Lower Extremity Assessment Lower Extremity Assessment: Defer to PT evaluation   Cervical / Trunk Assessment Cervical / Trunk Assessment: Kyphotic   Communication Communication Communication: No difficulties   Cognition Arousal/Alertness: Awake/alert Behavior During Therapy: WFL for tasks  assessed/performed Overall Cognitive Status: Within Functional Limits for tasks assessed                                 General Comments: daughter reeducating patient on requirement to eat hospital food at this time. pt wanting outside food options.    General Comments       Exercises     Shoulder Instructions      Home Living Family/patient expects to be discharged to:: Private residence Living Arrangements: Children Available Help at Discharge: Family;Available 24 hours/day Type of Home: Apartment Home Access: Stairs to enter Entrance Stairs-Number of Steps: 13 Entrance Stairs-Rails: Right Home Layout: One level     Bathroom Shower/Tub: Tub/shower unit;Curtain   Firefighter: Standard     Home Equipment: Environmental consultant - 2 wheels          Prior Functioning/Environment Level of Independence: Needs assistance  Gait / Transfers Assistance Needed: Used RW for ambulation, however, reports she does not need assist.  ADL's / Homemaking Assistance Needed: Reports daughter assists with ADL/IADL tasks.  wears pull ups             OT Problem List: Decreased strength;Decreased activity tolerance;Impaired balance (sitting and/or standing);Decreased safety awareness;Decreased knowledge of use of DME or AE;Decreased knowledge of precautions;Obesity;Pain;Decreased cognition;Decreased range of motion      OT Treatment/Interventions: Self-care/ADL training;Therapeutic exercise;DME and/or AE instruction;Therapeutic activities;Patient/family education;Balance training    OT Goals(Current goals can be found in the care plan section) Acute Rehab OT Goals Patient Stated Goal: to decrease pain  OT Goal Formulation: With patient Time For Goal Achievement: 06/16/18 Potential to Achieve Goals: Good  OT Frequency: Min 2X/week   Barriers to D/C: Decreased caregiver support          Co-evaluation              AM-PAC PT "6 Clicks" Daily Activity     Outcome Measure  Help from another person eating meals?: None Help from another person taking care of personal grooming?: A Lot Help from another person toileting, which includes using toliet, bedpan, or urinal?: A Lot Help from another person bathing (including washing, rinsing, drying)?: A Lot Help from another person to put on and taking off regular upper body clothing?: A Lot Help from another person to put on and taking off regular lower body clothing?: Total 6 Click Score: 13   End of Session Nurse Communication: Mobility status;Precautions  Activity Tolerance: Patient limited by pain Patient left: in bed;with call bell/phone within reach;with family/visitor present  OT Visit Diagnosis: Unsteadiness on feet (R26.81)                Time: 1449-1510 OT Time Calculation (min): 21 min Charges:  OT General Charges $OT Visit: 1 Visit OT Evaluation $OT Eval Moderate Complexity: 1 Mod G-Codes:      Mateo Flow   OTR/L Pager: [phone] Office: [phone] .   Boone Master B 06/02/2018, 4:41 PM

## 2018-06-02 NOTE — Evaluation (Addendum)
Physical Therapy Evaluation Patient Details Name: Carrie Warner MRN: [id number] DOB: [birth year] Today's Date: 06/02/2018   History of Present Illness  Pt is a 72 y/o female presenting to ED following fall. Pt also presenting with R shoulder pain and R rib pain. Imaging negative for R rib fractures. CT of head and abdomen negative for acute abnormality. PMH includes CAD s/p stent placement, DM, MI, CVA, and a fib.   Clinical Impression  Pt presenting with problem above and deficits below. Pt very limited this session secondary to increased pain in R ribs and R shoulder. Required mod A to stand at EOB, however, further mobility deferred secondary to increased pain. Per RN, attempted to assist with ambulation on 6/24, and pt only able to go short distance without buckling. Feel pt is at an increased risk for falls and will not be able to perform mobility tasks without increased assist. Will continue to follow acutely to maximize functional mobility independence and safety.   Addendum: Spoke with OT and per notes, pt may be d/c'd home. Continue to recommend SNF, however, if pt is d/c'd home will need max HH services, and DME below.     Follow Up Recommendations SNF;Supervision/Assistance - 24 hour    Equipment Recommendations  3in1 (PT), Wheelchair, Wheelchair cushion   Recommendations for Other Services OT consult     Precautions / Restrictions Precautions Precautions: Fall Precaution Comments: Reports multiple falls within the past 6 weeks, however, did not recall the fall prior to admission.  Restrictions Weight Bearing Restrictions: No      Mobility  Bed Mobility Overal bed mobility: Needs Assistance Bed Mobility: Rolling;Sidelying to Sit;Sit to Sidelying Rolling: Min assist Sidelying to sit: Mod assist     Sit to sidelying: Mod assist General bed mobility comments: Min A for assist with rolling. Mod A for trunk elevation to come to sitting, and mod A for LE assist for return  to sidelying.   Transfers Overall transfer level: Needs assistance Equipment used: 1 person hand held assist Transfers: Sit to/from Stand Sit to Stand: Mod assist         General transfer comment: Mod A for lift assist and steadying to stand to change bed sheets. Pt with increased pain, so ambulation not attempted. PT stood in front of pt and pt held on to PT.   Ambulation/Gait             General Gait Details: Deferred, however, per RN attempted gait on 6/24, requiring +2 RN assist and only able to ambulate a few feet before knees buckling. RN reports pt requiring "a lot" of assist to ambulate.   Stairs            Wheelchair Mobility    Modified Rankin (Stroke Patients Only)       Balance Overall balance assessment: Needs assistance Sitting-balance support: No upper extremity supported;Feet supported Sitting balance-Leahy Scale: Fair     Standing balance support: Bilateral upper extremity supported Standing balance-Leahy Scale: Poor Standing balance comment: Reliant on BUE support and external support to stand                             Pertinent Vitals/Pain Pain Assessment: Faces Faces Pain Scale: Hurts whole lot Pain Location: R ribs  Pain Descriptors / Indicators: Grimacing;Sharp Pain Intervention(s): Limited activity within patient's tolerance;Monitored during session;Repositioned    Home Living Family/patient expects to be discharged to:: Private residence Living Arrangements: Children Available Help  at Discharge: Family;Available 24 hours/day Type of Home: Apartment Home Access: Stairs to enter Entrance Stairs-Rails: Right Entrance Stairs-Number of Steps: 13 Home Layout: One level Home Equipment: Walker - 2 wheels      Prior Function Level of Independence: Needs assistance   Gait / Transfers Assistance Needed: Used RW for ambulation, however, reports she does not need assist.   ADL's / Homemaking Assistance Needed: Reports  daughter assists with ADL/IADL tasks.         Hand Dominance        Extremity/Trunk Assessment   Upper Extremity Assessment Upper Extremity Assessment: RUE deficits/detail RUE Deficits / Details: R shoulder pain limited ROM.     Lower Extremity Assessment Lower Extremity Assessment: Generalized weakness    Cervical / Trunk Assessment Cervical / Trunk Assessment: Kyphotic  Communication   Communication: No difficulties  Cognition Arousal/Alertness: Awake/alert Behavior During Therapy: WFL for tasks assessed/performed Overall Cognitive Status: No family/caregiver present to determine baseline cognitive functioning                                 General Comments: Pt with difficulty recalling fall prior to presentation to the ED.       General Comments General comments (skin integrity, edema, etc.): No family present during session.     Exercises     Assessment/Plan    PT Assessment Patient needs continued PT services  PT Problem List Decreased strength;Decreased balance;Decreased activity tolerance;Decreased mobility;Decreased knowledge of precautions;Decreased knowledge of use of DME;Decreased cognition;Pain       PT Treatment Interventions DME instruction;Gait training;Functional mobility training;Therapeutic activities;Therapeutic exercise;Balance training;Patient/family education    PT Goals (Current goals can be found in the Care Plan section)  Acute Rehab PT Goals Patient Stated Goal: to decrease pain  PT Goal Formulation: With patient Time For Goal Achievement: 06/16/18 Potential to Achieve Goals: Fair    Frequency Min 2X/week   Barriers to discharge        Co-evaluation               AM-PAC PT "6 Clicks" Daily Activity  Outcome Measure Difficulty turning over in bed (including adjusting bedclothes, sheets and blankets)?: Unable Difficulty moving from lying on back to sitting on the side of the bed? : Unable Difficulty sitting  down on and standing up from a chair with arms (e.g., wheelchair, bedside commode, etc,.)?: Unable Help needed moving to and from a bed to chair (including a wheelchair)?: A Lot Help needed walking in hospital room?: A Lot Help needed climbing 3-5 steps with a railing? : Total 6 Click Score: 8    End of Session   Activity Tolerance: Patient limited by pain Patient left: in bed;with call bell/phone within reach;with nursing/sitter in room Nurse Communication: Mobility status PT Visit Diagnosis: Unsteadiness on feet (R26.81);Muscle weakness (generalized) (M62.81);Pain Pain - Right/Left: Right Pain - part of body: (ribs )    Time: 8295-6213 PT Time Calculation (min) (ACUTE ONLY): 17 min   Charges:   PT Evaluation $PT Eval Moderate Complexity: 1 Mod     PT G Codes:        Gladys Damme, PT, DPT  Acute Rehabilitation Services  Pager: [phone]   Lehman Prom 06/02/2018, 9:51 AM

## 2018-06-02 NOTE — Progress Notes (Signed)
Patient suffers from instability, multiple falls which impairs their ability to perform daily activities like ambulation, ADL tasks safely in the home.  A walker alone will not resolve the issues with performing activities of daily living. A wheelchair will allow patient to safely perform daily activities.  The patient can self propel in the home or has a caregiver who can provide assistance.     Gladys Damme, PT, DPT  Acute Rehabilitation Services  Pager: [phone]

## 2018-06-02 NOTE — ED Notes (Signed)
Case manager at bedside to update family-Monique,RN

## 2018-06-02 NOTE — ED Notes (Signed)
Pt c/o pain at right shoulder. Pt states she noted this since yesterday but felt her ribs were worse. Pt staets right shouldr 3/10 and ribs now 6/10. Will discuss with Dr. Juleen China.

## 2018-06-02 NOTE — ED Notes (Signed)
CBG was taken after pt had eaten.

## 2018-06-03 ENCOUNTER — Other Ambulatory Visit: Payer: Self-pay

## 2018-06-03 LAB — CBG MONITORING, ED
Glucose-Capillary: 123 mg/dL — ABNORMAL HIGH (ref 70–99)
Glucose-Capillary: 129 mg/dL — ABNORMAL HIGH (ref 70–99)

## 2018-06-03 NOTE — Discharge Planning (Signed)
Patient suffers from gait impairment which impairs their ability to perform daily activities like dressing in the home.  A cane will not resolve  issue with performing activities of daily living. A wheelchair will allow patient to safely perform daily activities. Patient can safely propel the wheelchair in the home or has a caregiver who can provide assistance.  Accessories: elevating leg rests (ELRs), wheel locks, extensions and anti-tippers

## 2018-06-03 NOTE — Progress Notes (Signed)
Inpatient Diabetes Program Recommendations  AACE/ADA: New Consensus Statement on Inpatient Glycemic Control (2015)  Target Ranges:  Prepandial:   less than 140 mg/dL      Peak postprandial:   less than 180 mg/dL (1-2 hours)      Critically ill patients:  140 - 180 mg/dL   Results for BENJIE, WORTHAN (MRN [id number]) as of 06/03/2018 11:01  Ref. Range 06/01/2018 09:32 06/02/2018 11:28 06/02/2018 18:42 06/02/2018 22:45 06/03/2018 07:17  Glucose-Capillary Latest Ref Range: 70 - 99 mg/dL 88 388 (H) 719 (H) 597 (H) 123 (H)   Review of Glycemic Control  Diabetes history: DM 2 Outpatient Diabetes medications: Lantus 45 units BID, Metformin 1000 mg BID Current orders for Inpatient glycemic control: Lantus 45 units BID, Metformin 1000 mg BID  Inpatient Diabetes Program Recommendations:    Glucose trends increase during the day. Consider glycemic control order set Novolog Moderate Correction 0-15 units tid.  Thanks,  Christena Deem RN, MSN, BC-ADM, [facility] Inpatient Diabetes Coordinator Team Pager [phone] (8a-5p)

## 2018-06-03 NOTE — ED Notes (Signed)
Lunch ordered for patient.

## 2018-06-03 NOTE — ED Notes (Signed)
Gown, sheets, bedding changed after patient urinated in bed. Patient repositioned. Purewick placed again. Instructed patient regarding purewick. Verbalizes understanding.

## 2018-06-03 NOTE — ED Notes (Signed)
CALLED [facility] FOR PT TRANSPORT  

## 2018-06-03 NOTE — ED Notes (Signed)
[facility] called for transport.  

## 2018-06-03 NOTE — ED Notes (Signed)
OT in room with patient at this time.

## 2018-06-03 NOTE — ED Notes (Signed)
Daughter called the offgoing RN and told her that hospital bed and WC has been delivered to home, and that pt will have to be transported by [facility] due to inability to walk.

## 2018-06-03 NOTE — ED Notes (Signed)
Pt sleeping. 

## 2018-06-03 NOTE — Progress Notes (Signed)
Occupational Therapy Treatment Patient Details Name: Carrie Warner MRN: [id number] DOB: 01-21-1946 Today's Date: 06/03/2018    History of present illness Pt is a 72 y/o female presenting to ED following fall. Pt also presenting with R shoulder pain and R rib pain. Imaging negative for R rib fractures. CT of head and abdomen negative for acute abnormality. PMH includes CAD s/p stent placement, DM, MI, CVA, and a fib.    OT comments  Pt required modA for sit<>stand and stand-pivot transfers to simulate transfers to wheelchair, BSC, and bed/chair to maximize pt's independence with ADLs and promote caregiver's ability to assist pt with ALDs. Pt currently with functional limitiations due to the deficits listed below (see OT problem list). Pt will benefit from skilled OT to increase their independence and safety with adls and balance to allow discharge to SNF.   Follow Up Recommendations  SNF    Equipment Recommendations  3 in 1 bedside commode;Wheelchair (measurements OT);Wheelchair cushion (measurements OT);Hospital bed    Recommendations for Other Services      Precautions / Restrictions Precautions Precautions: Fall Precaution Comments: Reports multiple falls within the past 6 weeks, however, did not recall the fall prior to admission.  Restrictions Weight Bearing Restrictions: No       Mobility Bed Mobility Overal bed mobility: Needs Assistance Bed Mobility: Rolling;Sidelying to Sit;Sit to Sidelying Rolling: Min assist Sidelying to sit: Mod assist;HOB elevated     Sit to sidelying: Mod assist;HOB elevated General bed mobility comments: pt required use of handrails and physcial assist for trunk elevation to come to sitting and modA to bring BLE onto bed;  Transfers Overall transfer level: Needs assistance Equipment used: 1 person hand held assist Transfers: Sit to/from [employer] Sit to Stand: Mod assist Stand pivot transfers: Mod assist       General  transfer comment: VC for safe hand placement;modA for powerup and steadying in static stand    Balance Overall balance assessment: Needs assistance Sitting-balance support: No upper extremity supported;Feet supported Sitting balance-Leahy Scale: Fair     Standing balance support: Bilateral upper extremity supported Standing balance-Leahy Scale: Poor Standing balance comment: reliant on BUE support and external support to stand                           ADL either performed or assessed with clinical judgement   ADL Overall ADL's : Needs assistance/impaired             Lower Body Bathing: Maximal assistance       Lower Body Dressing: Maximal assistance   Toilet Transfer: Moderate assistance;Stand-pivot Toilet Transfer Details (indicate cue type and reason): simulated transfer from EOB to chair VC for safe hand placement         Functional mobility during ADLs: Moderate assistance General ADL Comments: pt held breath during all mobility, required VC for pursed lip breathing;pt significantly limited by pain;pt able to complete sit<>stand and stand-pivot with modA to simulate transfer to [employer]      Vision       Perception     Praxis      Cognition Arousal/Alertness: Awake/alert Behavior During Therapy: WFL for tasks assessed/performed Overall Cognitive Status: Within Functional Limits for tasks assessed  Exercises     Shoulder Instructions       General Comments no family present during session;    Pertinent Vitals/ Pain       Pain Assessment: 0-10 Pain Score: 8  Pain Location: R ribs Pain Descriptors / Indicators: Grimacing;Sharp Pain Intervention(s): Monitored during session  Home Living                                          Prior Functioning/Environment              Frequency  Min 2X/week        Progress Toward Goals  OT Goals(current  goals can now be found in the care plan section)  Progress towards OT goals: Progressing toward goals  Acute Rehab OT Goals Patient Stated Goal: to decrease pain  OT Goal Formulation: With patient Time For Goal Achievement: 06/16/18 Potential to Achieve Goals: Good  Plan Discharge plan remains appropriate    Co-evaluation                 AM-PAC PT "6 Clicks" Daily Activity     Outcome Measure   Help from another person eating meals?: None Help from another person taking care of personal grooming?: A Lot Help from another person toileting, which includes using toliet, bedpan, or urinal?: A Lot Help from another person bathing (including washing, rinsing, drying)?: A Lot Help from another person to put on and taking off regular upper body clothing?: A Lot Help from another person to put on and taking off regular lower body clothing?: A Lot 6 Click Score: 14    End of Session Equipment Utilized During Treatment: Gait belt  OT Visit Diagnosis: Unsteadiness on feet (R26.81)   Activity Tolerance Patient limited by pain   Patient Left in bed;with call bell/phone within reach;with family/visitor present   Nurse Communication Mobility status        Time:  -     Charges:    Diona Browner OTS     Diona Browner 06/03/2018, 2:11 PM

## 2018-06-03 NOTE — Care Management (Signed)
ED CM spoke with patient's daughter confirmed equipment and bed delivery. Patient will be transported home by Sharin Mons, Updated nurse on [place] [facility] .

## 2018-06-03 NOTE — Discharge Planning (Signed)
EDCM confirmed with [facility] agency that DME will be delivered today.  Pt daughter will wait for DME to be delivered and before returning to hospital.

## 2018-06-04 NOTE — ED Notes (Signed)
Called to see if [facility] still aware of pt. They are, but very busy.

## 2018-06-23 ENCOUNTER — Encounter (INDEPENDENT_AMBULATORY_CARE_PROVIDER_SITE_OTHER): Payer: Medicare HMO | Admitting: Ophthalmology

## 2018-07-01 ENCOUNTER — Ambulatory Visit (INDEPENDENT_AMBULATORY_CARE_PROVIDER_SITE_OTHER): Payer: Medicare HMO | Admitting: Internal Medicine

## 2018-07-01 ENCOUNTER — Encounter: Payer: Self-pay | Admitting: Internal Medicine

## 2018-07-01 VITALS — BP 128/74 | HR 67 | Ht 62.0 in | Wt 189.0 lb

## 2018-07-01 DIAGNOSIS — E782 Mixed hyperlipidemia: Secondary | ICD-10-CM

## 2018-07-01 DIAGNOSIS — I2583 Coronary atherosclerosis due to lipid rich plaque: Secondary | ICD-10-CM

## 2018-07-01 DIAGNOSIS — I2510 Atherosclerotic heart disease of native coronary artery without angina pectoris: Secondary | ICD-10-CM | POA: Diagnosis not present

## 2018-07-01 DIAGNOSIS — R0602 Shortness of breath: Secondary | ICD-10-CM

## 2018-07-01 MED ORDER — EZETIMIBE 10 MG PO TABS: 10.0000 mg | ORAL_TABLET | Freq: Every day | ORAL | 3 refills | Status: AC

## 2018-07-01 NOTE — Progress Notes (Signed)
OFFICE NOTE  Chief Complaint:  Follow-up CAD, dyspnea  Primary Care Physician: Jackie Plum, MD  HPI:  Carrie Warner is a 72 y.o. female with a past medial history significant for the 2 diabetes, hypertension, dyslipidemia and moderate obesity. Recently she presented to [place] with symptoms concerning for stroke. She was evaluated and treated for right brain infarct and has some minimal deficits. She's currently on aspirin and Plavix. She was found to have an elevated LDL 126 and started on Lipitor 40 mg daily. She also reports significant fatigue and daytime sleepiness which is worsened since her stroke. Her Epworth sleepiness scale score was 21. She was noted to have episodes of apnea while hospitalized. She denies any chest pain, but reports shortness of breath with exertion and fatigue. This all seems to be somewhat new. She recently moved back to [place] from [place] and is staying with her children. 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 There is extensive intracranial atherosclerosis. She also reports pain in her legs when walking. She feels that her feet are cold and her symptoms improved somewhat with rest.   10/28/2017  Carrie Warner returns today for follow-up.  Previously I referred her for stress testing which showed an abnormal finding of anterior ischemia.  She was referred for cardiac catheterization with Dr. Herbie Baltimore and underwent left heart catheterization on September 26, 2017, having been found to have significant stenosis of the LAD.  Unfortunately she suffered a peri-procedural MI and had "no-reflow".  This was complicated by atrial fibrillation.  She spent some time in the ICU and was started on Xarelto in addition to aspirin and Plavix.  Ultimately she was discharged and returns today for follow-up.  She reports being short of breath.  She says this is been somewhat  persistent since her procedure.  Her weight actually is unchanged from her preprocedure weight.  She denies any swelling.  She has no further angina.  EKG shows she is back in sinus rhythm today.  07/01/2018  Carrie Warner was seen today for follow-up.  She denies any more anginal symptoms.  Unfortunately she has had recent falls and was seen in the hospital for this.  She did have stroke after her procedure to open the LAD with no reflow.  Unfortunately she did not have a subsequent echo after cath.  She reports shortness of breath with exertion and has had some lower extremity edema.  Blood pressures well controlled today.  She is on low-dose Lasix 20 mg.  Recent lab work shows poorly controlled diabetes with hemoglobin A1c of 12.8.  LDL 126.  This is on high intensity statin.  She reports difficulty affording her medications.  She says she is getting Xarelto samples in order to take her medication.  Currently she is taking aspirin, Plavix and Xarelto although I previously recommended only Plavix and Xarelto.   PMHx:  Past Medical History:  Diagnosis Date  . Coronary artery disease involving native coronary artery of native heart with angina pectoris (HCC) 09/26/2017   Cath for Abnormal Myoivew - Anterior Ischemia: Mid LAD-2 lesion, 90 %stenosed. After atherectomyand PTCA, there was no residual stenosis. Post intervention, there is a 0% residual stenosis. Mid LAD-1 lesion, 70 %stenosed. THis was treated with atherectomy, PTCA and stent placement. A STENT SIERRA 3.00 X 18 MM drug eluting stent was successfully placed. Post intervention, there is a 0% residual ste  . Diabetes mellitus without complication (  HCC)   . Myocardial infarction involving left anterior descending (LAD) coronary artery (HCC) 09/26/2017   Type 4a MI - post PCI  . PAF (paroxysmal atrial fibrillation) (HCC) 09/25/2017   Noted during LAD PCI  . Presence of drug coated stent in LAD coronary artery: STENT SIERRA 3.00 X 18 MM drug  eluting stent 09/26/2017   Mid LAD-2 lesion, 90 %stenosed & Mid LAD-1 lesion, 70 %stenosed. Lesions treated with atherectomy, PTCA and stent placement (in worst segment). A STENT SIERRA 3.00 X 18 MM drug eluting stent was successfully placed.   . Stroke (HCC) 07/2017    Past Surgical History:  Procedure Laterality Date  . CORONARY ATHERECTOMY N/A 09/26/2017   Procedure: CORONARY ATHERECTOMY;  Surgeon: Corky Crafts, MD;  Location: [facility] INVASIVE CV LAB;  Service: Cardiovascular;  Laterality: N/A;  . CORONARY STENT INTERVENTION N/A 09/26/2017   Procedure: CORONARY STENT INTERVENTION;  Surgeon: Corky Crafts, MD;  Location: [facility] INVASIVE CV LAB;  Service: Cardiovascular;  Laterality: N/A;  . LEFT HEART CATH AND CORONARY ANGIOGRAPHY N/A 09/26/2017   Procedure: LEFT HEART CATH AND CORONARY ANGIOGRAPHY;  Surgeon: Corky Crafts, MD;  Location: [facility] INVASIVE CV LAB;  Service: Cardiovascular;  Laterality: N/A;    FAMHx:  Family History  Problem Relation Age of Onset  . Alzheimer's disease Mother   . Heart disease Father   . Alcohol abuse Sister   . Stroke Brother     SOCHx:   reports that she has never smoked. She has never used smokeless tobacco. She reports that she does not drink alcohol or use drugs.  ALLERGIES:  No Known Allergies  ROS: Pertinent items noted in HPI and remainder of comprehensive ROS otherwise negative.  HOME MEDS: Current Outpatient Medications on File Prior to Visit  Medication Sig Dispense Refill  . aspirin EC 81 MG tablet Take 81 mg by mouth daily.    Marland Kitchen atorvastatin (LIPITOR) 40 MG tablet Take 1 tablet (40 mg total) by mouth daily at 6 PM. 30 tablet 6  . Biotin 1000 MCG tablet Take 1,000 mcg by mouth daily.    . cholecalciferol (VITAMIN D) 1000 units tablet Take 1,000 Units by mouth daily.    . clopidogrel (PLAVIX) 75 MG tablet Take 1 tablet (75 mg total) by mouth daily. 90 tablet 3  . Insulin Glargine (BASAGLAR KWIKPEN) 100 UNIT/ML SOPN Inject  45 Units into the skin 2 (two) times daily.    Marland Kitchen losartan (COZAAR) 50 MG tablet Take 1 tablet (50 mg total) by mouth daily. 60 tablet 6  . metFORMIN (GLUCOPHAGE) 1000 MG tablet Take 1,000 mg by mouth 2 (two) times daily.    . metoprolol tartrate (LOPRESSOR) 25 MG tablet Take 1 tablet (25 mg total) by mouth 2 (two) times daily. 60 tablet 1  . mirtazapine (REMERON) 15 MG tablet Take 1 tablet by mouth daily.    . nitroGLYCERIN (NITROSTAT) 0.4 MG SL tablet Place 1 tablet (0.4 mg total) under the tongue every 5 (five) minutes as needed. 25 tablet 2  . Rivaroxaban (XARELTO) 15 MG TABS tablet Take 1 tablet (15 mg total) by mouth daily with supper. 90 tablet 0  . traMADol (ULTRAM) 50 MG tablet Take 1 tablet (50 mg total) by mouth every 6 (six) hours as needed for severe pain. 15 tablet 0  . furosemide (LASIX) 20 MG tablet Take 1 tablet (20 mg total) by mouth daily. 90 tablet 3   No current facility-administered medications on file prior to visit.  LABS/IMAGING: No results found for this or any previous visit (from the past 48 hour(s)). No results found.  LIPID PANEL:    Component Value Date/Time   CHOL 182 07/21/2017 0329   TRIG 119 07/21/2017 0329   HDL 32 (L) 07/21/2017 0329   CHOLHDL 5.7 07/21/2017 0329   VLDL 24 07/21/2017 0329   LDLCALC 126 (H) 07/21/2017 0329     WEIGHTS: Wt Readings from Last 3 Encounters:  07/01/18 189 lb (85.7 kg)  06/01/18 189 lb (85.7 kg)  05/22/18 187 lb (84.8 kg)    VITALS: BP 128/74   Pulse 67   Ht 5\' 2"  (1.575 m)   Wt 189 lb (85.7 kg)   SpO2 95%   BMI 34.57 kg/m   EXAM: General appearance: alert and no distress Neck: JVD - 3 cm above sternal notch, no carotid bruit and thyroid not enlarged, symmetric, no tenderness/mass/nodules Lungs: diminished breath sounds bibasilar Heart: regular rate and rhythm Abdomen: soft, non-tender; bowel sounds normal; no masses,  no organomegaly and Mildly obese Extremities: extremities normal, atraumatic, no  cyanosis or edema Pulses: Decreased pedal pulses Skin: Skin color, texture, turgor normal. No rashes or lesions Neurologic: Grossly normal Psych: Pleasant  EKG: Sinus rhythm 67, possible anterior infarct-personally reviewed  ASSESSMENT: 1. CAD status post DES to the LAD-complicated by no reflow, (09/26/2017) 2. Paroxysmal atrial fibrillation- CHADSVASC score of 5 (on Xarelto) 3. Recent stroke with extensive atherosclerosis and intracranial atherosclerosis 4. Type 2 diabetes 5. Hypertension 6. Dyslipidemia 7. Nonrestorative sleep and daytime fatigue 8. Leg pain and cool feet 9. DOE  PLAN: 1.   Carrie Warner reports persistent DOE - given her anterior infarct, there could be resultant CHF. She is on low dose lasix. Will check 2D echo and BMET/BNP today, may need to increase diuretics. On exam, her neck veins appear up and breath sounds are diminished at the bases. She is to remain on Plavix and Xarelto until October 2019, then transition to aspirin and Xarelto afterwards. Finally, she is not at goal LDL <70. Will start ezetimibe 10 mg daily - cannot afford PCSK9i at this time.  Repeat lipid profile in 3 months.   Follow-up with me after testing.  Chrystie Nose, MD, [facility], FACP  [place]  Medical Director of the [facility]  [facility] Attending Cardiologist  Direct Dial: [phone]  Fax: [phone]  Website:  [email or web address] 07/01/2018, 11:01 AM

## 2018-07-01 NOTE — Patient Instructions (Signed)
Medication Instructions:   START zetia 10mg  once daily  Labwork:  Your physician recommends that you return for lab work TODAY - BMET, BNP   Testing/Procedures:  Your physician has requested that you have an echocardiogram. Echocardiography is a painless test that uses sound waves to create images of your heart. It provides your doctor with information about the size and shape of your heart and how well your heart's chambers and valves are working. This procedure takes approximately one hour. There are no restrictions for this procedure. -- done at [address] - [address]  Follow-Up:  Your physician recommends that you schedule a follow-up appointment in about 6 weeks with Dr. Rennis Golden.   If you need a refill on your cardiac medications before your next appointment, please call your pharmacy.  Any Other Special Instructions Will Be Listed Below (If Applicable).

## 2018-07-02 ENCOUNTER — Other Ambulatory Visit: Payer: Self-pay | Admitting: *Deleted

## 2018-07-02 LAB — BASIC METABOLIC PANEL
BUN/Creatinine Ratio: 10 — ABNORMAL LOW (ref 12–28)
BUN: 9 mg/dL (ref 8–27)
CALCIUM: 9.2 mg/dL (ref 8.7–10.3)
CO2: 27 mmol/L (ref 20–29)
CREATININE: 0.91 mg/dL (ref 0.57–1.00)
Chloride: 102 mmol/L (ref 96–106)
GFR calc Af Amer: 72 mL/min/{1.73_m2} (ref >59)
GFR, EST NON AFRICAN AMERICAN: 63 mL/min/{1.73_m2} (ref >59)
GLUCOSE: 307 mg/dL — AB (ref 65–99)
Potassium: 4.4 mmol/L (ref 3.5–5.2)
SODIUM: 140 mmol/L (ref 134–144)

## 2018-07-02 LAB — PRO B NATRIURETIC PEPTIDE: NT-Pro BNP: 144 pg/mL (ref 0–301)

## 2018-07-02 MED ORDER — CLOPIDOGREL BISULFATE 75 MG PO TABS: 75.0000 mg | ORAL_TABLET | Freq: Every day | ORAL | 3 refills | Status: DC

## 2018-07-02 MED ORDER — LOSARTAN POTASSIUM 50 MG PO TABS: 50.0000 mg | ORAL_TABLET | Freq: Every day | ORAL | 3 refills | Status: AC

## 2018-07-02 MED ORDER — FUROSEMIDE 20 MG PO TABS: 20.0000 mg | ORAL_TABLET | Freq: Every day | ORAL | 3 refills | Status: AC

## 2018-07-02 NOTE — Addendum Note (Signed)
Addended by: Micki Riley C on: 07/02/2018 04:13 PM   Modules accepted: Orders

## 2018-07-09 ENCOUNTER — Ambulatory Visit (HOSPITAL_COMMUNITY)
Admission: RE | Admit: 2018-07-09 | Discharge: 2018-07-09 | Disposition: A | Discharge status: Home / Self Care | Payer: Medicare HMO | Source: Ambulatory Visit | Attending: Cardiology | Admitting: Cardiology

## 2018-07-09 ENCOUNTER — Other Ambulatory Visit (HOSPITAL_COMMUNITY): Payer: Medicare HMO

## 2018-07-09 DIAGNOSIS — R209 Unspecified disturbances of skin sensation: Secondary | ICD-10-CM | POA: Insufficient documentation

## 2018-07-09 DIAGNOSIS — R0602 Shortness of breath: Secondary | ICD-10-CM | POA: Diagnosis not present

## 2018-07-10 ENCOUNTER — Ambulatory Visit (HOSPITAL_COMMUNITY): Payer: Medicare HMO | Attending: Cardiovascular Disease

## 2018-07-10 ENCOUNTER — Other Ambulatory Visit: Payer: Self-pay

## 2018-07-10 DIAGNOSIS — I2510 Atherosclerotic heart disease of native coronary artery without angina pectoris: Secondary | ICD-10-CM | POA: Insufficient documentation

## 2018-07-10 DIAGNOSIS — R0602 Shortness of breath: Secondary | ICD-10-CM

## 2018-07-10 DIAGNOSIS — I219 Acute myocardial infarction, unspecified: Secondary | ICD-10-CM | POA: Insufficient documentation

## 2018-07-10 DIAGNOSIS — Z6834 Body mass index (BMI) 34.0-34.9, adult: Secondary | ICD-10-CM | POA: Insufficient documentation

## 2018-07-10 DIAGNOSIS — I2583 Coronary atherosclerosis due to lipid rich plaque: Secondary | ICD-10-CM | POA: Diagnosis not present

## 2018-07-10 DIAGNOSIS — E119 Type 2 diabetes mellitus without complications: Secondary | ICD-10-CM | POA: Insufficient documentation

## 2018-07-10 DIAGNOSIS — I480 Paroxysmal atrial fibrillation: Secondary | ICD-10-CM | POA: Insufficient documentation

## 2018-07-10 DIAGNOSIS — E669 Obesity, unspecified: Secondary | ICD-10-CM | POA: Insufficient documentation

## 2018-07-10 DIAGNOSIS — Z8673 Personal history of transient ischemic attack (TIA), and cerebral infarction without residual deficits: Secondary | ICD-10-CM | POA: Insufficient documentation

## 2018-07-10 MED ORDER — PERFLUTREN LIPID MICROSPHERE
1.0000 mL | INTRAVENOUS | Status: AC | PRN
  Administered 2018-07-10: 2 mL via INTRAVENOUS

## 2018-07-27 ENCOUNTER — Ambulatory Visit: Payer: Medicare HMO | Admitting: Endocrinology

## 2018-07-27 DIAGNOSIS — Z0289 Encounter for other administrative examinations: Secondary | ICD-10-CM

## 2018-08-05 ENCOUNTER — Other Ambulatory Visit: Payer: Self-pay

## 2018-08-05 MED ORDER — ATORVASTATIN CALCIUM 40 MG PO TABS: 40.0000 mg | ORAL_TABLET | Freq: Every day | ORAL | 3 refills | Status: AC

## 2018-08-05 MED ORDER — METOPROLOL TARTRATE 25 MG PO TABS: 25.0000 mg | ORAL_TABLET | Freq: Two times a day (BID) | ORAL | 3 refills | Status: AC

## 2018-08-24 ENCOUNTER — Ambulatory Visit: Payer: Medicare HMO | Admitting: Internal Medicine

## 2018-09-09 ENCOUNTER — Encounter (HOSPITAL_COMMUNITY): Payer: Self-pay | Admitting: Emergency Medicine

## 2018-09-09 ENCOUNTER — Emergency Department (HOSPITAL_COMMUNITY): Payer: Medicare HMO

## 2018-09-09 ENCOUNTER — Emergency Department (HOSPITAL_COMMUNITY)
Admission: EM | Admit: 2018-09-09 | Discharge: 2018-09-09 | Disposition: A | Discharge status: Home / Self Care | Payer: Medicare HMO | Attending: Emergency Medicine | Admitting: Emergency Medicine

## 2018-09-09 DIAGNOSIS — Y998 Other external cause status: Secondary | ICD-10-CM | POA: Insufficient documentation

## 2018-09-09 DIAGNOSIS — Y9301 Activity, walking, marching and hiking: Secondary | ICD-10-CM | POA: Insufficient documentation

## 2018-09-09 DIAGNOSIS — Z79899 Other long term (current) drug therapy: Secondary | ICD-10-CM | POA: Insufficient documentation

## 2018-09-09 DIAGNOSIS — R51 Headache: Secondary | ICD-10-CM | POA: Diagnosis present

## 2018-09-09 DIAGNOSIS — M25561 Pain in right knee: Secondary | ICD-10-CM | POA: Insufficient documentation

## 2018-09-09 DIAGNOSIS — Z7982 Long term (current) use of aspirin: Secondary | ICD-10-CM | POA: Insufficient documentation

## 2018-09-09 DIAGNOSIS — Z8673 Personal history of transient ischemic attack (TIA), and cerebral infarction without residual deficits: Secondary | ICD-10-CM | POA: Diagnosis not present

## 2018-09-09 DIAGNOSIS — R0789 Other chest pain: Secondary | ICD-10-CM | POA: Insufficient documentation

## 2018-09-09 DIAGNOSIS — W19XXXA Unspecified fall, initial encounter: Secondary | ICD-10-CM

## 2018-09-09 DIAGNOSIS — Z7902 Long term (current) use of antithrombotics/antiplatelets: Secondary | ICD-10-CM | POA: Diagnosis not present

## 2018-09-09 DIAGNOSIS — Y929 Unspecified place or not applicable: Secondary | ICD-10-CM | POA: Insufficient documentation

## 2018-09-09 DIAGNOSIS — I10 Essential (primary) hypertension: Secondary | ICD-10-CM | POA: Insufficient documentation

## 2018-09-09 DIAGNOSIS — Z955 Presence of coronary angioplasty implant and graft: Secondary | ICD-10-CM | POA: Insufficient documentation

## 2018-09-09 DIAGNOSIS — Z7901 Long term (current) use of anticoagulants: Secondary | ICD-10-CM | POA: Insufficient documentation

## 2018-09-09 DIAGNOSIS — E119 Type 2 diabetes mellitus without complications: Secondary | ICD-10-CM | POA: Diagnosis not present

## 2018-09-09 DIAGNOSIS — I2510 Atherosclerotic heart disease of native coronary artery without angina pectoris: Secondary | ICD-10-CM | POA: Insufficient documentation

## 2018-09-09 DIAGNOSIS — W01198A Fall on same level from slipping, tripping and stumbling with subsequent striking against other object, initial encounter: Secondary | ICD-10-CM | POA: Insufficient documentation

## 2018-09-09 DIAGNOSIS — I252 Old myocardial infarction: Secondary | ICD-10-CM | POA: Diagnosis not present

## 2018-09-09 DIAGNOSIS — R739 Hyperglycemia, unspecified: Secondary | ICD-10-CM

## 2018-09-09 LAB — BASIC METABOLIC PANEL
ANION GAP: 10 (ref 5–15)
BUN: 12 mg/dL (ref 8–23)
CHLORIDE: 102 mmol/L (ref 98–111)
CO2: 28 mmol/L (ref 22–32)
Calcium: 10 mg/dL (ref 8.9–10.3)
Creatinine, Ser: 0.9 mg/dL (ref 0.44–1.00)
GFR calc Af Amer: >60 mL/min (ref >60)
GFR calc non Af Amer: >60 mL/min (ref >60)
GLUCOSE: 377 mg/dL — AB (ref 70–99)
POTASSIUM: 3.7 mmol/L (ref 3.5–5.1)
Sodium: 140 mmol/L (ref 135–145)

## 2018-09-09 LAB — CBC
HEMATOCRIT: 37.8 % (ref 36.0–46.0)
HEMOGLOBIN: 12.2 g/dL (ref 12.0–15.0)
MCH: 30.1 pg (ref 26.0–34.0)
MCHC: 32.3 g/dL (ref 30.0–36.0)
MCV: 93.3 fL (ref 78.0–100.0)
Platelets: 212 10*3/uL (ref 150–400)
RBC: 4.05 MIL/uL (ref 3.87–5.11)
RDW: 13.2 % (ref 11.5–15.5)
WBC: 6.3 10*3/uL (ref 4.0–10.5)

## 2018-09-09 MED ORDER — BASAGLAR KWIKPEN 100 UNIT/ML ~~LOC~~ SOPN: 45.0000 [IU] | PEN_INJECTOR | Freq: Two times a day (BID) | SUBCUTANEOUS | 1 refills | Status: DC

## 2018-09-09 MED ORDER — ACETAMINOPHEN 325 MG PO TABS
650.0000 mg | ORAL_TABLET | Freq: Once | ORAL | Status: AC
  Administered 2018-09-09: 650 mg via ORAL
  Filled 2018-09-09: qty 2

## 2018-09-09 NOTE — ED Triage Notes (Signed)
Pt tripped on suitcase this morning and fell. Denies LOC or taking thinners but c/o right side head pain

## 2018-09-09 NOTE — Discharge Instructions (Signed)
Take over-the-counter medications as needed for pain, follow-up with your primary care doctor, make sure to start taking her insulin

## 2018-09-09 NOTE — ED Provider Notes (Signed)
[facility]-EMERGENCY DEPT Provider Note   CSN: [id number] Arrival date & time: 09/09/18  1120     History   Chief Complaint Chief Complaint  Patient presents with  . Fall  . Headache    HPI Carrie Warner is a 72 y.o. female.  HPI Patient presents to the emergency room for evaluation of a fall.  Patient states she is not sure but she may have tripped over a suitcase this morning.  She fell landing on the right side of her head.  She is also having some pain in her right ribs as well as her right knee.  Patient denies any loss of consciousness.  No nausea or vomiting.  No chest pain or shortness of breath.  No focal numbness or weakness.  Headache is severe on the right side of her head.  It increases with palpation. Past Medical History:  Diagnosis Date  . Coronary artery disease involving native coronary artery of native heart with angina pectoris (HCC) 09/26/2017   Cath for Abnormal Myoivew - Anterior Ischemia: Mid LAD-2 lesion, 90 %stenosed. After atherectomyand PTCA, there was no residual stenosis. Post intervention, there is a 0% residual stenosis. Mid LAD-1 lesion, 70 %stenosed. THis was treated with atherectomy, PTCA and stent placement. A STENT SIERRA 3.00 X 18 MM drug eluting stent was successfully placed. Post intervention, there is a 0% residual ste  . Diabetes mellitus without complication (HCC)   . Myocardial infarction involving left anterior descending (LAD) coronary artery (HCC) 09/26/2017   Type 4a MI - post PCI  . PAF (paroxysmal atrial fibrillation) (HCC) 09/25/2017   Noted during LAD PCI  . Presence of drug coated stent in LAD coronary artery: STENT SIERRA 3.00 X 18 MM drug eluting stent 09/26/2017   Mid LAD-2 lesion, 90 %stenosed & Mid LAD-1 lesion, 70 %stenosed. Lesions treated with atherectomy, PTCA and stent placement (in worst segment). A STENT SIERRA 3.00 X 18 MM drug eluting stent was successfully placed.   . Stroke [facility]) 07/2017     Patient Active Problem List   Diagnosis Date Noted  . Coronary artery disease due to lipid rich plaque 10/28/2017  . Myocardial infarction involving left anterior descending (LAD) coronary artery (HCC) - TYPE 4a periprocedural MI 09/29/2017  . DOE (dyspnea on exertion) 09/26/2017  . Abnormal nuclear stress test 09/26/2017  . New onset atrial fibrillation (HCC) 09/26/2017  . Coronary artery disease involving native coronary artery of native heart with angina pectoris (HCC) 09/26/2017  . Presence of drug coated stent in LAD coronary artery: STENT SIERRA 3.00 X 18 MM drug eluting stent 09/26/2017  . Gait abnormality 09/11/2017  . Excessive daytime sleepiness 09/01/2017  . Cold foot 09/01/2017  . Witnessed apneic spells 09/01/2017  . Shortness of breath 09/01/2017  . Mixed hyperlipidemia   . CVA (cerebral vascular accident) (HCC) 07/20/2017  . Orthostatic hypotension 07/19/2017  . Essential hypertension 07/19/2017  . Type 2 diabetes mellitus with complication (HCC) 07/19/2017    Past Surgical History:  Procedure Laterality Date  . CORONARY ATHERECTOMY N/A 09/26/2017   Procedure: CORONARY ATHERECTOMY;  Surgeon: Corky Crafts, MD;  Location[facility] INVASIVE CV LAB;  Service: Cardiovascular;  Laterality: N/A;  . CORONARY STENT INTERVENTION N/A 09/26/2017   Procedure: CORONARY STENT INTERVENTION;  Surgeon: Corky Crafts, MD;  Location: [facility] INVASIVE CV LAB;  Service: Cardiovascular;  Laterality: N/A;  . LEFT HEART CATH AND CORONARY ANGIOGRAPHY N/A 09/26/2017   Procedure: LEFT HEART CATH AND CORONARY ANGIOGRAPHY;  Surgeon: Eldridge Dace,  Donnie Coffin, MD;  Location: [facility] INVASIVE CV LAB;  Service: Cardiovascular;  Laterality: N/A;     OB History   None      Home Medications    Prior to Admission medications   Medication Sig Start Date End Date Taking? Authorizing Provider  aspirin EC 81 MG tablet Take 81 mg by mouth daily.    [provider]  atorvastatin (LIPITOR) 40  MG tablet Take 1 tablet (40 mg total) by mouth daily at 6 PM. 08/05/18   Hilty, Lisette Abu, MD  Biotin 1000 MCG tablet Take 1,000 mcg by mouth daily.    [provider]  cholecalciferol (VITAMIN D) 1000 units tablet Take 1,000 Units by mouth daily.    [provider]  clopidogrel (PLAVIX) 75 MG tablet Take 1 tablet (75 mg total) by mouth daily. 07/02/18   Hilty, Lisette Abu, MD  ezetimibe (ZETIA) 10 MG tablet Take 1 tablet (10 mg total) by mouth daily. 07/01/18 09/29/18  Chrystie Nose, MD  furosemide (LASIX) 20 MG tablet Take 1 tablet (20 mg total) by mouth daily. 07/02/18 09/30/18  Chrystie Nose, MD  Insulin Glargine (BASAGLAR KWIKPEN) 100 UNIT/ML SOPN Inject 0.45 mLs (45 Units total) into the skin 2 (two) times daily. 09/09/18   Linwood Dibbles, MD  losartan (COZAAR) 50 MG tablet Take 1 tablet (50 mg total) by mouth daily. 07/02/18   Hilty, Lisette Abu, MD  metFORMIN (GLUCOPHAGE) 1000 MG tablet Take 1,000 mg by mouth 2 (two) times daily with a meal.    [provider]  metoprolol tartrate (LOPRESSOR) 25 MG tablet Take 1 tablet (25 mg total) by mouth 2 (two) times daily. 08/05/18   Hilty, Lisette Abu, MD  mirtazapine (REMERON) 15 MG tablet Take 1 tablet by mouth daily. 06/17/18   [provider]  nitroGLYCERIN (NITROSTAT) 0.4 MG SL tablet Place 1 tablet (0.4 mg total) under the tongue every 5 (five) minutes as needed. 09/30/17   Arty Baumgartner, NP  Rivaroxaban (XARELTO) 15 MG TABS tablet Take 15 mg by mouth daily with supper.    [provider]  traMADol (ULTRAM) 50 MG tablet Take 1 tablet (50 mg total) by mouth every 6 (six) hours as needed for severe pain. 06/01/18   Loren Racer, MD    Family History Family History  Problem Relation Age of Onset  . Alzheimer's disease Mother   . Heart disease Father   . Alcohol abuse Sister   . Stroke Brother     Social History Social History   Tobacco Use  . Smoking status: Never Smoker  . Smokeless tobacco:  Never Used  Substance Use Topics  . Alcohol use: No  . Drug use: No     Allergies   Bee venom   Review of Systems Review of Systems  All other systems reviewed and are negative.    Physical Exam Updated Vital Signs BP 140/82 (BP Location: Left Arm)   Pulse 77   Temp 97.7 F (36.5 C) (Oral)   Resp 16   SpO2 94%   Physical Exam  Constitutional: She appears well-developed and well-nourished. No distress.  HENT:  Head: Normocephalic.  Right Ear: External ear normal.  Left Ear: External ear normal.  No focal bruising or swelling, tenderness palpation right parietal region  Eyes: Conjunctivae are normal. Right eye exhibits no discharge. Left eye exhibits no discharge. No scleral icterus.  Neck: Neck supple. No tracheal deviation present.  Cardiovascular: Normal rate, regular rhythm and intact distal  pulses.  Pulmonary/Chest: Effort normal and breath sounds normal. No stridor. No respiratory distress. She has no wheezes. She has no rales.  Abdominal: Soft. Bowel sounds are normal. She exhibits no distension. There is no tenderness. There is no rebound and no guarding.  Musculoskeletal: She exhibits no edema.       Right shoulder: She exhibits no tenderness, no bony tenderness and no swelling.       Left shoulder: She exhibits no tenderness, no bony tenderness and no swelling.       Right wrist: She exhibits no tenderness, no bony tenderness and no swelling.       Left wrist: She exhibits no tenderness, no bony tenderness and no swelling.       Right hip: She exhibits normal range of motion, no tenderness, no bony tenderness and no swelling.       Left hip: She exhibits normal range of motion, no tenderness and no bony tenderness.       Right knee: Tenderness found.       Right ankle: She exhibits no swelling. No tenderness.       Left ankle: She exhibits no swelling. No tenderness.       Cervical back: She exhibits no tenderness, no bony tenderness and no swelling.        Thoracic back: She exhibits no tenderness, no bony tenderness and no swelling.       Lumbar back: She exhibits no tenderness, no bony tenderness and no swelling.  Neurological: She is alert. She has normal strength. No cranial nerve deficit (no facial droop, extraocular movements intact, no slurred speech) or sensory deficit. She exhibits normal muscle tone. She displays no seizure activity. Coordination normal.  Skin: Skin is warm and dry. No rash noted.  Psychiatric: She has a normal mood and affect.  Nursing note and vitals reviewed.    ED Treatments / Results  Labs (all labs ordered are listed, but only abnormal results are displayed) Labs Reviewed  BASIC METABOLIC PANEL - Abnormal; Notable for the following components:      Result Value   Glucose, Bld 377 (*)    All other components within normal limits  CBC    EKG EKG Interpretation  Date/Time:  Wednesday September 09 2018 11:58:21 EDT Ventricular Rate:  87 PR Interval:    QRS Duration: 91 QT Interval:  371 QTC Calculation: 447 R Axis:   -3 Text Interpretation:  Sinus rhythm LVH with secondary repolarization abnormality No significant change since last tracing Confirmed by Linwood Dibbles [id number]) on 09/09/2018 2:15:21 PM   Radiology Dg Ribs Unilateral W/chest Right  Result Date: 09/09/2018 CLINICAL DATA:  Right rib pain after fall today. EXAM: RIGHT RIBS AND CHEST - 3+ VIEW COMPARISON:  Radiographs of June 01, 2018. FINDINGS: No fracture or other bone lesions are seen involving the ribs. There is no evidence of pneumothorax or pleural effusion. Both lungs are clear. Heart size and mediastinal contours are within normal limits. IMPRESSION: Normal right ribs.  No acute cardiopulmonary abnormality seen. Electronically Signed   By: Lupita Raider, M.D.   On: 09/09/2018 12:50   Ct Head Wo Contrast  Result Date: 09/09/2018 CLINICAL DATA:  Done a suitcase this morning and fell, denies loss of consciousness, RIGHT-side head pain,  history coronary artery disease post MI, atrial fibrillation, stroke, type II diabetes mellitus, essential hypertension EXAM: CT HEAD WITHOUT CONTRAST TECHNIQUE: Contiguous axial images were obtained from the base of the skull through the vertex without intravenous  contrast. Sagittal and coronal MPR images reconstructed from axial data set. COMPARISON:  06/01/2018 FINDINGS: Brain: Normal ventricular morphology. No midline shift or mass effect. Small vessel chronic ischemic changes of deep cerebral white matter. Old LEFT cerebellar infarct. No definite intracranial hemorrhage, mass lesion, or evidence of acute infarction. No extra-axial fluid collections. Vascular: No hyperdense collections. Atherosclerotic calcification of internal carotid and vertebral arteries at skull base. Skull: Intact Sinuses/Orbits: Clear Other: N/A IMPRESSION: Small vessel chronic ischemic changes of deep cerebral white matter. Small old LEFT cerebellar infarct. No acute intracranial abnormalities. Electronically Signed   By: Ulyses Southward M.D.   On: 09/09/2018 13:59   Dg Knee Complete 4 Views Right  Result Date: 09/09/2018 CLINICAL DATA:  Right knee pain after fall today. EXAM: RIGHT KNEE - COMPLETE 4+ VIEW COMPARISON:  None. FINDINGS: No evidence of fracture, dislocation, or joint effusion. No evidence of arthropathy. Soft tissues are unremarkable. Moderate patellar spurring is noted. Vascular calcifications are noted. IMPRESSION: No acute abnormality seen in the right knee. Electronically Signed   By: Lupita Raider, M.D.   On: 09/09/2018 12:59    Procedures Procedures (including critical care time)  Medications Ordered in ED Medications  acetaminophen (TYLENOL) tablet 650 mg (650 mg Oral Given 09/09/18 1213)     Initial Impression / Assessment and Plan / ED Course  I have reviewed the triage vital signs and the nursing notes.  Pertinent labs & imaging results that were available during my care of the patient were  reviewed by me and considered in my medical decision making (see chart for details).   Patient presented to the emergency room for evaluation after a fall.  Patient describes a mechanical fall.  X-rays do not show any evidence of serious injuries.  Patient's laboratory tests do show hyperglycemia.  Patient states she has not taken her insulin after running out for the last couple of weeks.  Plan on discharge home with a refill of her insulin.  Final Clinical Impressions(s) / ED Diagnoses   Final diagnoses:  Fall, initial encounter  Hyperglycemia    ED Discharge Orders         Ordered    Insulin Glargine [facility] KWIKPEN) 100 UNIT/ML SOPN  2 times daily     09/09/18 1429           Linwood Dibbles, MD 09/09/18 1432

## 2018-09-16 ENCOUNTER — Ambulatory Visit: Payer: Medicare HMO | Admitting: Internal Medicine

## 2018-09-22 ENCOUNTER — Encounter: Payer: Self-pay | Admitting: Internal Medicine

## 2018-09-22 ENCOUNTER — Ambulatory Visit (INDEPENDENT_AMBULATORY_CARE_PROVIDER_SITE_OTHER): Payer: Medicare HMO | Admitting: Internal Medicine

## 2018-09-22 VITALS — BP 122/74 | HR 67 | Ht 62.0 in | Wt 184.0 lb

## 2018-09-22 DIAGNOSIS — I2510 Atherosclerotic heart disease of native coronary artery without angina pectoris: Secondary | ICD-10-CM

## 2018-09-22 DIAGNOSIS — I2583 Coronary atherosclerosis due to lipid rich plaque: Secondary | ICD-10-CM | POA: Diagnosis not present

## 2018-09-22 DIAGNOSIS — E782 Mixed hyperlipidemia: Secondary | ICD-10-CM | POA: Diagnosis not present

## 2018-09-22 DIAGNOSIS — I10 Essential (primary) hypertension: Secondary | ICD-10-CM

## 2018-09-22 NOTE — Progress Notes (Signed)
OFFICE NOTE  Chief Complaint:  Follow-up echo  Primary Care Physician: Jackie Plum, MD  HPI:  Carrie Warner is a 72 y.o. female with a past medial history significant for the 2 diabetes, hypertension, dyslipidemia and moderate obesity. Recently she presented to [facility] with symptoms concerning for stroke. She was evaluated and treated for right brain infarct and has some minimal deficits. She's currently on aspirin and Plavix. She was found to have an elevated LDL 126 and started on Lipitor 40 mg daily. She also reports significant fatigue and daytime sleepiness which is worsened since her stroke. Her Epworth sleepiness scale score was 21. She was noted to have episodes of apnea while hospitalized. She denies any chest pain, but reports shortness of breath with exertion and fatigue. This all seems to be somewhat new. She recently moved back to [place] from [place] and is staying with her children. 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 There is extensive intracranial atherosclerosis. She also reports pain in her legs when walking. She feels that her feet are cold and her symptoms improved somewhat with rest.   10/28/2017  Mrs. Hickok returns today for follow-up.  Previously I referred her for stress testing which showed an abnormal finding of anterior ischemia.  She was referred for cardiac catheterization with Dr. Herbie Baltimore and underwent left heart catheterization on September 26, 2017, having been found to have significant stenosis of the LAD.  Unfortunately she suffered a peri-procedural MI and had "no-reflow".  This was complicated by atrial fibrillation.  She spent some time in the ICU and was started on Xarelto in addition to aspirin and Plavix.  Ultimately she was discharged and returns today for follow-up.  She reports being short of breath.  She says this is been somewhat persistent  since her procedure.  Her weight actually is unchanged from her preprocedure weight.  She denies any swelling.  She has no further angina.  EKG shows she is back in sinus rhythm today.  07/01/2018  Mrs. Steinruck was seen today for follow-up.  She denies any more anginal symptoms.  Unfortunately she has had recent falls and was seen in the hospital for this.  She did have stroke after her procedure to open the LAD with no reflow.  Unfortunately she did not have a subsequent echo after cath.  She reports shortness of breath with exertion and has had some lower extremity edema.  Blood pressures well controlled today.  She is on low-dose Lasix 20 mg.  Recent lab work shows poorly controlled diabetes with hemoglobin A1c of 12.8.  LDL 126.  This is on high intensity statin.  She reports difficulty affording her medications.  She says she is getting Xarelto samples in order to take her medication.  Currently she is taking aspirin, Plavix and Xarelto although I previously recommended only Plavix and Xarelto.   09/22/2018  Mrs. Brensinger is seen today in follow-up.  She denies any chest pain or worsening shortness of breath.  We performed an echo back in August which showed normal systolic function and mild diastolic dysfunction.  It was not thought to be the cause of her shortness of breath.  Labs indicated a normal BNP and she ready is on diuretics.  She says her breathing has improved.  Her blood sugar was poorly controlled at the time.  In addition I added ezetimibe for better cholesterol control.  PMHx:  Past Medical History:  Diagnosis Date  . Coronary artery disease involving native coronary artery of native heart with angina pectoris (HCC) 09/26/2017   Cath for Abnormal Myoivew - Anterior Ischemia: Mid LAD-2 lesion, 90 %stenosed. After atherectomyand PTCA, there was no residual stenosis. Post intervention, there is a 0% residual stenosis. Mid LAD-1 lesion, 70 %stenosed. THis was treated with atherectomy,  PTCA and stent placement. A STENT SIERRA 3.00 X 18 MM drug eluting stent was successfully placed. Post intervention, there is a 0% residual ste  . Diabetes mellitus without complication (HCC)   . Myocardial infarction involving left anterior descending (LAD) coronary artery (HCC) 09/26/2017   Type 4a MI - post PCI  . PAF (paroxysmal atrial fibrillation) (HCC) 09/25/2017   Noted during LAD PCI  . Presence of drug coated stent in LAD coronary artery: STENT SIERRA 3.00 X 18 MM drug eluting stent 09/26/2017   Mid LAD-2 lesion, 90 %stenosed & Mid LAD-1 lesion, 70 %stenosed. Lesions treated with atherectomy, PTCA and stent placement (in worst segment). A STENT SIERRA 3.00 X 18 MM drug eluting stent was successfully placed.   . Stroke (HCC) 07/2017    Past Surgical History:  Procedure Laterality Date  . CORONARY ATHERECTOMY N/A 09/26/2017   Procedure: CORONARY ATHERECTOMY;  Surgeon: Corky Crafts, MD;  Location: [facility] INVASIVE CV LAB;  Service: Cardiovascular;  Laterality: N/A;  . CORONARY STENT INTERVENTION N/A 09/26/2017   Procedure: CORONARY STENT INTERVENTION;  Surgeon: Corky Crafts, MD;  Location: [facility] INVASIVE CV LAB;  Service: Cardiovascular;  Laterality: N/A;  . LEFT HEART CATH AND CORONARY ANGIOGRAPHY N/A 09/26/2017   Procedure: LEFT HEART CATH AND CORONARY ANGIOGRAPHY;  Surgeon: Corky Crafts, MD;  Location: [facility] INVASIVE CV LAB;  Service: Cardiovascular;  Laterality: N/A;    FAMHx:  Family History  Problem Relation Age of Onset  . Alzheimer's disease Mother   . Heart disease Father   . Alcohol abuse Sister   . Stroke Brother     SOCHx:   reports that she has never smoked. She has never used smokeless tobacco. She reports that she does not drink alcohol or use drugs.  ALLERGIES:  Allergies  Allergen Reactions  . Bee Venom Anaphylaxis    ROS: Pertinent items noted in HPI and remainder of comprehensive ROS otherwise negative.  HOME MEDS: Current Outpatient  Medications on File Prior to Visit  Medication Sig Dispense Refill  . atorvastatin (LIPITOR) 40 MG tablet Take 1 tablet (40 mg total) by mouth daily at 6 PM. 90 tablet 3  . cholecalciferol (VITAMIN D) 1000 units tablet Take 1,000 Units by mouth daily.    . clopidogrel (PLAVIX) 75 MG tablet Take 1 tablet (75 mg total) by mouth daily. 90 tablet 3  . ezetimibe (ZETIA) 10 MG tablet Take 1 tablet (10 mg total) by mouth daily. 90 tablet 3  . furosemide (LASIX) 20 MG tablet Take 1 tablet (20 mg total) by mouth daily. 90 tablet 3  . Insulin Glargine (BASAGLAR KWIKPEN) 100 UNIT/ML SOPN Inject 0.45 mLs (45 Units total) into the skin 2 (two) times daily. 10 pen 1  . losartan (COZAAR) 50 MG tablet Take 1 tablet (50 mg total) by mouth daily. 90 tablet 3  . metFORMIN (GLUCOPHAGE) 1000 MG tablet Take 1,000 mg by mouth 2 (two) times daily with a meal.    . metoprolol tartrate (LOPRESSOR) 25 MG tablet Take 1 tablet (25 mg total) by mouth 2 (two) times daily. 180 tablet 3  . mirtazapine (REMERON) 15 MG tablet Take  1 tablet by mouth daily.    Marland Kitchen aspirin EC 81 MG tablet Take 81 mg by mouth daily.    . Biotin 1000 MCG tablet Take 1,000 mcg by mouth daily.    . nitroGLYCERIN (NITROSTAT) 0.4 MG SL tablet Place 1 tablet (0.4 mg total) under the tongue every 5 (five) minutes as needed. (Patient not taking: Reported on 09/22/2018) 25 tablet 2  . Rivaroxaban (XARELTO) 15 MG TABS tablet Take 15 mg by mouth daily with supper.    . traMADol (ULTRAM) 50 MG tablet Take 1 tablet (50 mg total) by mouth every 6 (six) hours as needed for severe pain. (Patient not taking: Reported on 09/22/2018) 15 tablet 0   No current facility-administered medications on file prior to visit.     LABS/IMAGING: No results found for this or any previous visit (from the past 48 hour(s)). No results found.  LIPID PANEL:    Component Value Date/Time   CHOL 182 07/21/2017 0329   TRIG 119 07/21/2017 0329   HDL 32 (L) 07/21/2017 0329   CHOLHDL  5.7 07/21/2017 0329   VLDL 24 07/21/2017 0329   LDLCALC 126 (H) 07/21/2017 0329     WEIGHTS: Wt Readings from Last 3 Encounters:  09/22/18 184 lb (83.5 kg)  07/01/18 189 lb (85.7 kg)  06/01/18 189 lb (85.7 kg)    VITALS: BP 122/74   Pulse 67   Ht 5\' 2"  (1.575 m)   Wt 184 lb (83.5 kg)   SpO2 98%   BMI 33.65 kg/m   EXAM: General appearance: alert and no distress Neck: JVD - 3 cm above sternal notch, no carotid bruit and thyroid not enlarged, symmetric, no tenderness/mass/nodules Lungs: diminished breath sounds bibasilar Heart: regular rate and rhythm Abdomen: soft, non-tender; bowel sounds normal; no masses,  no organomegaly and Mildly obese Extremities: extremities normal, atraumatic, no cyanosis or edema Pulses: Decreased pedal pulses Skin: Skin color, texture, turgor normal. No rashes or lesions Neurologic: Grossly normal Psych: Pleasant  EKG: Deferred  ASSESSMENT: 1. CAD status post DES to the LAD-complicated by no reflow, (09/26/2017) 2. Paroxysmal atrial fibrillation- CHADSVASC score of 5 (on Xarelto) 3. Recent stroke with extensive atherosclerosis and intracranial atherosclerosis 4. Type 2 diabetes 5. Hypertension 6. Dyslipidemia 7. Nonrestorative sleep and daytime fatigue 8. Leg pain and cool feet 9. DOE  PLAN: 1.   Mrs. Odonnel has had resolution of her shortness of breath.  Her echo was reassuring with only mild diastolic dysfunction normal systolic function.  She had poorly controlled blood sugars which need to be optimized.  Her cholesterol is also not at goal and I added ezetimibe.  We will repeat a lipid profile with goal LDL less than 70.  Follow-up with APP in 6 months.  Chrystie Nose, MD, [facility], FACP    [facility]  Medical Director of the [facility] Lipid [facility]  Cardiovascular Risk Reduction Clinic Attending Cardiologist  Direct Dial: [phone]  Fax: [phone]  Website:  [email or web address]  Maliki Gignac 09/22/2018, 8:46 AM

## 2018-09-22 NOTE — Patient Instructions (Signed)
Medication Instructions:  Continue current medications If you need a refill on your cardiac medications before your next appointment, please call your pharmacy.   Lab work: FASTING lab work to check cholesterol If you have labs (blood work) drawn today and your tests are completely normal, you will receive your results only by: Marland Kitchen MyChart Message (if you have MyChart) OR . A paper copy in the mail If you have any lab test that is abnormal or we need to change your treatment, we will call you to review the results.  Follow-Up: At [facility], you and your health needs are our priority.  As part of our continuing mission to provide you with exceptional heart care, we have created designated Provider Care Teams.  These Care Teams include your primary Cardiologist (physician) and Advanced Practice Providers (APPs -  Physician Assistants and Nurse Practitioners) who all work together to provide you with the care you need, when you need it. You will need a follow up appointment in 6 months.  Please call our office 2 months in advance to schedule this appointment.  You may see one of the following Advanced Practice Providers on your designated Care Team: Azalee Course, [place] . Micah Flesher, PA-C  Any Other Special Instructions Will Be Listed Below (If Applicable).

## 2018-09-27 ENCOUNTER — Observation Stay (HOSPITAL_COMMUNITY)
Admission: EM | Admit: 2018-09-27 | Discharge: 2018-09-29 | Disposition: A | Discharge status: Home / Self Care | Payer: Medicare HMO | Attending: Internal Medicine | Admitting: Internal Medicine

## 2018-09-27 ENCOUNTER — Emergency Department (HOSPITAL_COMMUNITY): Payer: Medicare HMO

## 2018-09-27 ENCOUNTER — Encounter (HOSPITAL_COMMUNITY): Payer: Self-pay

## 2018-09-27 ENCOUNTER — Other Ambulatory Visit: Payer: Self-pay

## 2018-09-27 DIAGNOSIS — R55 Syncope and collapse: Secondary | ICD-10-CM | POA: Diagnosis not present

## 2018-09-27 DIAGNOSIS — Z7902 Long term (current) use of antithrombotics/antiplatelets: Secondary | ICD-10-CM | POA: Insufficient documentation

## 2018-09-27 DIAGNOSIS — I480 Paroxysmal atrial fibrillation: Secondary | ICD-10-CM | POA: Insufficient documentation

## 2018-09-27 DIAGNOSIS — I2583 Coronary atherosclerosis due to lipid rich plaque: Secondary | ICD-10-CM | POA: Diagnosis not present

## 2018-09-27 DIAGNOSIS — Z794 Long term (current) use of insulin: Secondary | ICD-10-CM | POA: Insufficient documentation

## 2018-09-27 DIAGNOSIS — Z8673 Personal history of transient ischemic attack (TIA), and cerebral infarction without residual deficits: Secondary | ICD-10-CM | POA: Insufficient documentation

## 2018-09-27 DIAGNOSIS — W07XXXA Fall from chair, initial encounter: Secondary | ICD-10-CM | POA: Insufficient documentation

## 2018-09-27 DIAGNOSIS — Z79899 Other long term (current) drug therapy: Secondary | ICD-10-CM | POA: Insufficient documentation

## 2018-09-27 DIAGNOSIS — E876 Hypokalemia: Secondary | ICD-10-CM | POA: Insufficient documentation

## 2018-09-27 DIAGNOSIS — I252 Old myocardial infarction: Secondary | ICD-10-CM | POA: Insufficient documentation

## 2018-09-27 DIAGNOSIS — Z955 Presence of coronary angioplasty implant and graft: Secondary | ICD-10-CM | POA: Diagnosis not present

## 2018-09-27 DIAGNOSIS — I25119 Atherosclerotic heart disease of native coronary artery with unspecified angina pectoris: Secondary | ICD-10-CM | POA: Diagnosis not present

## 2018-09-27 DIAGNOSIS — Z87892 Personal history of anaphylaxis: Secondary | ICD-10-CM | POA: Insufficient documentation

## 2018-09-27 DIAGNOSIS — D649 Anemia, unspecified: Secondary | ICD-10-CM | POA: Diagnosis not present

## 2018-09-27 DIAGNOSIS — Z7982 Long term (current) use of aspirin: Secondary | ICD-10-CM | POA: Diagnosis not present

## 2018-09-27 DIAGNOSIS — Z9181 History of falling: Secondary | ICD-10-CM | POA: Diagnosis not present

## 2018-09-27 DIAGNOSIS — Z8249 Family history of ischemic heart disease and other diseases of the circulatory system: Secondary | ICD-10-CM | POA: Insufficient documentation

## 2018-09-27 DIAGNOSIS — I10 Essential (primary) hypertension: Secondary | ICD-10-CM | POA: Insufficient documentation

## 2018-09-27 DIAGNOSIS — E782 Mixed hyperlipidemia: Secondary | ICD-10-CM | POA: Diagnosis not present

## 2018-09-27 DIAGNOSIS — E118 Type 2 diabetes mellitus with unspecified complications: Secondary | ICD-10-CM | POA: Diagnosis present

## 2018-09-27 DIAGNOSIS — Z91030 Bee allergy status: Secondary | ICD-10-CM | POA: Insufficient documentation

## 2018-09-27 DIAGNOSIS — E11649 Type 2 diabetes mellitus with hypoglycemia without coma: Secondary | ICD-10-CM | POA: Insufficient documentation

## 2018-09-27 DIAGNOSIS — Z7901 Long term (current) use of anticoagulants: Secondary | ICD-10-CM | POA: Insufficient documentation

## 2018-09-27 DIAGNOSIS — I2510 Atherosclerotic heart disease of native coronary artery without angina pectoris: Secondary | ICD-10-CM | POA: Insufficient documentation

## 2018-09-27 LAB — COMPREHENSIVE METABOLIC PANEL
ALBUMIN: 3.5 g/dL (ref 3.5–5.0)
ALK PHOS: 89 U/L (ref 38–126)
ALT: 16 U/L (ref 0–44)
ANION GAP: 7 (ref 5–15)
AST: 25 U/L (ref 15–41)
BUN: 9 mg/dL (ref 8–23)
CALCIUM: 9.2 mg/dL (ref 8.9–10.3)
CHLORIDE: 106 mmol/L (ref 98–111)
CO2: 29 mmol/L (ref 22–32)
Creatinine, Ser: 0.91 mg/dL (ref 0.44–1.00)
GFR calc Af Amer: >60 mL/min (ref >60)
GFR calc non Af Amer: >60 mL/min (ref >60)
GLUCOSE: 107 mg/dL — AB (ref 70–99)
Potassium: 3.8 mmol/L (ref 3.5–5.1)
SODIUM: 142 mmol/L (ref 135–145)
Total Bilirubin: 0.5 mg/dL (ref 0.3–1.2)
Total Protein: 7.4 g/dL (ref 6.5–8.1)

## 2018-09-27 LAB — CBC
HCT: 39.4 % (ref 36.0–46.0)
HEMOGLOBIN: 11.9 g/dL — AB (ref 12.0–15.0)
MCH: 29.8 pg (ref 26.0–34.0)
MCHC: 30.2 g/dL (ref 30.0–36.0)
MCV: 98.5 fL (ref 80.0–100.0)
Platelets: 223 10*3/uL (ref 150–400)
RBC: 4 MIL/uL (ref 3.87–5.11)
RDW: 13.2 % (ref 11.5–15.5)
WBC: 7.1 10*3/uL (ref 4.0–10.5)
nRBC: 0 % (ref 0.0–0.2)

## 2018-09-27 LAB — URINALYSIS, ROUTINE W REFLEX MICROSCOPIC
Bilirubin Urine: NEGATIVE
Glucose, UA: NEGATIVE mg/dL
Hgb urine dipstick: NEGATIVE
KETONES UR: NEGATIVE mg/dL
LEUKOCYTES UA: NEGATIVE
Nitrite: NEGATIVE
PH: 6 (ref 5.0–8.0)
Protein, ur: NEGATIVE mg/dL
SPECIFIC GRAVITY, URINE: 1.012 (ref 1.005–1.030)

## 2018-09-27 LAB — TROPONIN I: Troponin I: <0.03 ng/mL (ref <0.03)

## 2018-09-27 NOTE — ED Triage Notes (Signed)
Pt reports a headache over the last 3 days. Denies injury. Daughter states that she has been having headaches for over a year since her stroke. Denies vomiting, but states that she often gets lightheaded. A&Ox4, but seems lethargic.

## 2018-09-27 NOTE — ED Notes (Signed)
Patient transported to CT 

## 2018-09-27 NOTE — ED Notes (Signed)
ED Provider at bedside. 

## 2018-09-27 NOTE — H&P (Signed)
History and Physical   [facility] Admission History and Physical [employer], D.O.    Patient Name: Carrie Warner MR#: [id number] Date of Birth: 1946/11/10 Date of Admission: 09/27/2018  Referring MD/NP/PA: Dr. Rosalia Hammers Primary Care Physician: Jackie Plum, MD  Chief Complaint:  Chief Complaint  Patient presents with  . Headache    HPI: Carrie Warner is a 72 y.o. female with a known history of diabetes, paroxysmal atrial fibrillation, coronary artery disease, CVA about 1 year ago presents to the emergency department for evaluation of syncope.  Patient was in a usual state of health until she sustained a syncopal event while standing at church.  She did hit her head although she was eased to the floor by bystanders.  Patient does not recall details surrounding the event however there is no report of any seizure-like activity, no preceding symptoms were noticed except that the family does report some change in her speech and gait instability recently.  Daughter who lives with her also notes worsening confusion, decreased p.o. intake and decreased appetite, states that she has seen a gradual decline since her stroke about 1 year ago.  Patient is a poor historian but she reports that she feels weak.  Patient denies fevers/chills,  dizziness, chest pain, shortness of breath, N/V/C/D, abdominal pain, dysuria/frequency, changes in mental status.    Otherwise there has been no change in status. Patient has been taking medication as prescribed and there has been no recent change in medication or diet.  No recent antibiotics.  There has been no recent illness, hospitalizations, travel or sick contacts.    EMS/ED Course: Medical admission has been requested for further management of syncope.  Review of Systems:  CONSTITUTIONAL: No fever/chills, fatigue,weight gain/loss, headache. EYES: No blurry or double vision. ENT: No tinnitus, postnasal drip,  redness or soreness of the oropharynx. RESPIRATORY: No cough, dyspnea, wheeze.  No hemoptysis.  CARDIOVASCULAR: No chest pain, palpitations, syncope, orthopnea. No lower extremity edema.  GASTROINTESTINAL: No nausea, vomiting, abdominal pain, diarrhea, constipation.  No hematemesis, melena or hematochezia. GENITOURINARY: No dysuria, frequency, hematuria. ENDOCRINE: No polyuria or nocturia. No heat or cold intolerance. HEMATOLOGY: No anemia, bruising, bleeding. INTEGUMENTARY: No rashes, ulcers, lesions. MUSCULOSKELETAL: No arthritis, gout NEUROLOGIC: Positive loss of consciousness or weakness, confusion.  No numbness, tingling, ataxia, seizure-type activity,  PSYCHIATRIC: No anxiety, depression, insomnia.   Past Medical History:  Diagnosis Date  . Coronary artery disease involving native coronary artery of native heart with angina pectoris (HCC) 09/26/2017   Cath for Abnormal Myoivew - Anterior Ischemia: Mid LAD-2 lesion, 90 %stenosed. After atherectomyand PTCA, there was no residual stenosis. Post intervention, there is a 0% residual stenosis. Mid LAD-1 lesion, 70 %stenosed. THis was treated with atherectomy, PTCA and stent placement. A STENT SIERRA 3.00 X 18 MM drug eluting stent was successfully placed. Post intervention, there is a 0% residual ste  . Diabetes mellitus without complication (HCC)   . Myocardial infarction involving left anterior descending (LAD) coronary artery (HCC) 09/26/2017   Type 4a MI - post PCI  . PAF (paroxysmal atrial fibrillation) (HCC) 09/25/2017   Noted during LAD PCI  . Presence of drug coated stent in LAD coronary artery: STENT SIERRA 3.00 X 18 MM drug eluting stent 09/26/2017   Mid LAD-2 lesion, 90 %stenosed & Mid LAD-1 lesion, 70 %stenosed. Lesions treated with atherectomy, PTCA and stent placement (in worst segment). A STENT SIERRA 3.00 X 18 MM drug eluting stent was successfully placed.   Marland Kitchen  Stroke [facility]) 07/2017    Past Surgical History:  Procedure  Laterality Date  . CORONARY ATHERECTOMY N/A 09/26/2017   Procedure: CORONARY ATHERECTOMY;  Surgeon: Corky Crafts, MD;  Location: [facility] INVASIVE CV LAB;  Service: Cardiovascular;  Laterality: N/A;  . CORONARY STENT INTERVENTION N/A 09/26/2017   Procedure: CORONARY STENT INTERVENTION;  Surgeon: Corky Crafts, MD;  Location: [facility] INVASIVE CV LAB;  Service: Cardiovascular;  Laterality: N/A;  . LEFT HEART CATH AND CORONARY ANGIOGRAPHY N/A 09/26/2017   Procedure: LEFT HEART CATH AND CORONARY ANGIOGRAPHY;  Surgeon: Corky Crafts, MD;  Location: [facility] INVASIVE CV LAB;  Service: Cardiovascular;  Laterality: N/A;     reports that she has never smoked. She has never used smokeless tobacco. She reports that she does not drink alcohol or use drugs.  Allergies  Allergen Reactions  . Bee Venom Anaphylaxis    Family History  Problem Relation Age of Onset  . Alzheimer's disease Mother   . Heart disease Father   . Alcohol abuse Sister   . Stroke Brother     Prior to Admission medications   Medication Sig Start Date End Date Taking? Authorizing Provider  atorvastatin (LIPITOR) 40 MG tablet Take 1 tablet (40 mg total) by mouth daily at 6 PM. 08/05/18  Yes Hilty, Lisette Abu, MD  cholecalciferol (VITAMIN D) 1000 units tablet Take 1,000 Units by mouth daily.   Yes [provider]  clopidogrel (PLAVIX) 75 MG tablet Take 1 tablet (75 mg total) by mouth daily. 07/02/18  Yes Hilty, Lisette Abu, MD  ezetimibe (ZETIA) 10 MG tablet Take 1 tablet (10 mg total) by mouth daily. 07/01/18 09/29/18 Yes Hilty, Lisette Abu, MD  furosemide (LASIX) 20 MG tablet Take 1 tablet (20 mg total) by mouth daily. 07/02/18 09/30/18 Yes Hilty, Lisette Abu, MD  Insulin Glargine (BASAGLAR KWIKPEN) 100 UNIT/ML SOPN Inject 0.45 mLs (45 Units total) into the skin 2 (two) times daily. 09/09/18  Yes Linwood Dibbles, MD  losartan (COZAAR) 50 MG tablet Take 1 tablet (50 mg total) by mouth daily. 07/02/18  Yes Hilty, Lisette Abu, MD   metFORMIN (GLUCOPHAGE) 1000 MG tablet Take 1,000 mg by mouth 2 (two) times daily with a meal.   Yes [provider]  mirtazapine (REMERON) 15 MG tablet Take 1 tablet by mouth at bedtime.  06/17/18  Yes [provider]  aspirin EC 81 MG tablet Take 81 mg by mouth daily.    [provider]  Biotin 1000 MCG tablet Take 1,000 mcg by mouth daily.    [provider]  metoprolol tartrate (LOPRESSOR) 25 MG tablet Take 1 tablet (25 mg total) by mouth 2 (two) times daily. 08/05/18   Hilty, Lisette Abu, MD  nitroGLYCERIN (NITROSTAT) 0.4 MG SL tablet Place 1 tablet (0.4 mg total) under the tongue every 5 (five) minutes as needed. Patient taking differently: Place 0.4 mg under the tongue every 5 (five) minutes as needed. Chest pain 09/30/17   Laverda Page B, NP  traMADol (ULTRAM) 50 MG tablet Take 1 tablet (50 mg total) by mouth every 6 (six) hours as needed for severe pain. Patient taking differently: Take 50 mg by mouth every 6 (six) hours as needed for severe pain. pain 06/01/18   Loren Racer, MD    Physical Exam: Vitals:   09/27/18 2035 09/27/18 2132  BP: (!) 161/98   Pulse: 71   Resp: 16   Temp: 98.2 F (36.8 C) 98.5 F (36.9 C)  TempSrc: Oral Rectal  SpO2: 95%  GENERAL: 72 y.o.-year-old female patient, well-developed, well-nourished lying in the bed in no acute distress.  Pleasant and cooperative.   HEENT: Head atraumatic, normocephalic. Pupils equal. Mucus membranes moist. NECK: Supple. No JVD. CHEST: Normal breath sounds bilaterally. No wheezing, rales, rhonchi or crackles. No use of accessory muscles of respiration.  No reproducible chest wall tenderness.  CARDIOVASCULAR: S1, S2 normal. No murmurs, rubs, or gallops. Cap refill <2 seconds. Pulses intact distally.  ABDOMEN: Soft, nondistended, nontender. No rebound, guarding, rigidity. Normoactive bowel sounds present in all four quadrants.  EXTREMITIES: No pedal edema, cyanosis, or clubbing. No  calf tenderness or Homan's sign.  NEUROLOGIC: The patient is alert and oriented x 3. Cranial nerves II through XII are grossly intact with no focal sensorimotor deficit. PSYCHIATRIC:  Normal affect, mood, thought content. SKIN: Warm, dry, and intact without obvious rash, lesion, or ulcer.    Labs on Admission:  CBC: Recent Labs  Lab 09/27/18 2142  WBC 7.1  HGB 11.9*  HCT 39.4  MCV 98.5  PLT 223   Basic Metabolic Panel: Recent Labs  Lab 09/27/18 2142  NA 142  K 3.8  CL 106  CO2 29  GLUCOSE 107*  BUN 9  CREATININE 0.91  CALCIUM 9.2   GFR: Estimated Creatinine Clearance: 55.2 mL/min (by C-G formula based on SCr of 0.91 mg/dL). Liver Function Tests: Recent Labs  Lab 09/27/18 2142  AST 25  ALT 16  ALKPHOS 89  BILITOT 0.5  PROT 7.4  ALBUMIN 3.5   No results for input(s): LIPASE, AMYLASE in the last 168 hours. No results for input(s): AMMONIA in the last 168 hours. Coagulation Profile: No results for input(s): INR, PROTIME in the last 168 hours. Cardiac Enzymes: Recent Labs  Lab 09/27/18 2142  TROPONINI <0.03   BNP (last 3 results) Recent Labs    10/28/17 1401 07/01/18 1145  PROBNP 429* 144   HbA1C: No results for input(s): HGBA1C in the last 72 hours. CBG: No results for input(s): GLUCAP in the last 168 hours. Lipid Profile: No results for input(s): CHOL, HDL, LDLCALC, TRIG, CHOLHDL, LDLDIRECT in the last 72 hours. Thyroid Function Tests: No results for input(s): TSH, T4TOTAL, FREET4, T3FREE, THYROIDAB in the last 72 hours. Anemia Panel: No results for input(s): VITAMINB12, FOLATE, FERRITIN, TIBC, IRON, RETICCTPCT in the last 72 hours. Urine analysis:    Component Value Date/Time   COLORURINE YELLOW 09/27/2018 2142   APPEARANCEUR CLEAR 09/27/2018 2142   LABSPEC 1.012 09/27/2018 2142   PHURINE 6.0 09/27/2018 2142   GLUCOSEU NEGATIVE 09/27/2018 2142   HGBUR NEGATIVE 09/27/2018 2142   BILIRUBINUR NEGATIVE 09/27/2018 2142   KETONESUR NEGATIVE  09/27/2018 2142   PROTEINUR NEGATIVE 09/27/2018 2142   NITRITE NEGATIVE 09/27/2018 2142   LEUKOCYTESUR NEGATIVE 09/27/2018 2142   Sepsis Labs: @LABRCNTIP (procalcitonin:4,lacticidven:4) )No results found for this or any previous visit (from the past 240 hour(s)).   Radiological Exams on Admission: Ct Head Wo Contrast  Result Date: 09/27/2018 CLINICAL DATA:  Altered level of consciousness.  Headache. EXAM: CT HEAD WITHOUT CONTRAST TECHNIQUE: Contiguous axial images were obtained from the base of the skull through the vertex without intravenous contrast. COMPARISON:  09/09/2018 FINDINGS: Brain: Mild chronic small vessel disease throughout the deep white matter. No acute intracranial abnormality. Specifically, no hemorrhage, hydrocephalus, mass lesion, acute infarction, or significant intracranial injury. Vascular: No hyperdense vessel or unexpected calcification. Skull: No acute calvarial abnormality. Sinuses/Orbits: Visualized paranasal sinuses and mastoids clear. Orbital soft tissues unremarkable. Other: None IMPRESSION: Mild chronic small vessel disease throughout  the deep white matter. No acute intracranial abnormality. Electronically Signed   By: Charlett Nose M.D.   On: 09/27/2018 21:28   Dg Chest Port 1 View  Result Date: 09/27/2018 CLINICAL DATA:  Weakness. EXAM: PORTABLE CHEST 1 VIEW COMPARISON:  Radiographs of September 09, 2018. FINDINGS: The heart size and mediastinal contours are within normal limits. Both lungs are clear. The visualized skeletal structures are unremarkable. IMPRESSION: No active disease. Electronically Signed   By: Lupita Raider, M.D.   On: 09/27/2018 21:15    EKG: Normal sinus rhythm at 69 bpm with normal axis and nonspecific ST-T wave changes.   Assessment/Plan  This is a 72 y.o. female with a history of diabetes, paroxysmal atrial fibrillation, coronary artery disease, CVA about 1 year ago now being admitted with:  #. Syncope - Admit observation with  telemetry monitoring - IV fluid hydration - Check orthostatics - Check echo, carotids -We will order MRI given history of CVA - Trend trops, check TSH, lipids - Consider cardio consult  #.  History of coronary artery disease Continue aspirin, Lipitor, Zetia, Plavix, nitroglycerin as needed  #.  History of hypertension Continue Lasix, Cozaar, metoprolol  #. H/o Diabetes - Accuchecks achs with RISS coverage - Heart healthy, carb controlled diet  Admission status: Observation, telemetry IV Fluids: Normal saline Diet/Nutrition: Heart healthy, carb controlled DVT Px: Lovenox, SCDs and early ambulation. Code Status: Full Code  Disposition Plan: To home in 1-2 days  All the records are reviewed and case discussed with ED provider. Management plans discussed with the patient and/or family who express understanding and agree with plan of care.  Kierstyn Baranowski D.O. on 09/27/2018 at 11:27 PM CC: Primary care physician; Jackie Plum, MD   09/27/2018, 11:27 PM

## 2018-09-27 NOTE — ED Provider Notes (Signed)
[facility]-EMERGENCY DEPT Provider Note   CSN: [id number] Arrival date & time: 09/27/18  2017     History   Chief Complaint Chief Complaint  Patient presents with  . Headache    HPI Carrie Warner is a 72 y.o. female. 5 caveat patient is poor historian HPI  5 female who presents today with family after syncopal episode at church.  They report that she was standing at church when she had a syncopal episode and was eased to the floor by bystanders.  She was unresponsive and was placed in a chair.  She fell out of the chair and had decreased responsiveness for several minutes.  They report no evidence of seizure activity.  Did not think that her blood sugars level was not checked at that time.  She has had a stroke by family report and has had some intermittent ringing of speech recently.  She has remained somewhat less alert than usual but no definite lateralized deficits have been noted here.  Mother reports no medication changes.  They have noticed worsening balance problems.  She fell several weeks ago and was seen and evaluated in the ED at that time.   Past Medical History:  Diagnosis Date  . Coronary artery disease involving native coronary artery of native heart with angina pectoris (HCC) 09/26/2017   Cath for Abnormal Myoivew - Anterior Ischemia: Mid LAD-2 lesion, 90 %stenosed. After atherectomyand PTCA, there was no residual stenosis. Post intervention, there is a 0% residual stenosis. Mid LAD-1 lesion, 70 %stenosed. THis was treated with atherectomy, PTCA and stent placement. A STENT SIERRA 3.00 X 18 MM drug eluting stent was successfully placed. Post intervention, there is a 0% residual ste  . Diabetes mellitus without complication (HCC)   . Myocardial infarction involving left anterior descending (LAD) coronary artery (HCC) 09/26/2017   Type 4a MI - post PCI  . PAF (paroxysmal atrial fibrillation) (HCC) 09/25/2017   Noted during LAD PCI  . Presence of  drug coated stent in LAD coronary artery: STENT SIERRA 3.00 X 18 MM drug eluting stent 09/26/2017   Mid LAD-2 lesion, 90 %stenosed & Mid LAD-1 lesion, 70 %stenosed. Lesions treated with atherectomy, PTCA and stent placement (in worst segment). A STENT SIERRA 3.00 X 18 MM drug eluting stent was successfully placed.   . Stroke [facility]) 07/2017    Patient Active Problem List   Diagnosis Date Noted  . Coronary artery disease due to lipid rich plaque 10/28/2017  . Myocardial infarction involving left anterior descending (LAD) coronary artery (HCC) - TYPE 4a periprocedural MI 09/29/2017  . DOE (dyspnea on exertion) 09/26/2017  . Abnormal nuclear stress test 09/26/2017  . New onset atrial fibrillation (HCC) 09/26/2017  . Coronary artery disease involving native coronary artery of native heart with angina pectoris (HCC) 09/26/2017  . Presence of drug coated stent in LAD coronary artery: STENT SIERRA 3.00 X 18 MM drug eluting stent 09/26/2017  . Gait abnormality 09/11/2017  . Excessive daytime sleepiness 09/01/2017  . Cold foot 09/01/2017  . Witnessed apneic spells 09/01/2017  . Shortness of breath 09/01/2017  . Mixed hyperlipidemia   . CVA (cerebral vascular accident) (HCC) 07/20/2017  . Orthostatic hypotension 07/19/2017  . Essential hypertension 07/19/2017  . Type 2 diabetes mellitus with complication (HCC) 07/19/2017    Past Surgical History:  Procedure Laterality Date  . CORONARY ATHERECTOMY N/A 09/26/2017   Procedure: CORONARY ATHERECTOMY;  Surgeon: Corky Crafts, MD;  Location: [facility] INVASIVE CV LAB;  Service: Cardiovascular;  Laterality: N/A;  . CORONARY STENT INTERVENTION N/A 09/26/2017   Procedure: CORONARY STENT INTERVENTION;  Surgeon: Corky Crafts, MD;  Location: [facility] INVASIVE CV LAB;  Service: Cardiovascular;  Laterality: N/A;  . LEFT HEART CATH AND CORONARY ANGIOGRAPHY N/A 09/26/2017   Procedure: LEFT HEART CATH AND CORONARY ANGIOGRAPHY;  Surgeon: Corky Crafts,  MD;  Location: [facility] INVASIVE CV LAB;  Service: Cardiovascular;  Laterality: N/A;     OB History   None      Home Medications    Prior to Admission medications   Medication Sig Start Date End Date Taking? Authorizing Provider  aspirin EC 81 MG tablet Take 81 mg by mouth daily.    [provider]  atorvastatin (LIPITOR) 40 MG tablet Take 1 tablet (40 mg total) by mouth daily at 6 PM. 08/05/18   Hilty, Lisette Abu, MD  Biotin 1000 MCG tablet Take 1,000 mcg by mouth daily.    [provider]  cholecalciferol (VITAMIN D) 1000 units tablet Take 1,000 Units by mouth daily.    [provider]  clopidogrel (PLAVIX) 75 MG tablet Take 1 tablet (75 mg total) by mouth daily. 07/02/18   Hilty, Lisette Abu, MD  ezetimibe (ZETIA) 10 MG tablet Take 1 tablet (10 mg total) by mouth daily. 07/01/18 09/29/18  Chrystie Nose, MD  furosemide (LASIX) 20 MG tablet Take 1 tablet (20 mg total) by mouth daily. 07/02/18 09/30/18  Chrystie Nose, MD  Insulin Glargine (BASAGLAR KWIKPEN) 100 UNIT/ML SOPN Inject 0.45 mLs (45 Units total) into the skin 2 (two) times daily. 09/09/18   Linwood Dibbles, MD  losartan (COZAAR) 50 MG tablet Take 1 tablet (50 mg total) by mouth daily. 07/02/18   Hilty, Lisette Abu, MD  metFORMIN (GLUCOPHAGE) 1000 MG tablet Take 1,000 mg by mouth 2 (two) times daily with a meal.    [provider]  metoprolol tartrate (LOPRESSOR) 25 MG tablet Take 1 tablet (25 mg total) by mouth 2 (two) times daily. 08/05/18   Hilty, Lisette Abu, MD  mirtazapine (REMERON) 15 MG tablet Take 1 tablet by mouth daily. 06/17/18   [provider]  nitroGLYCERIN (NITROSTAT) 0.4 MG SL tablet Place 1 tablet (0.4 mg total) under the tongue every 5 (five) minutes as needed. Patient not taking: Reported on 09/22/2018 09/30/17   Arty Baumgartner, NP  Rivaroxaban (XARELTO) 15 MG TABS tablet Take 15 mg by mouth daily with supper.    [provider]  traMADol (ULTRAM) 50 MG tablet Take 1  tablet (50 mg total) by mouth every 6 (six) hours as needed for severe pain. Patient not taking: Reported on 09/22/2018 06/01/18   Loren Racer, MD    Family History Family History  Problem Relation Age of Onset  . Alzheimer's disease Mother   . Heart disease Father   . Alcohol abuse Sister   . Stroke Brother     Social History Social History   Tobacco Use  . Smoking status: Never Smoker  . Smokeless tobacco: Never Used  Substance Use Topics  . Alcohol use: No  . Drug use: No     Allergies   Bee venom   Review of Systems Review of Systems  All other systems reviewed and are negative.    Physical Exam Updated Vital Signs BP (!) 161/98 (BP Location: Left Arm)   Pulse 71   Temp 98.2 F (36.8 C) (Oral)   Resp 16   SpO2 95%   Physical Exam  Constitutional: She appears  well-developed and well-nourished.  HENT:  Head: Normocephalic and atraumatic.  Mouth/Throat: Oropharynx is clear and moist.  Eyes: Pupils are equal, round, and reactive to light. EOM are normal.  Neck: Normal range of motion.  Cardiovascular: Normal rate and regular rhythm.  Pulmonary/Chest: Effort normal and breath sounds normal.  Abdominal: Soft. Bowel sounds are normal.  Musculoskeletal: Normal range of motion.  Neurological: She is alert. She displays normal reflexes. No cranial nerve deficit or sensory deficit. Coordination abnormal.  Right heel to shin abnormal Left heel to shin normal Bilateral finger to nose normal No palmar drift noted Patient with difficulty standing and appears to sway  Skin: Skin is warm. Capillary refill takes less than 2 seconds.  Psychiatric: She has a normal mood and affect.  Nursing note and vitals reviewed.    ED Treatments / Results  Labs (all labs ordered are listed, but only abnormal results are displayed) Labs Reviewed  CBC - Abnormal; Notable for the following components:      Result Value   Hemoglobin 11.9 (*)    All other components within  normal limits  COMPREHENSIVE METABOLIC PANEL - Abnormal; Notable for the following components:   Glucose, Bld 107 (*)    All other components within normal limits  URINALYSIS, ROUTINE W REFLEX MICROSCOPIC  TROPONIN I    EKG EKG Interpretation  Date/Time:  Sunday September 27 2018 21:26:21 EDT Ventricular Rate:  69 PR Interval:    QRS Duration: 91 QT Interval:  410 QTC Calculation: 440 R Axis:   -15 Text Interpretation:  Normal sinus rhythm Non-specific ST-t changes No significant change since last tracing Confirmed by Margarita Grizzle [phone]) on 09/27/2018 10:51:36 PM   Radiology Ct Head Wo Contrast  Result Date: 09/27/2018 CLINICAL DATA:  Altered level of consciousness.  Headache. EXAM: CT HEAD WITHOUT CONTRAST TECHNIQUE: Contiguous axial images were obtained from the base of the skull through the vertex without intravenous contrast. COMPARISON:  09/09/2018 FINDINGS: Brain: Mild chronic small vessel disease throughout the deep white matter. No acute intracranial abnormality. Specifically, no hemorrhage, hydrocephalus, mass lesion, acute infarction, or significant intracranial injury. Vascular: No hyperdense vessel or unexpected calcification. Skull: No acute calvarial abnormality. Sinuses/Orbits: Visualized paranasal sinuses and mastoids clear. Orbital soft tissues unremarkable. 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 No acute intracranial abnormality. Electronically Signed   By: Charlett Nose M.D.   On: 09/27/2018 21:28   Dg Chest Port 1 View  Result Date: 09/27/2018 CLINICAL DATA:  Weakness. EXAM: PORTABLE CHEST 1 VIEW COMPARISON:  Radiographs of September 09, 2018. FINDINGS: The heart size and mediastinal contours are within normal limits. Both lungs are clear. The visualized skeletal structures are unremarkable. IMPRESSION: No active disease. Electronically Signed   By: Lupita Raider, M.D.   On: 09/27/2018 21:15     Procedures Procedures (including critical care time)  Medications Ordered in ED Medications - No data to display   Initial Impression / Assessment and Plan / ED Course  I have reviewed the triage vital signs and the nursing notes.  Pertinent labs & imaging results that were available during my care of the patient were reviewed by me and considered in my medical decision making (see chart for details).    72 year old female with syncope today.  Unclear etiology of syncope.  She has had in the past and has had some increased difficulty walking and poor balance.  Patient is not orthostatic here.  She did have difficulty standing, sitting  up in the bed and walking on my exam, although it is difficult to ascertain if this is different from baseline. Discussed with DR. Hugelmeyer and she will see for admission.   Final Clinical Impressions(s) / ED Diagnoses   Final diagnoses:  Syncope and collapse    ED Discharge Orders    None       Margarita Grizzle, MD 09/27/18 2326

## 2018-09-28 ENCOUNTER — Observation Stay (HOSPITAL_BASED_OUTPATIENT_CLINIC_OR_DEPARTMENT_OTHER): Payer: Medicare HMO

## 2018-09-28 ENCOUNTER — Encounter (HOSPITAL_COMMUNITY): Payer: Self-pay

## 2018-09-28 ENCOUNTER — Observation Stay (HOSPITAL_COMMUNITY): Payer: Medicare HMO

## 2018-09-28 DIAGNOSIS — R55 Syncope and collapse: Secondary | ICD-10-CM

## 2018-09-28 DIAGNOSIS — E782 Mixed hyperlipidemia: Secondary | ICD-10-CM

## 2018-09-28 DIAGNOSIS — D649 Anemia, unspecified: Secondary | ICD-10-CM | POA: Diagnosis not present

## 2018-09-28 DIAGNOSIS — I340 Nonrheumatic mitral (valve) insufficiency: Secondary | ICD-10-CM

## 2018-09-28 DIAGNOSIS — I10 Essential (primary) hypertension: Secondary | ICD-10-CM | POA: Diagnosis not present

## 2018-09-28 LAB — ECHOCARDIOGRAM COMPLETE
HEIGHTINCHES: 62 in
Weight: 2941.82 oz

## 2018-09-28 LAB — CBC
HEMATOCRIT: 35.1 % — AB (ref 36.0–46.0)
HEMOGLOBIN: 10.5 g/dL — AB (ref 12.0–15.0)
MCH: 29.4 pg (ref 26.0–34.0)
MCHC: 29.9 g/dL — AB (ref 30.0–36.0)
MCV: 98.3 fL (ref 80.0–100.0)
Platelets: 205 10*3/uL (ref 150–400)
RBC: 3.57 MIL/uL — ABNORMAL LOW (ref 3.87–5.11)
RDW: 13.2 % (ref 11.5–15.5)
WBC: 7.2 10*3/uL (ref 4.0–10.5)
nRBC: 0 % (ref 0.0–0.2)

## 2018-09-28 LAB — GLUCOSE, CAPILLARY
GLUCOSE-CAPILLARY: 110 mg/dL — AB (ref 70–99)
GLUCOSE-CAPILLARY: 108 mg/dL — AB (ref 70–99)
GLUCOSE-CAPILLARY: 98 mg/dL (ref 70–99)
Glucose-Capillary: 90 mg/dL (ref 70–99)
Glucose-Capillary: 112 mg/dL — ABNORMAL HIGH (ref 70–99)
Glucose-Capillary: 119 mg/dL — ABNORMAL HIGH (ref 70–99)

## 2018-09-28 LAB — BASIC METABOLIC PANEL
Anion gap: 6 (ref 5–15)
BUN: 9 mg/dL (ref 8–23)
CO2: 27 mmol/L (ref 22–32)
Calcium: 8.8 mg/dL — ABNORMAL LOW (ref 8.9–10.3)
Chloride: 108 mmol/L (ref 98–111)
Creatinine, Ser: 0.8 mg/dL (ref 0.44–1.00)
GFR calc Af Amer: >60 mL/min (ref >60)
GFR calc non Af Amer: >60 mL/min (ref >60)
GLUCOSE: 116 mg/dL — AB (ref 70–99)
POTASSIUM: 3.4 mmol/L — AB (ref 3.5–5.1)
Sodium: 141 mmol/L (ref 135–145)

## 2018-09-28 LAB — PHOSPHORUS: Phosphorus: 3.6 mg/dL (ref 2.5–4.6)

## 2018-09-28 LAB — MAGNESIUM: Magnesium: 1.8 mg/dL (ref 1.7–2.4)

## 2018-09-28 MED ORDER — ALUM & MAG HYDROXIDE-SIMETH 200-200-20 MG/5ML PO SUSP: 30.0000 mL | Freq: Four times a day (QID) | ORAL | Status: DC | PRN

## 2018-09-28 MED ORDER — VITAMIN D3 25 MCG (1000 UNIT) PO TABS
1000.0000 [IU] | ORAL_TABLET | Freq: Every day | ORAL | Status: DC
  Administered 2018-09-28 – 2018-09-29 (×2): 1000 [IU] via ORAL
  Filled 2018-09-28 (×2): qty 1

## 2018-09-28 MED ORDER — ACETAMINOPHEN 325 MG PO TABS: 650.0000 mg | ORAL_TABLET | Freq: Four times a day (QID) | ORAL | Status: DC | PRN

## 2018-09-28 MED ORDER — ACETAMINOPHEN 650 MG RE SUPP: 650.0000 mg | Freq: Four times a day (QID) | RECTAL | Status: DC | PRN

## 2018-09-28 MED ORDER — INSULIN ASPART 100 UNIT/ML ~~LOC~~ SOLN: 0.0000 [IU] | Freq: Every day | SUBCUTANEOUS | Status: DC

## 2018-09-28 MED ORDER — INSULIN ASPART 100 UNIT/ML ~~LOC~~ SOLN
0.0000 [IU] | Freq: Three times a day (TID) | SUBCUTANEOUS | Status: DC
  Administered 2018-09-29: 3 [IU] via SUBCUTANEOUS

## 2018-09-28 MED ORDER — ONDANSETRON HCL 4 MG/2ML IJ SOLN: 4.0000 mg | Freq: Four times a day (QID) | INTRAMUSCULAR | Status: DC | PRN

## 2018-09-28 MED ORDER — ASPIRIN EC 81 MG PO TBEC
81.0000 mg | DELAYED_RELEASE_TABLET | Freq: Every day | ORAL | Status: DC
  Administered 2018-09-28 – 2018-09-29 (×2): 81 mg via ORAL
  Filled 2018-09-28 (×2): qty 1

## 2018-09-28 MED ORDER — INSULIN GLARGINE 100 UNIT/ML ~~LOC~~ SOLN
20.0000 [IU] | Freq: Two times a day (BID) | SUBCUTANEOUS | Status: DC
  Administered 2018-09-28 – 2018-09-29 (×3): 20 [IU] via SUBCUTANEOUS
  Filled 2018-09-28 (×4): qty 0.2

## 2018-09-28 MED ORDER — NITROGLYCERIN 0.4 MG SL SUBL: 0.4000 mg | SUBLINGUAL_TABLET | SUBLINGUAL | Status: DC | PRN

## 2018-09-28 MED ORDER — TRAMADOL HCL 50 MG PO TABS: 50.0000 mg | ORAL_TABLET | Freq: Four times a day (QID) | ORAL | Status: DC | PRN

## 2018-09-28 MED ORDER — ENOXAPARIN SODIUM 40 MG/0.4ML ~~LOC~~ SOLN
40.0000 mg | Freq: Every day | SUBCUTANEOUS | Status: DC
  Administered 2018-09-28 – 2018-09-29 (×2): 40 mg via SUBCUTANEOUS
  Filled 2018-09-28 (×2): qty 0.4

## 2018-09-28 MED ORDER — METOPROLOL TARTRATE 25 MG PO TABS
25.0000 mg | ORAL_TABLET | Freq: Two times a day (BID) | ORAL | Status: DC
  Administered 2018-09-28 – 2018-09-29 (×4): 25 mg via ORAL
  Filled 2018-09-28 (×4): qty 1

## 2018-09-28 MED ORDER — ONDANSETRON HCL 4 MG PO TABS: 4.0000 mg | ORAL_TABLET | Freq: Four times a day (QID) | ORAL | Status: DC | PRN

## 2018-09-28 MED ORDER — SODIUM CHLORIDE 0.9% FLUSH
3.0000 mL | Freq: Two times a day (BID) | INTRAVENOUS | Status: DC
  Administered 2018-09-28 (×2): 3 mL via INTRAVENOUS

## 2018-09-28 MED ORDER — BIOTIN 1000 MCG PO TABS: 1000.0000 ug | ORAL_TABLET | Freq: Every day | ORAL | Status: DC

## 2018-09-28 MED ORDER — POTASSIUM CHLORIDE CRYS ER 20 MEQ PO TBCR
40.0000 meq | EXTENDED_RELEASE_TABLET | Freq: Once | ORAL | Status: AC
  Administered 2018-09-28: 40 meq via ORAL
  Filled 2018-09-28: qty 2

## 2018-09-28 MED ORDER — ATORVASTATIN CALCIUM 40 MG PO TABS
40.0000 mg | ORAL_TABLET | Freq: Every day | ORAL | Status: DC
  Administered 2018-09-28: 40 mg via ORAL
  Filled 2018-09-28: qty 1

## 2018-09-28 MED ORDER — EZETIMIBE 10 MG PO TABS
10.0000 mg | ORAL_TABLET | Freq: Every day | ORAL | Status: DC
  Administered 2018-09-28 – 2018-09-29 (×2): 10 mg via ORAL
  Filled 2018-09-28 (×2): qty 1

## 2018-09-28 MED ORDER — CLOPIDOGREL BISULFATE 75 MG PO TABS
75.0000 mg | ORAL_TABLET | Freq: Every day | ORAL | Status: DC
  Administered 2018-09-28 – 2018-09-29 (×2): 75 mg via ORAL
  Filled 2018-09-28 (×2): qty 1

## 2018-09-28 MED ORDER — BISACODYL 5 MG PO TBEC: 5.0000 mg | DELAYED_RELEASE_TABLET | Freq: Every day | ORAL | Status: DC | PRN

## 2018-09-28 MED ORDER — INSULIN GLARGINE 100 UNIT/ML ~~LOC~~ SOLN
45.0000 [IU] | Freq: Two times a day (BID) | SUBCUTANEOUS | Status: DC
  Administered 2018-09-28: 45 [IU] via SUBCUTANEOUS
  Filled 2018-09-28 (×2): qty 0.45

## 2018-09-28 MED ORDER — SODIUM CHLORIDE 0.9 % IV SOLN
INTRAVENOUS | Status: DC
  Administered 2018-09-28 – 2018-09-29 (×3): via INTRAVENOUS

## 2018-09-28 MED ORDER — SENNOSIDES-DOCUSATE SODIUM 8.6-50 MG PO TABS: 1.0000 | ORAL_TABLET | Freq: Every evening | ORAL | Status: DC | PRN

## 2018-09-28 MED ORDER — FUROSEMIDE 20 MG PO TABS
20.0000 mg | ORAL_TABLET | Freq: Every day | ORAL | Status: DC
  Administered 2018-09-28 – 2018-09-29 (×2): 20 mg via ORAL
  Filled 2018-09-28 (×2): qty 1

## 2018-09-28 MED ORDER — LOSARTAN POTASSIUM 50 MG PO TABS
50.0000 mg | ORAL_TABLET | Freq: Every day | ORAL | Status: DC
  Administered 2018-09-28 – 2018-09-29 (×2): 50 mg via ORAL
  Filled 2018-09-28 (×2): qty 1

## 2018-09-28 MED ORDER — MAGNESIUM CITRATE PO SOLN: 1.0000 | Freq: Once | ORAL | Status: DC | PRN

## 2018-09-28 NOTE — Progress Notes (Signed)
PT Cancellation Note  Patient Details Name: Saveena Vosburgh MRN: [id number] DOB: 19-Jul-1946   Cancelled Treatment:    Reason Eval/Treat Not Completed: Fatigue/lethargy limiting ability to participate RN reports pt very tired and groggy this morning.  Will check back as schedule permits.   Amillion Scobee,KATHrine E 09/28/2018, 11:42 AM Zenovia Jarred, PT, DPT Acute Rehabilitation Services Office: [phone] Pager: [phone]

## 2018-09-28 NOTE — Progress Notes (Signed)
PROGRESS NOTE                                                                                                                                                                                                             Patient Demographics:    Carrie Warner, is a 72 y.o. female, DOB - Jan 26, 1946, GDJ:[id number]  Admit date - 09/27/2018   Admitting Physician Tonye Royalty, DO  Outpatient Primary MD for the patient is Jackie Plum, MD  LOS - 0  Outpatient Specialists: Cardiology (Dr. Rennis Golden)  Chief Complaint  Patient presents with  . Headache       Brief Narrative 72 year old female with history of paroxysmal A. 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  She had an event while she was standing at the church patient unable to give a detailed history, denies any bowel or urinary incontinence or witnessed seizure.  Daughter who lives with her noted that she has been confused lately with decreased p.o. intake and a gradual decline since her stroke 1 year back. No history of fevers, chills, recent fall, nausea, vomiting, diarrhea, recent illness or travel.  No change in her medication.   Subjective:   Patient denies any dizziness or headache this morning.  Falls back to sleep during conversation.  Reports that she has had similar syncopal episodes in the past few months.   Assessment  & Plan :   Principal problem Syncope and collapse Cardiac versus neurogenic.  Head CT and MRI brain negative for acute stroke.  2D echo shows normal LVEF with no wall motion abnormality and grade 1 diastolic dysfunction.  (Unchanged since her last echo 2 months back).  Carotid ultrasound pending.  Monitor on telemetry.  Neurochecks. Ordered EEG to rule out seizures.  TSH and lipid panel normal. PT recommends home health. If all work-up negative she would  be discharged home with cardiology appointment for 30-day event monitor.  History of coronary artery disease/CVA Continue aspirin, Plavix, Lipitor, Zetia and beta-blocker  Essential hypertension Continue Lasix, Cozaar and metoprolol  Diabetes mellitus type 2, insulin-dependent, with hypoglycemia CBG was in the 50s last night.  Patient is on Lantus 45 units twice daily.  I have reduced her dose to 20 units twice daily.  Check A1c.  Hypokalemia Replenished  Essential hypertension Stable.  Continue home meds  Anemia Drop in almost  2-3 g from her baseline.  Check iron panel and hemoccult and b12    Code Status : Full code  Family Communication  : Sister at bedside.  Called daughter and left a message  Disposition Plan  : Home with home health possibly tomorrow once work-up completed and stable.  Barriers For Discharge : Active symptoms, pending work-up  Consults  : None  Procedures  : CT head, MRI brain, 2D echo, carotid Doppler, EEG pending  DVT Prophylaxis  :  Lovenox -   Lab Results  Component Value Date   PLT 205 09/28/2018    Antibiotics  :   Anti-infectives (From admission, onward)   None        Objective:   Vitals:   09/28/18 0528 09/28/18 0856 09/28/18 1043 09/28/18 1436  BP:  (!) 144/66 (!) 153/69 (!) 152/71  Pulse:  64 65 66  Resp:   18 18  Temp:   97.9 F (36.6 C) 98 F (36.7 C)  TempSrc:   Oral Oral  SpO2:   97% 97%  Weight: 83.4 kg     Height:        Wt Readings from Last 3 Encounters:  09/28/18 83.4 kg  09/22/18 83.5 kg  07/01/18 85.7 kg     Intake/Output Summary (Last 24 hours) at 09/28/2018 1728 Last data filed at 09/28/2018 1623 Gross per 24 hour  Intake 1711.01 ml  Output -  Net 1711.01 ml     Physical Exam  Gen: not in distress HEENT: no pallor, moist mucosa, supple neck Chest: clear b/l, no added sounds CVS: N S1&S2, no murmurs, rubs or gallop GI: soft, NT, ND, BS+ Musculoskeletal: warm, no edema CNS: AAO x2,  nonfocal    Data Review:    CBC Recent Labs  Lab 09/27/18 2142 09/28/18 0454  WBC 7.1 7.2  HGB 11.9* 10.5*  HCT 39.4 35.1*  PLT 223 205  MCV 98.5 98.3  MCH 29.8 29.4  MCHC 30.2 29.9*  RDW 13.2 13.2    Chemistries  Recent Labs  Lab 09/27/18 2142 09/28/18 0454  NA 142 141  K 3.8 3.4*  CL 106 108  CO2 29 27  GLUCOSE 107* 116*  BUN 9 9  CREATININE 0.91 0.80  CALCIUM 9.2 8.8*  MG 1.8  --   AST 25  --   ALT 16  --   ALKPHOS 89  --   BILITOT 0.5  --    ------------------------------------------------------------------------------------------------------------------ No results for input(s): CHOL, HDL, LDLCALC, TRIG, CHOLHDL, LDLDIRECT in the last 72 hours.  Lab Results  Component Value Date   HGBA1C 8.5 (H) 07/19/2017   ------------------------------------------------------------------------------------------------------------------ No results for input(s): TSH, T4TOTAL, T3FREE, THYROIDAB in the last 72 hours.  Invalid input(s): FREET3 ------------------------------------------------------------------------------------------------------------------ No results for input(s): VITAMINB12, FOLATE, FERRITIN, TIBC, IRON, RETICCTPCT in the last 72 hours.  Coagulation profile No results for input(s): INR, PROTIME in the last 168 hours.  No results for input(s): DDIMER in the last 72 hours.  Cardiac Enzymes Recent Labs  Lab 09/27/18 2142  TROPONINI <0.03   ------------------------------------------------------------------------------------------------------------------ No results found for: BNP  Inpatient Medications  Scheduled Meds: . aspirin EC  81 mg Oral Daily  . atorvastatin  40 mg Oral q1800  . cholecalciferol  1,000 Units Oral Daily  . clopidogrel  75 mg Oral Daily  . enoxaparin (LOVENOX) injection  40 mg Subcutaneous Daily  . ezetimibe  10 mg Oral Daily  .  furosemide  20 mg Oral Daily  . insulin aspart  0-15 Units Subcutaneous TID WC  . insulin  aspart  0-5 Units Subcutaneous QHS  . insulin glargine  20 Units Subcutaneous BID  . losartan  50 mg Oral Daily  . metoprolol tartrate  25 mg Oral BID  . sodium chloride flush  3 mL Intravenous Q12H   Continuous Infusions: . sodium chloride 75 mL/hr at 09/28/18 1320   PRN Meds:.acetaminophen **OR** acetaminophen, alum & mag hydroxide-simeth, bisacodyl, magnesium citrate, nitroGLYCERIN, ondansetron **OR** ondansetron (ZOFRAN) IV, senna-docusate, traMADol  Micro Results No results found for this or any previous visit (from the past 240 hour(s)).  Radiology Reports X-ray Chest Pa And Lateral  Result Date: 09/28/2018 CLINICAL DATA:  History of stroke, diabetes EXAM: CHEST - 2 VIEW COMPARISON:  09/27/2018 FINDINGS: Low lung volumes. Atelectasis at the left base. No pleural effusion. Normal heart size. No pneumothorax. Degenerative changes of the spine. IMPRESSION: No active cardiopulmonary disease. Electronically Signed   By: Jasmine Pang M.D.   On: 09/28/2018 14:20   Dg Ribs Unilateral W/chest Right  Result Date: 09/09/2018 CLINICAL DATA:  Right rib pain after fall today. EXAM: RIGHT RIBS AND CHEST - 3+ VIEW COMPARISON:  Radiographs of June 01, 2018. FINDINGS: No fracture or other bone lesions are seen involving the ribs. There is no evidence of pneumothorax or pleural effusion. Both lungs are clear. Heart size and mediastinal contours are within normal limits. IMPRESSION: Normal right ribs.  No acute cardiopulmonary abnormality seen. Electronically Signed   By: Lupita Raider, M.D.   On: 09/09/2018 12:50   Ct Head Wo Contrast  Result Date: 09/27/2018 CLINICAL DATA:  Altered level of consciousness.  Headache. EXAM: CT HEAD WITHOUT CONTRAST TECHNIQUE: Contiguous axial images were obtained from the base of the skull through the vertex without intravenous contrast. COMPARISON:  09/09/2018 FINDINGS: Brain: Mild chronic small vessel disease throughout the deep white matter. No acute intracranial  abnormality. Specifically, no hemorrhage, hydrocephalus, mass lesion, acute infarction, or significant intracranial injury. Vascular: No hyperdense vessel or unexpected calcification. Skull: No acute calvarial abnormality. Sinuses/Orbits: Visualized paranasal sinuses and mastoids clear. Orbital soft tissues unremarkable. 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 No acute intracranial abnormality. Electronically Signed   By: Charlett Nose M.D.   On: 09/27/2018 21:28   Ct Head Wo Contrast  Result Date: 09/09/2018 CLINICAL DATA:  Done a suitcase this morning and fell, denies loss of consciousness, RIGHT-side head pain, history coronary artery disease post MI, atrial fibrillation, stroke, type II diabetes mellitus, essential hypertension EXAM: CT HEAD WITHOUT CONTRAST TECHNIQUE: Contiguous axial images were obtained from the base of the skull through the vertex without intravenous contrast. Sagittal and coronal MPR images reconstructed from axial data set. COMPARISON:  06/01/2018 FINDINGS: Brain: Normal ventricular morphology. No midline shift or mass effect. Small vessel chronic ischemic changes of deep cerebral white matter. Old LEFT cerebellar infarct. No definite intracranial hemorrhage, mass lesion, or evidence of acute infarction. No extra-axial fluid collections. Vascular: No hyperdense collections. Atherosclerotic calcification of internal carotid and vertebral arteries at skull base. Skull: Intact Sinuses/Orbits: Clear Other: N/A IMPRESSION: Small vessel chronic ischemic changes of deep cerebral white matter. Small old LEFT cerebellar infarct. No acute intracranial abnormalities. Electronically Signed   By: Ulyses Southward M.D.   On: 09/09/2018 13:59   Mr Brain Wo Contrast  Result Date: 09/28/2018 CLINICAL DATA:  Confusion. Syncopal episode. History of prior stroke. EXAM: MRI HEAD WITHOUT CONTRAST  TECHNIQUE: Multiplanar, multiecho pulse sequences of the  brain and surrounding structures were obtained without intravenous contrast. COMPARISON:  CT yesterday.  MRI 05/21/2018. FINDINGS: Brain: Diffusion imaging does not show any acute or subacute infarction. There chronic small vessel infarctions within the pons. There are a few old small vessel cerebellar infarctions. There are chronic small-vessel changes affecting the thalami, basal ganglia and cerebral hemispheric white matter. No mass lesion, hemorrhage, hydrocephalus or extra-axial collection. Vascular: As seen previously, there is abnormal flow of the left vertebral artery. Flow is present in both carotids. Skull and upper cervical spine: Negative Sinuses/Orbits: Clear/normal Other: None IMPRESSION: No acute finding by MRI. Multiple old infarctions affecting the brainstem, left cerebellum and thalami. Chronic small-vessel ischemic changes throughout the brain elsewhere. Electronically Signed   By: Paulina Fusi M.D.   On: 09/28/2018 07:14   Dg Chest Port 1 View  Result Date: 09/27/2018 CLINICAL DATA:  Weakness. EXAM: PORTABLE CHEST 1 VIEW COMPARISON:  Radiographs of September 09, 2018. FINDINGS: The heart size and mediastinal contours are within normal limits. Both lungs are clear. The visualized skeletal structures are unremarkable. IMPRESSION: No active disease. Electronically Signed   By: Lupita Raider, M.D.   On: 09/27/2018 21:15   Dg Knee Complete 4 Views Right  Result Date: 09/09/2018 CLINICAL DATA:  Right knee pain after fall today. EXAM: RIGHT KNEE - COMPLETE 4+ VIEW COMPARISON:  None. FINDINGS: No evidence of fracture, dislocation, or joint effusion. No evidence of arthropathy. Soft tissues are unremarkable. Moderate patellar spurring is noted. Vascular calcifications are noted. IMPRESSION: No acute abnormality seen in the right knee. Electronically Signed   By: Lupita Raider, M.D.   On: 09/09/2018 12:59    Time Spent in minutes  25   Blanca Carreon M.D on 09/28/2018 at 5:28 PM  Between  7am to 7pm - Pager - [phone]  After 7pm go to [email or web address] - password [facility]  [facility] -  Office  [phone]

## 2018-09-28 NOTE — Evaluation (Signed)
Physical Therapy Evaluation Patient Details Name: Carrie Warner MRN: [id number] DOB: [birth year] Today's Date: 09/28/2018   History of Present Illness  72 y.o. female with a known history of diabetes, paroxysmal atrial fibrillation, coronary artery disease, CVA about 1 year ago presents to the emergency department for evaluation of syncope.  Patient was in a usual state of health until she sustained a syncopal event while standing at church  Clinical Impression  Pt admitted with above diagnosis. Pt currently with functional limitations due to the deficits listed below (see PT Problem List).  Pt will benefit from skilled PT to increase their independence and safety with mobility to allow discharge to the venue listed below.  Pt reports she is from home and lives with her daughter.  Pt reports most recent fall 2 weeks ago and uncertain cause of her fall, "maybe I tripped on something?"  Pt unsteady during ambulation and required min assist so recommend assist available for mobility upon d/c for safety.     Follow Up Recommendations Home health PT;Supervision for mobility/OOB    Equipment Recommendations  None recommended by PT    Recommendations for Other Services       Precautions / Restrictions Precautions Precautions: Fall      Mobility  Bed Mobility Overal bed mobility: Needs Assistance Bed Mobility: Sit to Supine       Sit to supine: Supervision;HOB elevated   General bed mobility comments: increased time  Transfers Overall transfer level: Needs assistance Equipment used: Rolling walker (2 wheeled) Transfers: Sit to/from Stand Sit to Stand: Min guard         General transfer comment: min/guard for safety, cues for hand placement  Ambulation/Gait Ambulation/Gait assistance: Min assist Gait Distance (Feet): 60 Feet Assistive device: Rolling walker (2 wheeled) Gait Pattern/deviations: Step-through pattern;Decreased stride length     General Gait Details: verbal  cues for posture, pt with 3 instances of LOB with min assist to correct  Stairs            Wheelchair Mobility    Modified Rankin (Stroke Patients Only)       Balance Overall balance assessment: History of Falls;Needs assistance(reports most recent fall 2 weeks ago)         Standing balance support: Bilateral upper extremity supported Standing balance-Leahy Scale: Poor Standing balance comment: require UE support                             Pertinent Vitals/Pain Pain Assessment: No/denies pain    Home Living Family/patient expects to be discharged to:: Private residence Living Arrangements: Children;Other relatives Available Help at Discharge: Family;Available 24 hours/day Type of Home: Apartment Home Access: Stairs to enter Entrance Stairs-Rails: Right Entrance Stairs-Number of Steps: 13 Home Layout: One level Home Equipment: Walker - 2 wheels      Prior Function Level of Independence: Needs assistance   Gait / Transfers Assistance Needed: Used RW for ambulation, however, reports she does not need assist.   ADL's / Homemaking Assistance Needed: Reports daughter assists with ADL/IADL tasks.  wears pull ups         Hand Dominance        Extremity/Trunk Assessment        Lower Extremity Assessment Lower Extremity Assessment: Generalized weakness       Communication   Communication: No difficulties  Cognition Arousal/Alertness: Awake/alert Behavior During Therapy: Flat affect Overall Cognitive Status: Within Functional Limits for tasks assessed  General Comments: appropriate for tasks assessed      General Comments      Exercises     Assessment/Plan    PT Assessment Patient needs continued PT services  PT Problem List Decreased mobility;Decreased strength;Decreased activity tolerance;Decreased balance;Decreased knowledge of use of DME       PT Treatment Interventions DME  instruction;Therapeutic activities;Gait training;Therapeutic exercise;Patient/family education;Stair training;Functional mobility training;Balance training    PT Goals (Current goals can be found in the Care Plan section)  Acute Rehab PT Goals PT Goal Formulation: With patient Time For Goal Achievement: 10/05/18 Potential to Achieve Goals: Good    Frequency Min 3X/week   Barriers to discharge        Co-evaluation               AM-PAC PT "6 Clicks" Daily Activity  Outcome Measure Difficulty turning over in bed (including adjusting bedclothes, sheets and blankets)?: A Little Difficulty moving from lying on back to sitting on the side of the bed? : A Little Difficulty sitting down on and standing up from a chair with arms (e.g., wheelchair, bedside commode, etc,.)?: A Little Help needed moving to and from a bed to chair (including a wheelchair)?: A Little Help needed walking in hospital room?: A Little Help needed climbing 3-5 steps with a railing? : A Lot 6 Click Score: 17    End of Session   Activity Tolerance: Patient limited by fatigue Patient left: in bed;with nursing/sitter in room;with family/visitor present;with call bell/phone within reach Nurse Communication: Mobility status PT Visit Diagnosis: Difficulty in walking, not elsewhere classified (R26.2);Unsteadiness on feet (R26.81)    Time: 9242-6834 PT Time Calculation (min) (ACUTE ONLY): 11 min   Charges:   PT Evaluation $PT Eval Low Complexity: 1 Low         Zenovia Jarred, PT, DPT Acute Rehabilitation Services Office: [phone] Pager: [phone]  Sarajane Jews 09/28/2018, 4:24 PM

## 2018-09-28 NOTE — Care Management Obs Status (Signed)
MEDICARE OBSERVATION STATUS NOTIFICATION   Patient Details  Name: Carrie Warner MRN: [id number] Date of Birth: [birth year]   Medicare Observation Status Notification Given:   yes    Geni Bers, RN 09/28/2018, 3:32 PM

## 2018-09-28 NOTE — ED Notes (Signed)
ED TO INPATIENT HANDOFF REPORT  Name/Age/Gender Carrie Warner 72 y.o. female  Code Status Code Status History    Date Active Date Inactive Code Status Order ID Comments User Context   09/26/2017 1752 09/30/2017 2121 Full Code [id number]  Jettie Booze, MD Inpatient   07/19/2017 1359 07/22/2017 1659 Full Code [id number]  Hosie Poisson, MD Inpatient      Home/SNF/Other Home  Chief Complaint headache  Level of Care/Admitting Diagnosis ED Disposition    ED Disposition Condition [place] [facility] Area: [facility] [[id number]]  Level of Care: Telemetry [5]  Admit to tele based on following criteria: Eval of Syncope  Diagnosis: Syncope and collapse [780.2.ICD-9-CM]  Admitting Physician: Harvie Bridge [[id number]]  Attending Physician: Harvie Bridge [[id number]]  PT Class (Do Not Modify): Observation [104]  PT Acc Code (Do Not Modify): Observation [10022]       Medical History Past Medical History:  Diagnosis Date  . Coronary artery disease involving native coronary artery of native heart with angina pectoris ([place]) 09/26/2017   Cath for Abnormal Myoivew - Anterior Ischemia: Mid LAD-2 lesion, 90 %stenosed. After atherectomyand PTCA, there was no residual stenosis. Post intervention, there is a 0% residual stenosis. Mid LAD-1 lesion, 70 %stenosed. THis was treated with atherectomy, PTCA and stent placement. A STENT SIERRA 3.00 X 18 MM drug eluting stent was successfully placed. Post intervention, there is a 0% residual ste  . Diabetes mellitus without complication ([place])   . Myocardial infarction involving left anterior descending (LAD) coronary artery ([place]) 09/26/2017   Type 4a MI - post PCI  . PAF (paroxysmal atrial fibrillation) ([place]) 09/25/2017   Noted during LAD PCI  . Presence of drug coated stent in LAD coronary artery: STENT SIERRA 3.00 X 18 MM drug eluting stent 09/26/2017   Mid LAD-2 lesion, 90 %stenosed & Mid LAD-1 lesion, 70 %stenosed.  Lesions treated with atherectomy, PTCA and stent placement (in worst segment). A STENT SIERRA 3.00 X 18 MM drug eluting stent was successfully placed.   . Stroke ([place]) 07/2017    Allergies Allergies  Allergen Reactions  . Bee Venom Anaphylaxis    IV Location/Drains/Wounds Patient Lines/Drains/Airways Status   Active Line/Drains/Airways    Name:   Placement date:   Placement time:   Site:   Days:   Peripheral IV 09/27/18 Left Forearm   09/27/18    1940    Forearm   1          Labs/Imaging Results for orders placed or performed during the hospital encounter of 09/27/18 (from the past 48 hour(s))  CBC     Status: Abnormal   Collection Time: 09/27/18  9:42 PM  Result Value Ref Range   WBC 7.1 4.0 - 10.5 K/uL   RBC 4.00 3.87 - 5.11 MIL/uL   Hemoglobin 11.9 (L) 12.0 - 15.0 g/dL   HCT 39.4 36.0 - 46.0 %   MCV 98.5 80.0 - 100.0 fL   MCH 29.8 26.0 - 34.0 pg   MCHC 30.2 30.0 - 36.0 g/dL   RDW 13.2 11.5 - 15.5 %   Platelets 223 150 - 400 K/uL   nRBC 0.0 0.0 - 0.2 %    Comment: Performed at [facility], [place] [address], [place], [place] [address]  Comprehensive metabolic panel     Status: Abnormal   Collection Time: 09/27/18  9:42 PM  Result Value Ref Range   Sodium 142 135 - 145 mmol/L   Potassium 3.8 3.5 -  5.1 mmol/L   Chloride 106 98 - 111 mmol/L   CO2 29 22 - 32 mmol/L   Glucose, Bld 107 (H) 70 - 99 mg/dL   BUN 9 8 - 23 mg/dL   Creatinine, Ser 0.91 0.44 - 1.00 mg/dL   Calcium 9.2 8.9 - 10.3 mg/dL   Total Protein 7.4 6.5 - 8.1 g/dL   Albumin 3.5 3.5 - 5.0 g/dL   AST 25 15 - 41 U/L   ALT 16 0 - 44 U/L   Alkaline Phosphatase 89 38 - 126 U/L   Total Bilirubin 0.5 0.3 - 1.2 mg/dL   GFR calc non Af Amer >60 >60 mL/min   GFR calc Af Amer >60 >60 mL/min    Comment: (NOTE) The eGFR has been calculated using the CKD EPI equation. This calculation has not been validated in all clinical situations. eGFR's persistently <60 mL/min signify possible Chronic  Kidney Disease.    Anion gap 7 5 - 15    Comment: Performed at [facility], [place] [address], [place], [place] [address]  Urinalysis, Routine w reflex microscopic     Status: None   Collection Time: 09/27/18  9:42 PM  Result Value Ref Range   Color, Urine YELLOW YELLOW   APPearance CLEAR CLEAR   Specific Gravity, Urine 1.012 1.005 - 1.030   pH 6.0 5.0 - 8.0   Glucose, UA NEGATIVE NEGATIVE mg/dL   Hgb urine dipstick NEGATIVE NEGATIVE   Bilirubin Urine NEGATIVE NEGATIVE   Ketones, ur NEGATIVE NEGATIVE mg/dL   Protein, ur NEGATIVE NEGATIVE mg/dL   Nitrite NEGATIVE NEGATIVE   Leukocytes, UA NEGATIVE NEGATIVE    Comment: Performed at [facility] [address] [place], [place] [address]  Troponin I     Status: None   Collection Time: 09/27/18  9:42 PM  Result Value Ref Range   Troponin I <0.03 <0.03 ng/mL    Comment: Performed at [facility], [place] [address], [place], [place] [address]   Ct Head Wo Contrast  Result Date: 09/27/2018 CLINICAL DATA:  Altered level of consciousness.  Headache. EXAM: CT HEAD WITHOUT CONTRAST TECHNIQUE: Contiguous axial images were obtained from the base of the skull through the vertex without intravenous contrast. COMPARISON:  09/09/2018 FINDINGS: Brain: Mild chronic small vessel disease throughout the deep white matter. No acute intracranial abnormality. Specifically, no hemorrhage, hydrocephalus, mass lesion, acute infarction, or significant intracranial injury. Vascular: No hyperdense vessel or unexpected calcification. Skull: No acute calvarial abnormality. Sinuses/Orbits: Visualized paranasal sinuses and mastoids clear. Orbital soft tissues unremarkable. 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 No acute intracranial abnormality. Electronically Signed   By: Rolm Baptise M.D.   On: 09/27/2018 21:28   Dg Chest Port 1 View  Result Date:  09/27/2018 CLINICAL DATA:  Weakness. EXAM: PORTABLE CHEST 1 VIEW COMPARISON:  Radiographs of September 09, 2018. FINDINGS: The heart size and mediastinal contours are within normal limits. Both lungs are clear. The visualized skeletal structures are unremarkable. IMPRESSION: No active disease. Electronically Signed   By: Marijo Conception, M.D.   On: 09/27/2018 21:15   EKG Interpretation  Date/Time:  Sunday September 27 2018 21:26:21 EDT Ventricular Rate:  69 PR Interval:    QRS Duration: 91 QT Interval:  410 QTC Calculation: 440 R Axis:   -15 Text Interpretation:  Normal sinus rhythm Non-specific ST-t changes No significant change since last tracing Confirmed by Pattricia Boss [phone]) on 09/27/2018 10:51:36 PM  Pending Labs [employer] (From admission, onward)    Start     Ordered   Signed and Held  CBC  (enoxaparin (LOVENOX)    CrCl >/= 30 ml/min)  Once,   R    Comments:  Baseline for enoxaparin therapy IF NOT ALREADY DRAWN.  Notify MD if PLT < 100 K.    Signed and Held   Signed and Held  Creatinine, serum  (enoxaparin (LOVENOX)    CrCl >/= 30 ml/min)  Once,   R    Comments:  Baseline for enoxaparin therapy IF NOT ALREADY DRAWN.    Signed and Held   Signed and Held  Creatinine, serum  (enoxaparin (LOVENOX)    CrCl >/= 30 ml/min)  Weekly,   R    Comments:  while on enoxaparin therapy    Signed and Held   Signed and Held  Magnesium  Add-on,   R     Signed and Held   Signed and Held  Phosphorus  Add-on,   R     Signed and Held   Signed and Occupational hygienist morning,   R     Signed and Held   Signed and Held  CBC  Tomorrow morning,   R     Signed and Held          Vitals/Pain Today's Vitals   09/27/18 2035 09/27/18 2132  BP: (!) 161/98   Pulse: 71   Resp: 16   Temp: 98.2 F (36.8 C) 98.5 F (36.9 C)  TempSrc: Oral Rectal  SpO2: 95%   PainSc: 7      Isolation Precautions No active isolations  Medications Medications - No data to  display  Mobility walks with device

## 2018-09-28 NOTE — Progress Notes (Signed)
  Echocardiogram 2D Echocardiogram has been performed.  Patient pushing probe off when viewing parasternal window and is very agitated when viewing apical and subcostal window. Patient wanted tech to discontinue exam.   Carrie Warner 09/28/2018, 10:02 AM

## 2018-09-28 NOTE — Progress Notes (Signed)
Carotid artery duplex has been completed. 1-39% ICA stenosis bilaterally.  09/28/18 9:24 AM Olen Cordial RVT

## 2018-09-28 NOTE — Progress Notes (Signed)
PHARMACIST - PHYSICIAN ORDER COMMUNICATION  CONCERNING: P&T Medication Policy on Herbal Medications  DESCRIPTION:  This patient's order for:  Biotin  has been noted.  This product(s) is classified as an "herbal" or natural product. Due to a lack of definitive safety studies or FDA approval, nonstandard manufacturing practices, plus the potential risk of unknown drug-drug interactions while on inpatient medications, the Pharmacy and Therapeutics Committee does not permit the use of "herbal" or natural products of this type within [facility]   ACTION TAKEN: The pharmacy department is unable to verify this order at this time and your patient has been informed of this safety policy. Please reevaluate patient's clinical condition at discharge and address if the herbal or natural product(s) should be resumed at that time.   Thanks Lorenza Evangelist 09/28/2018 1:58 AM

## 2018-09-28 NOTE — Evaluation (Signed)
Clinical/Bedside Swallow Evaluation Patient Details  Name: Carrie Warner MRN: [id number] Date of Birth: 1946/08/24  Today's Date: 09/28/2018 Time: SLP Start Time (ACUTE ONLY): 1110 SLP Stop Time (ACUTE ONLY): 1125 SLP Time Calculation (min) (ACUTE ONLY): 15 min  Past Medical History:  Past Medical History:  Diagnosis Date  . Coronary artery disease involving native coronary artery of native heart with angina pectoris ([facility]) 09/26/2017   Cath for Abnormal Myoivew - Anterior Ischemia: Mid LAD-2 lesion, 90 %stenosed. After atherectomyand PTCA, there was no residual stenosis. Post intervention, there is a 0% residual stenosis. Mid LAD-1 lesion, 70 %stenosed. THis was treated with atherectomy, PTCA and stent placement. A STENT SIERRA 3.00 X 18 MM drug eluting stent was successfully placed. Post intervention, there is a 0% residual ste  . Diabetes mellitus without complication ([facility])   . Myocardial infarction involving left anterior descending (LAD) coronary artery ([facility]) 09/26/2017   Type 4a MI - post PCI  . PAF (paroxysmal atrial fibrillation) ([facility]) 09/25/2017   Noted during LAD PCI  . Presence of drug coated stent in LAD coronary artery: STENT SIERRA 3.00 X 18 MM drug eluting stent 09/26/2017   Mid LAD-2 lesion, 90 %stenosed & Mid LAD-1 lesion, 70 %stenosed. Lesions treated with atherectomy, PTCA and stent placement (in worst segment). A STENT SIERRA 3.00 X 18 MM drug eluting stent was successfully placed.   . Stroke ([facility]) 07/2017   Past Surgical History:  Past Surgical History:  Procedure Laterality Date  . CORONARY ATHERECTOMY N/A 09/26/2017   Procedure: CORONARY ATHERECTOMY;  Surgeon: Corky Crafts, MD;  Location: [facility]AS[facility] CV LAB;  Service: Cardiovascular;  Laterality: N/A;  . CORONARY STENT INTERVENTION N/A 09/26/2017   Procedure: CORONARY STENT INTERVENTION;  Surgeon: Corky Crafts, MD;  Location: [facility] INVASIVE CV LAB;  Service: Cardiovascular;  Laterality: N/A;  . LEFT  HEART CATH AND CORONARY ANGIOGRAPHY N/A 09/26/2017   Procedure: LEFT HEART CATH AND CORONARY ANGIOGRAPHY;  Surgeon: Corky Crafts, MD;  Location: [facility] INVASIVE CV LAB;  Service: Cardiovascular;  Laterality: N/A;   HPI:  72 yo female adm to [facility] with AMS.  PMH + for DM, CVA x1 year ago, decreaesed appetite and mentation since her CVA, resides with daughter since her CVA.  Swallow evaluation ordered.    Assessment / Plan / Recommendation Clinical Impression  Functional oropharyngeal swallow based on clinical swallow evaluation as ordered by Md.  Pt easily passed 3 ounce water test without difficulties.  Mastication was timely and swift with no residuals.  Observed pt rapidly consuming breakfast - at 1130 am - she was asleep earlier.  Pt denies dysphagia since her stroke.  Recommend reg/thin diet - no SlP follow up.   SLP Visit Diagnosis: Dysphagia, unspecified (R13.10)    Aspiration Risk  No limitations    Diet Recommendation Regular;Thin liquid   Liquid Administration via: Cup;Straw Medication Administration: Whole meds with liquid Supervision: Patient able to self feed Compensations: Minimize environmental distractions;Slow rate;Small sips/bites Postural Changes: Seated upright at 90 degrees    Other  Recommendations Oral Care Recommendations: Oral care BID   Follow up Recommendations None      Frequency and Duration   n/a         Prognosis   n/a     Swallow Study   General Date of Onset: 09/28/18 HPI: 72 yo female adm to [facility] with AMS.  PMH + for DM, CVA x1 year ago, decreaesed appetite and mentation since her CVA, resides with daughter since her  CVA.  Swallow evaluation ordered.  Type of Study: Bedside Swallow Evaluation Diet Prior to this Study: Regular;Thin liquids Temperature Spikes Noted: No Respiratory Status: Room air History of Recent Intubation: No Behavior/Cognition: Alert;Cooperative;Pleasant mood Oral Cavity Assessment: Within Functional Limits Oral Care  Completed by SLP: No Oral Cavity - Dentition: Adequate natural dentition Vision: Functional for self-feeding Self-Feeding Abilities: Able to feed self Patient Positioning: Upright in bed Baseline Vocal Quality: Normal Volitional Cough: Strong Volitional Swallow: Able to elicit    Oral/Motor/Sensory Function Overall Oral Motor/Sensory Function: Within functional limits   Ice Chips Ice chips: Impaired   Thin Liquid Thin Liquid: Within functional limits Presentation: Cup;Straw Other Comments: 3 ounce water test    Nectar Thick Nectar Thick Liquid: Not tested   Honey Thick Honey Thick Liquid: Not tested   Puree Puree: Within functional limits Presentation: Self Fed;Spoon   Solid     Solid: Within functional limits Presentation: Self Fed;Spoon      Chales Abrahams 09/28/2018,11:32 AM Donavan Burnet, MS [facility] SLP Acute Rehab Services Pager [phone] Office [phone]

## 2018-09-29 ENCOUNTER — Observation Stay (HOSPITAL_COMMUNITY)
Admit: 2018-09-29 | Discharge: 2018-09-29 | Disposition: A | Discharge status: Home / Self Care | Payer: Medicare HMO | Attending: Internal Medicine | Admitting: Internal Medicine

## 2018-09-29 DIAGNOSIS — Z794 Long term (current) use of insulin: Secondary | ICD-10-CM

## 2018-09-29 DIAGNOSIS — D649 Anemia, unspecified: Secondary | ICD-10-CM | POA: Diagnosis not present

## 2018-09-29 DIAGNOSIS — I10 Essential (primary) hypertension: Secondary | ICD-10-CM | POA: Diagnosis not present

## 2018-09-29 DIAGNOSIS — R55 Syncope and collapse: Secondary | ICD-10-CM | POA: Diagnosis not present

## 2018-09-29 DIAGNOSIS — E876 Hypokalemia: Secondary | ICD-10-CM

## 2018-09-29 DIAGNOSIS — E11649 Type 2 diabetes mellitus with hypoglycemia without coma: Secondary | ICD-10-CM | POA: Diagnosis not present

## 2018-09-29 LAB — HEMOGLOBIN A1C
Hgb A1c MFr Bld: 9.2 % — ABNORMAL HIGH (ref 4.8–5.6)
MEAN PLASMA GLUCOSE: 217 mg/dL

## 2018-09-29 LAB — GLUCOSE, CAPILLARY
GLUCOSE-CAPILLARY: 173 mg/dL — AB (ref 70–99)
Glucose-Capillary: 81 mg/dL (ref 70–99)
Glucose-Capillary: 74 mg/dL (ref 70–99)

## 2018-09-29 MED ORDER — BASAGLAR KWIKPEN 100 UNIT/ML ~~LOC~~ SOPN: 18.0000 [IU] | PEN_INJECTOR | Freq: Two times a day (BID) | SUBCUTANEOUS | 1 refills | Status: AC

## 2018-09-29 NOTE — Plan of Care (Signed)
  Problem: Education: Goal: Knowledge of General Education information will improve Description: Including pain rating scale, medication(s)/side effects and non-pharmacologic comfort measures Outcome: Progressing   Problem: Activity: Goal: Risk for activity intolerance will decrease Outcome: Progressing   Problem: Nutrition: Goal: Adequate nutrition will be maintained Outcome: Progressing   

## 2018-09-29 NOTE — Care Management Note (Signed)
Case Management Note  Patient Details  Name: Carrie Warner MRN: [id number] Date of Birth: 1946/02/08  Subjective/Objective:                    Action/Plan:  Pt discharging home with HHRN/PT from [facility].    Expected Discharge Date:  09/29/18               Expected Discharge Plan:  Home w Home Health Services  In-House Referral:     Discharge planning Services  CM Consult  Post Acute Care Choice:    Choice offered to:  Patient  DME Arranged:    DME Agency:     HH Arranged:  RN, PT HH Agency:  [facility]  Status of Service:  Completed, signed off  If discussed at Long Length of Stay Meetings, dates discussed:    Additional CommentsGeni Bers, RN 09/29/2018, 2:51 PM

## 2018-09-29 NOTE — Discharge Summary (Addendum)
Physician Discharge Summary  Carrie Warner ZOX:[id number] DOB: 04-Sep-1946 DOA: 09/27/2018  PCP: Jackie Plum, MD  Admit date: 09/27/2018 Discharge date: 09/29/2018  Admitted From: Home Disposition: Home  Recommendations for Outpatient Follow-up:  Follow up with PCP in 1 week.  Please adjust insulin dose as outpatient. Follow up with cardiology ( Dr Rennis Golden) for outpt event monitor.( office has been contacted for appt)  Home Health: PT and RN Equipment/Devices: None  Discharge Condition: Fair CODE STATUS: Full code Diet recommendation: Heart Healthy / Carb Modified   Discharge Diagnoses:  Principal Problem:   Syncope and collapse   Active Problems:   Essential hypertension   Type 2 diabetes mellitus with complication (HCC)   Mixed hyperlipidemia   Anemia  Brief narrative/HPI 72 year old female with history of paroxysmal A. 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  She had an event while she [place] standing [place] the [employer] patient unable to give a detailed history, denies any bowel or urinary incontinence or witnessed seizure.  Daughter who lives with her noted that she has been confused lately with decreased p.o. intake and a gradual decline since her stroke 1 year back. No history of fevers, chills, recent fall, nausea, vomiting, diarrhea, recent illness or travel.  No change in her medication.  Head CT on admission was unremarkable.  Patient is in observation for further management of her syncope.  Hospital course   Principal problem Syncope and collapse Cardiac versus neurogenic.  Head CT and MRI brain negative for acute stroke.  2D echo shows normal LVEF with no wall motion abnormality and grade 1 diastolic dysfunction.  (Unchanged since her last echo 2 months back).  Carotid ultrasound without significant stenosis.   Stable on telemetry except for PVCs. EEG negative for epileptiform  activity..  TSH and lipid panel normal. PT recommends home health.  Discharge home with home health.  History of coronary artery disease/CVA Continue aspirin, Plavix, Lipitor, Zetia and beta-blocker  Essential hypertension Continue Lasix, Cozaar and metoprolol  Diabetes mellitus type 2, insulin-dependent, with hypoglycemia CBG was in the 50s during hospital stay.  Lantus dose reduced to 18 units twice daily upon discharge since CBG has been 90s-110s while on half the dose of Lantus.  Metformin was on hold. Her A1c is still high at 9.2. I will discharge her on low-dose of Lantus 18 units twice daily and resume her metformin. Monitor CBG as outpatient  Hypokalemia Replenished  Essential hypertension Stable.  Continue home meds  Anemia Slight drop in hemoglobin from baseline.  Follow-up as outpatient.     Family Communication  :  Discussed with daughter on the phone  Disposition Plan  : Home with home health  Consults  : None  Procedures  : CT head, MRI brain, 2D echo, carotid Doppler, EEG    Discharge Instructions   Allergies as of 09/29/2018      Reactions   Bee Venom Anaphylaxis      Medication List    TAKE these medications   aspirin EC 81 MG tablet Take 81 mg by mouth daily.   atorvastatin 40 MG tablet Commonly known as:  LIPITOR Take 1 tablet (40 mg total) by mouth daily at 6 PM.   BASAGLAR KWIKPEN 100 UNIT/ML Sopn Inject 0.18 mLs (18 Units total) into the skin 2 (two) times daily. What changed:  how much to take   Biotin 1000 MCG tablet Take 1,000 mcg by mouth daily.  cholecalciferol 1000 units tablet Commonly known as:  VITAMIN D Take 1,000 Units by mouth daily.   clopidogrel 75 MG tablet Commonly known as:  PLAVIX Take 1 tablet (75 mg total) by mouth daily.   ezetimibe 10 MG tablet Commonly known as:  ZETIA Take 1 tablet (10 mg total) by mouth daily.   furosemide 20 MG tablet Commonly known as:  LASIX Take 1 tablet (20  mg total) by mouth daily.   losartan 50 MG tablet Commonly known as:  COZAAR Take 1 tablet (50 mg total) by mouth daily.   metFORMIN 1000 MG tablet Commonly known as:  GLUCOPHAGE Take 1,000 mg by mouth 2 (two) times daily with a meal.   metoprolol tartrate 25 MG tablet Commonly known as:  LOPRESSOR Take 1 tablet (25 mg total) by mouth 2 (two) times daily.   mirtazapine 15 MG tablet Commonly known as:  REMERON Take 15 mg by mouth at bedtime.   nitroGLYCERIN 0.4 MG SL tablet Commonly known as:  NITROSTAT Place 1 tablet (0.4 mg total) under the tongue every 5 (five) minutes as needed. What changed:  additional instructions   traMADol 50 MG tablet Commonly known as:  ULTRAM Take 1 tablet (50 mg total) by mouth every 6 (six) hours as needed for severe pain. What changed:  additional instructions      Follow-up Information    Osei-Bonsu, Greggory Stallion, MD. Schedule an appointment as soon as possible for a visit in 1 week(s).   Specialty:  Internal Medicine Contact information: [address] [place] [address] [phone]        Chrystie Nose, MD. Schedule an appointment as soon as possible for a visit in 2 week(s).   Specialty:  Cardiology Contact information: [address] [place] [address] [place] [address] [phone]          Allergies  Allergen Reactions  . Bee Venom Anaphylaxis        Procedures/Studies: X-ray Chest Pa And Lateral  Result Date: 09/28/2018 CLINICAL DATA:  History of stroke, diabetes EXAM: CHEST - 2 VIEW COMPARISON:  09/27/2018 FINDINGS: Low lung volumes. Atelectasis at the left base. No pleural effusion. Normal heart size. No pneumothorax. Degenerative changes of the spine. IMPRESSION: No active cardiopulmonary disease. Electronically Signed   By: Jasmine Pang M.D.   On: 09/28/2018 14:20   Dg Ribs Unilateral W/chest Right  Result Date: 09/09/2018 CLINICAL DATA:  Right rib pain after fall today. EXAM: RIGHT RIBS AND  CHEST - 3+ VIEW COMPARISON:  Radiographs of June 01, 2018. FINDINGS: No fracture or other bone lesions are seen involving the ribs. There is no evidence of pneumothorax or pleural effusion. Both lungs are clear. Heart size and mediastinal contours are within normal limits. IMPRESSION: Normal right ribs.  No acute cardiopulmonary abnormality seen. Electronically Signed   By: Lupita Raider, M.D.   On: 09/09/2018 12:50   Ct Head Wo Contrast  Result Date: 09/27/2018 CLINICAL DATA:  Altered level of consciousness.  Headache. EXAM: CT HEAD WITHOUT CONTRAST TECHNIQUE: Contiguous axial images were obtained from the base of the skull through the vertex without intravenous contrast. COMPARISON:  09/09/2018 FINDINGS: Brain: Mild chronic small vessel disease throughout the deep white matter. No acute intracranial abnormality. Specifically, no hemorrhage, hydrocephalus, mass lesion, acute infarction, or significant intracranial injury. Vascular: No hyperdense vessel or unexpected calcification. Skull: No acute calvarial abnormality. Sinuses/Orbits: Visualized paranasal sinuses and mastoids clear. Orbital soft tissues unremarkable. Other: None IMPRESSION: Mild chronic small vessel  disease throughout the deep white matter. No acute intracranial abnormality. Electronically Signed   By: Charlett Nose M.D.   On: 09/27/2018 21:28   Ct Head Wo Contrast  Result Date: 09/09/2018 CLINICAL DATA:  Done a suitcase this morning and fell, denies loss of consciousness, RIGHT-side head pain, history coronary artery disease post MI, atrial fibrillation, stroke, type II diabetes mellitus, essential hypertension EXAM: CT HEAD WITHOUT CONTRAST TECHNIQUE: Contiguous axial images were obtained from the base of the skull through the vertex without intravenous contrast. Sagittal and coronal MPR images reconstructed from axial data set. COMPARISON:  06/01/2018 FINDINGS: Brain: Normal ventricular morphology. No midline shift or mass effect.  Small vessel chronic ischemic changes of deep cerebral white matter. Old LEFT cerebellar infarct. No definite intracranial hemorrhage, mass lesion, or evidence of acute infarction. No extra-axial fluid collections. Vascular: No hyperdense collections. Atherosclerotic calcification of internal carotid and vertebral arteries at skull base. Skull: Intact Sinuses/Orbits: Clear Other: N/A IMPRESSION: Small vessel chronic ischemic changes of deep cerebral white matter. Small old LEFT cerebellar infarct. No acute intracranial abnormalities. Electronically Signed   By: Ulyses Southward M.D.   On: 09/09/2018 13:59   Mr Brain Wo Contrast  Result Date: 09/28/2018 CLINICAL DATA:  Confusion. Syncopal episode. History of prior stroke. EXAM: MRI HEAD WITHOUT CONTRAST TECHNIQUE: Multiplanar, multiecho pulse sequences of the brain and surrounding structures were obtained without intravenous contrast. COMPARISON:  CT yesterday.  MRI 05/21/2018. FINDINGS: Brain: Diffusion imaging does not show any acute or subacute infarction. There chronic small vessel infarctions within the pons. There are a few old small vessel cerebellar infarctions. There are chronic small-vessel changes affecting the thalami, basal ganglia and cerebral hemispheric white matter. No mass lesion, hemorrhage, hydrocephalus or extra-axial collection. Vascular: As seen previously, there is abnormal flow of the left vertebral artery. Flow is present in both carotids. Skull and upper cervical spine: Negative Sinuses/Orbits: Clear/normal Other: None IMPRESSION: No acute finding by MRI. Multiple old infarctions affecting the brainstem, left cerebellum and thalami. Chronic small-vessel ischemic changes throughout the brain elsewhere. Electronically Signed   By: Paulina Fusi M.D.   On: 09/28/2018 07:14   Dg Chest Port 1 View  Result Date: 09/27/2018 CLINICAL DATA:  Weakness. EXAM: PORTABLE CHEST 1 VIEW COMPARISON:  Radiographs of September 09, 2018. FINDINGS: The heart  size and mediastinal contours are within normal limits. Both lungs are clear. The visualized skeletal structures are unremarkable. IMPRESSION: No active disease. Electronically Signed   By: Lupita Raider, M.D.   On: 09/27/2018 21:15   Dg Knee Complete 4 Views Right  Result Date: 09/09/2018 CLINICAL DATA:  Right knee pain after fall today. EXAM: RIGHT KNEE - COMPLETE 4+ VIEW COMPARISON:  None. FINDINGS: No evidence of fracture, dislocation, or joint effusion. No evidence of arthropathy. Soft tissues are unremarkable. Moderate patellar spurring is noted. Vascular calcifications are noted. IMPRESSION: No acute abnormality seen in the right knee. Electronically Signed   By: Lupita Raider, M.D.   On: 09/09/2018 12:59    2D echo Left ventricle: The cavity size was normal. Wall thickness was   normal. Systolic function was normal. The estimated ejection   fraction was in the range of 60% to 65%. Wall motion was normal;   there were no regional wall motion abnormalities. Doppler   parameters are consistent with abnormal left ventricular   relaxation (grade 1 diastolic dysfunction). The E/e&' ratio is   between 8-15, suggesting indeterminate LV filling pressure. - Mitral valve: Calcified annulus. Mildly thickened  leaflets .   There was mild regurgitation. - Left atrium: The atrium was normal in size.  Impressions:  - No changes compared to a prior study in 07/2018.   Subjective: Denies any symptoms.  No overnight events.  Discharge Exam: Vitals:   09/29/18 0506 09/29/18 1042  BP: (!) 158/72 (!) 150/70  Pulse: 60 63  Resp: 12 15  Temp: 98.1 F (36.7 C) 97.6 F (36.4 C)  SpO2: 98% 93%   Vitals:   09/29/18 0144 09/29/18 0506 09/29/18 0513 09/29/18 1042  BP: (!) 156/70 (!) 158/72  (!) 150/70  Pulse: 62 60  63  Resp: 12 12  15   Temp: 98.2 F (36.8 C) 98.1 F (36.7 C)  97.6 F (36.4 C)  TempSrc: Oral Oral  Oral  SpO2: 96% 98%  93%  Weight:   83.5 kg   Height:         General: Elderly female not in distress HEENT: Moist mucosa, supple neck Chest: Clear bilaterally CVS: Normal S1-S2, no murmurs rub or gallop GI: Soft, nondistended, nontender Musculoskeletal: Warm, no edema CNs: Alert and oriented, nonfocal    The results of significant diagnostics from this hospitalization (including imaging, microbiology, ancillary and laboratory) are listed below for reference.     Microbiology: No results found for this or any previous visit (from the past 240 hour(s)).   Labs: BNP (last 3 results) No results for input(s): BNP in the last 8760 hours. Basic Metabolic Panel: Recent Labs  Lab 09/27/18 2142 09/28/18 0454  NA 142 141  K 3.8 3.4*  CL 106 108  CO2 29 27  GLUCOSE 107* 116*  BUN 9 9  CREATININE 0.91 0.80  CALCIUM 9.2 8.8*  MG 1.8  --   PHOS 3.6  --    Liver Function Tests: Recent Labs  Lab 09/27/18 2142  AST 25  ALT 16  ALKPHOS 89  BILITOT 0.5  PROT 7.4  ALBUMIN 3.5   No results for input(s): LIPASE, AMYLASE in the last 168 hours. No results for input(s): AMMONIA in the last 168 hours. CBC: Recent Labs  Lab 09/27/18 2142 09/28/18 0454  WBC 7.1 7.2  HGB 11.9* 10.5*  HCT 39.4 35.1*  MCV 98.5 98.3  PLT 223 205   Cardiac Enzymes: Recent Labs  Lab 09/27/18 2142  TROPONINI <0.03   BNP: Invalid input(s): POCBNP CBG: Recent Labs  Lab 09/28/18 1723 09/28/18 2151 09/29/18 0510 09/29/18 0747 09/29/18 1140  GLUCAP 98 119* 81 74 173*   D-Dimer No results for input(s): DDIMER in the last 72 hours. Hgb A1c Recent Labs    09/28/18 0454  HGBA1C 9.2*   Lipid Profile No results for input(s): CHOL, HDL, LDLCALC, TRIG, CHOLHDL, LDLDIRECT in the last 72 hours. Thyroid function studies No results for input(s): TSH, T4TOTAL, T3FREE, THYROIDAB in the last 72 hours.  Invalid input(s): FREET3 Anemia work up No results for input(s): VITAMINB12, FOLATE, FERRITIN, TIBC, IRON, RETICCTPCT in the last 72  hours. Urinalysis    Component Value Date/Time   COLORURINE YELLOW 09/27/2018 2142   APPEARANCEUR CLEAR 09/27/2018 2142   LABSPEC 1.012 09/27/2018 2142   PHURINE 6.0 09/27/2018 2142   GLUCOSEU NEGATIVE 09/27/2018 2142   HGBUR NEGATIVE 09/27/2018 2142   BILIRUBINUR NEGATIVE 09/27/2018 2142   KETONESUR NEGATIVE 09/27/2018 2142   PROTEINUR NEGATIVE 09/27/2018 2142   NITRITE NEGATIVE 09/27/2018 2142   LEUKOCYTESUR NEGATIVE 09/27/2018 2142   Sepsis Labs Invalid input(s): PROCALCITONIN,  WBC,  LACTICIDVEN Microbiology No results found for this or  any previous visit (from the past 240 hour(s)).   Time coordinating discharge: <30 minutes  SIGNED:   Eddie North, MD  [facility] 09/29/2018, 2:25 PM Pager   If 7PM-7AM, please contact night-coverage [email or web address] Password [email or web address]

## 2018-09-29 NOTE — Procedures (Signed)
ELECTROENCEPHALOGRAM REPORT   Patient: Carrie Warner       Room #: 1440 EEG No. ID: [id number] Age: 72 y.o.        Sex: female Referring Physician: Dhungel Report Date:  09/29/2018        Interpreting Physician: Thana Farr  History: Keyri Caputo is an 72 y.o. female with syncope evaluated to rule out seizure  Medications:  ASA, Lipitor, Vitamin D, Zetia, Lasix, Insulin, Cozaar  Conditions of Recording:  This is a 21 channel routine scalp EEG performed with bipolar and monopolar montages arranged in accordance to the international 10/20 system of electrode placement. One channel was dedicated to EKG recording.  The patient is in the awake and drowsy states.  Description:  The waking background activity consists of a low voltage, symmetrical, fairly well organized, 8 Hz alpha activity, seen from the parieto-occipital and posterior temporal regions.  Low voltage fast activity, poorly organized, is seen anteriorly and is at times superimposed on more posterior regions.  A mixture of theta and alpha rhythms are seen from the central and temporal regions. The patient drowses with slowing to irregular, low voltage theta and beta activity.   Stage II sleep is not obtained. No epileptiform activity is noted.   Hyperventilation and intermittent photic stimulation were not performed.   IMPRESSION: Normal electroencephalogram, awake and drowsy. There are no focal lateralizing or epileptiform features.   Thana Farr, MD Neurology [phone] 09/29/2018, 11:10 AM

## 2018-09-29 NOTE — Progress Notes (Signed)
EEG completed; results pending.    

## 2018-09-29 NOTE — Progress Notes (Signed)
Patient and her daughter given discharge, follow up, and medication instructions, verbalized understanding, IV and telemetry monitor removed, personal belongings with patient, family to transport home  

## 2018-09-29 NOTE — Discharge Instructions (Signed)
Syncope Syncope is when you lose temporarily pass out (faint). Signs that you may be about to pass out include:  Feeling dizzy or light-headed.  Feeling sick to your stomach (nauseous).  Seeing all white or all black.  Having cold, clammy skin.  If you passed out, get help right away. Call your local emergency services (911 in the [place] Do not drive yourself to the hospital. Follow these instructions at home: Pay attention to any changes in your symptoms. Take these actions to help with your condition:  Have someone stay with you until you feel stable.  Do not drive, use machinery, or play sports until your doctor says it is okay.  Keep all follow-up visits as told by your doctor. This is important.  If you start to feel like you might pass out, lie down right away and raise (elevate) your feet above the level of your heart. Breathe deeply and steadily. Wait until all of the symptoms are gone.  Drink enough fluid to keep your pee (urine) clear or pale yellow.  If you are taking blood pressure or heart medicine, get up slowly and spend many minutes getting ready to sit and then stand. This can help with dizziness.  Take over-the-counter and prescription medicines only as told by your doctor.  Get help right away if:  You have a very bad headache.  You have unusual pain in your chest, tummy, or back.  You are bleeding from your mouth or rectum.  You have black or tarry poop (stool).  You have a very fast or uneven heartbeat (palpitations).  It hurts to breathe.  You pass out once or more than once.  You have jerky movements that you cannot control (seizure).  You are confused.  You have trouble walking.  You are very weak.  You have vision problems. These symptoms may be an emergency. Do not wait to see if the symptoms will go away. Get medical help right away. Call your local emergency services (911 in the [place] Do not drive yourself to the hospital. This  information is not intended to replace advice given to you by your health care provider. Make sure you discuss any questions you have with your health care provider. Document Released: 05/13/2008 Document Revised: 05/02/2016 Document Reviewed: 08/09/2015 Elsevier Interactive Patient Education  2018 [employer]  

## 2018-09-30 ENCOUNTER — Other Ambulatory Visit: Payer: Self-pay | Admitting: Cardiology

## 2018-09-30 DIAGNOSIS — R55 Syncope and collapse: Secondary | ICD-10-CM

## 2018-11-27 ENCOUNTER — Telehealth: Payer: Self-pay | Admitting: Internal Medicine

## 2018-11-27 NOTE — Telephone Encounter (Signed)
Returned call [facility]. Spoke with a Pharmacist. They are needing to verify the patients medications. Verified the medication and dosages that are written by Dr.Hilty. Adv the pharmacist that the non-cardiac meds will need to be verified by the pt pcp. They were also wanting to confirm non-cardiac dx. Adv the pharmacist that they will need to contact the pt pcp.

## 2018-11-27 NOTE — Telephone Encounter (Signed)
New patient   Need to contact so that  can do a comparison of medications. Please call.

## 2018-11-30 ENCOUNTER — Other Ambulatory Visit: Payer: Self-pay | Admitting: Internal Medicine

## 2018-12-21 ENCOUNTER — Ambulatory Visit: Payer: Medicare HMO | Admitting: Nurse Practitioner

## 2019-02-03 LAB — LIPID PANEL

## 2019-04-12 ENCOUNTER — Telehealth: Payer: Self-pay | Admitting: Physician Assistant

## 2019-04-12 NOTE — Telephone Encounter (Signed)
Patient has moved to [place].

## 2019-11-24 IMAGING — CT CT HEAD W/O CM
3 series · 15 of 46 positions shown, 18 images · non-contrast
Comparison: 09/09/2018

CLINICAL DATA: Altered level of consciousness.  Headache.

EXAM:
CT HEAD WITHOUT CONTRAST
TECHNIQUE: Contiguous axial images were obtained from the base of the skull
through the vertex without intravenous contrast.

[Series 2: head wo · axial · 0.40mm/px · z∈[-170,-50]mm · 9 of 29 slices shown, 12 images]
[im 3/29  brain]
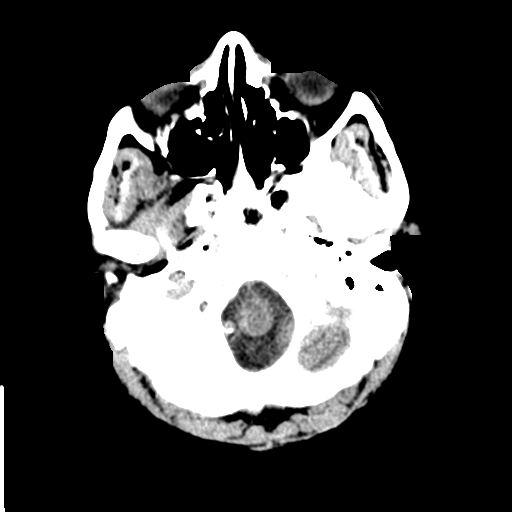
[im 3/29  bone]
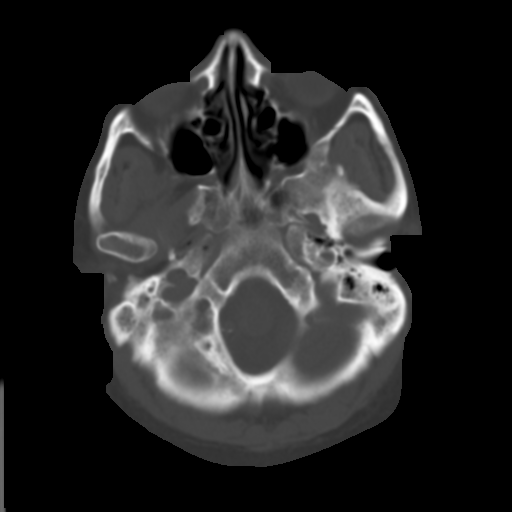
[im 6/29  brain]
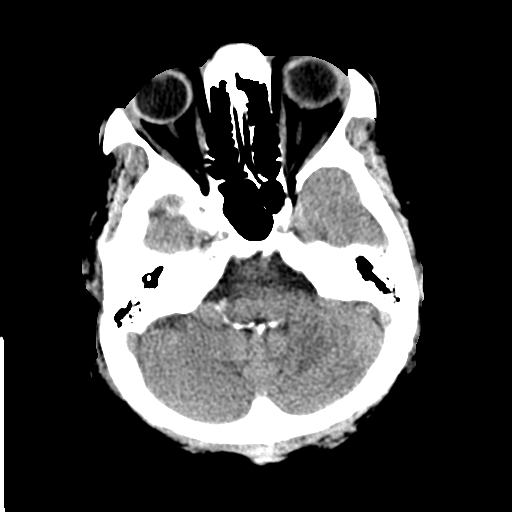
[im 9/29  brain]
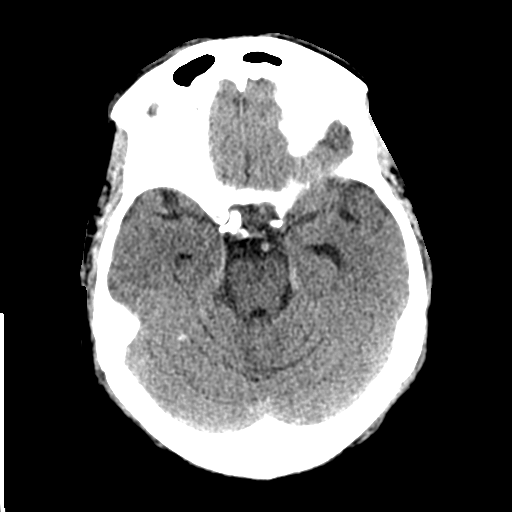
[im 12/29  brain]
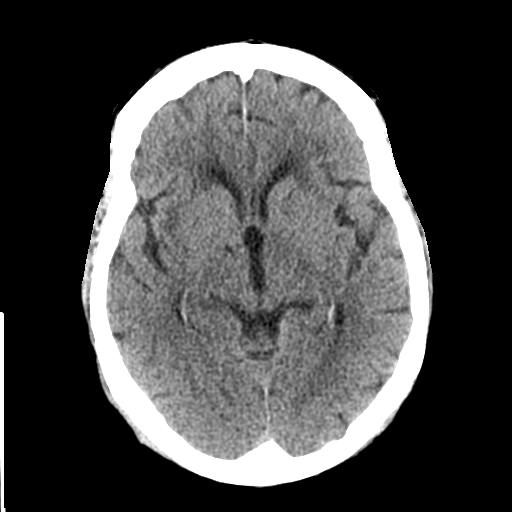
[im 15/29  brain]
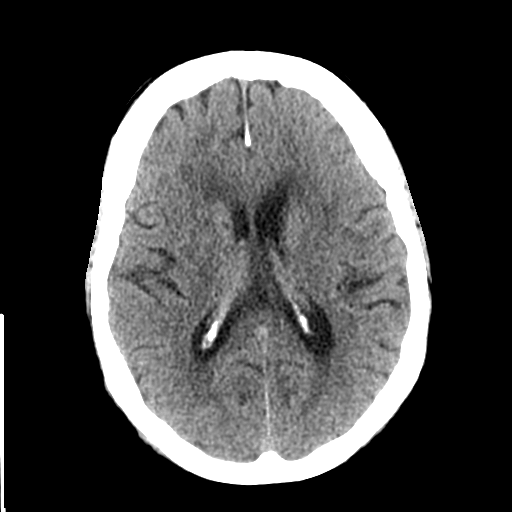
[im 15/29  bone]
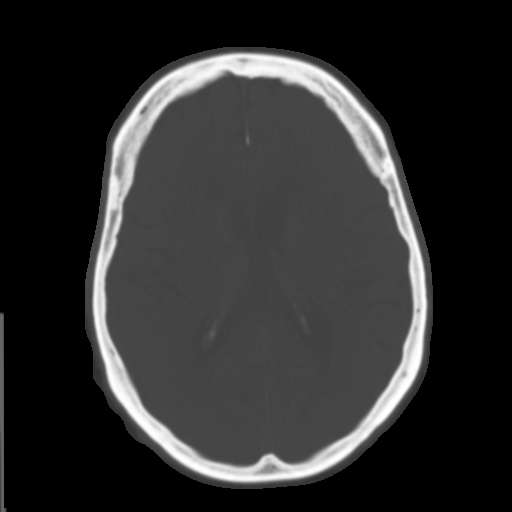
[im 18/29  brain]
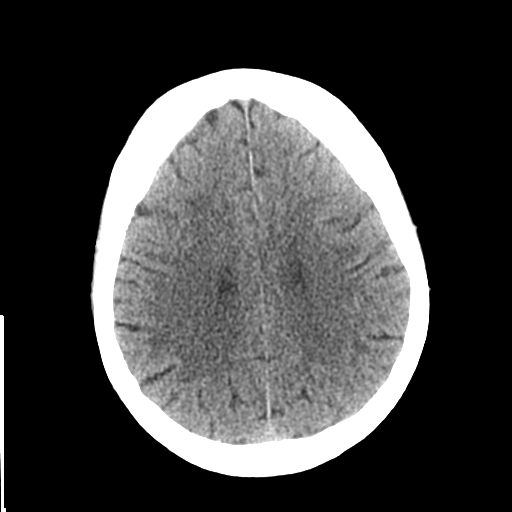
[im 21/29  brain]
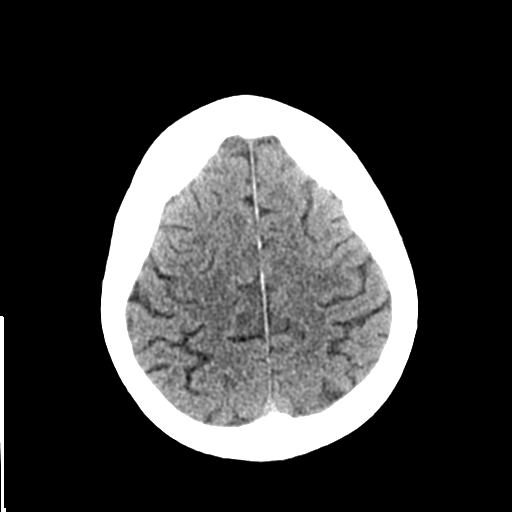
[im 24/29  brain]
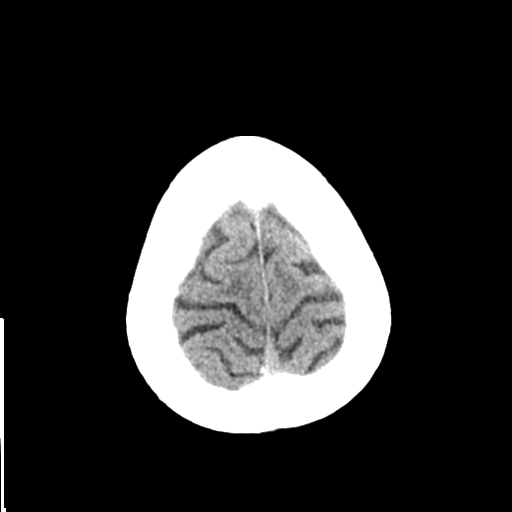
[im 27/29  brain]
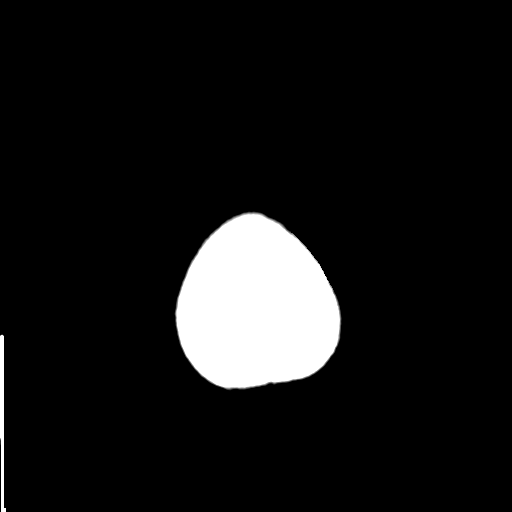
[im 27/29  bone]
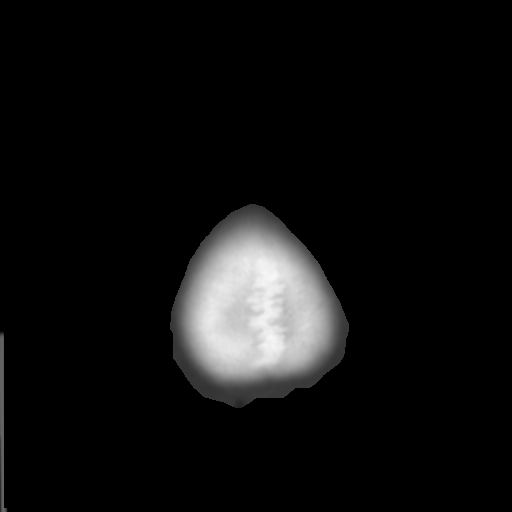

[Series 4: coronal soft tissue · coronal · 0.29mm/px · 3 of 70 slices shown]
[im 24/70  brain]
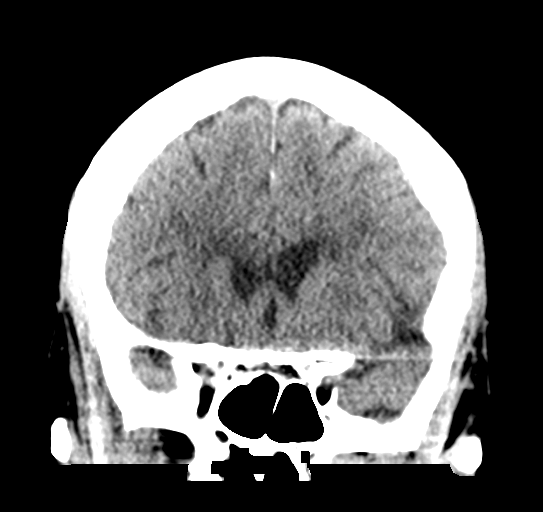
[im 31/70  brain]
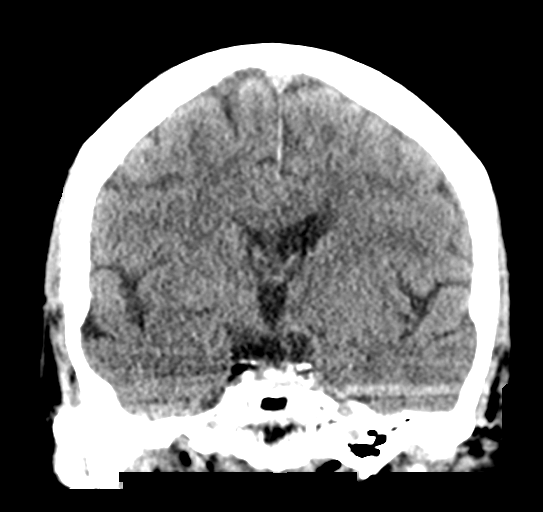
[im 39/70  brain]
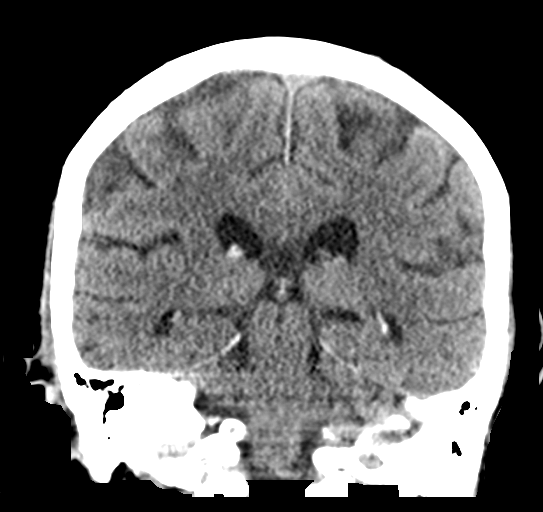

[Series 5: sagittal soft tissue · sagittal · 0.29mm/px · 3 of 62 slices shown]
[im 21/62  brain]
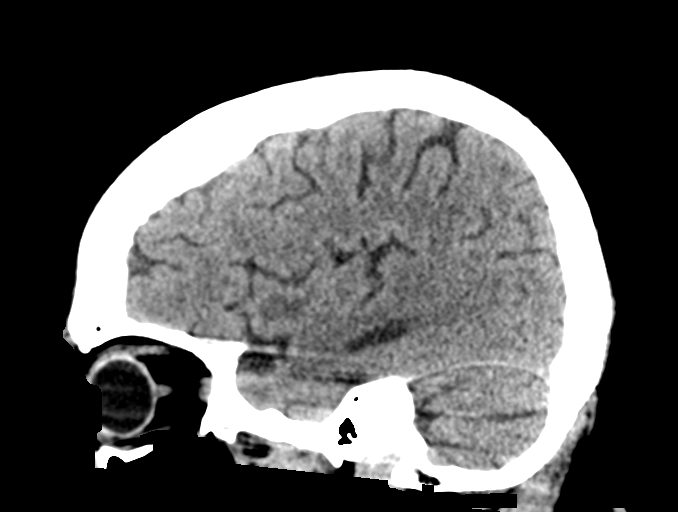
[im 31/62  brain]
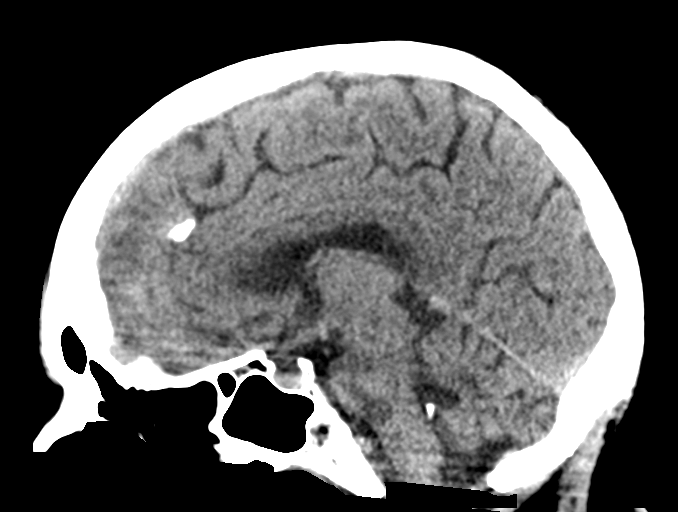
[im 41/62  brain]
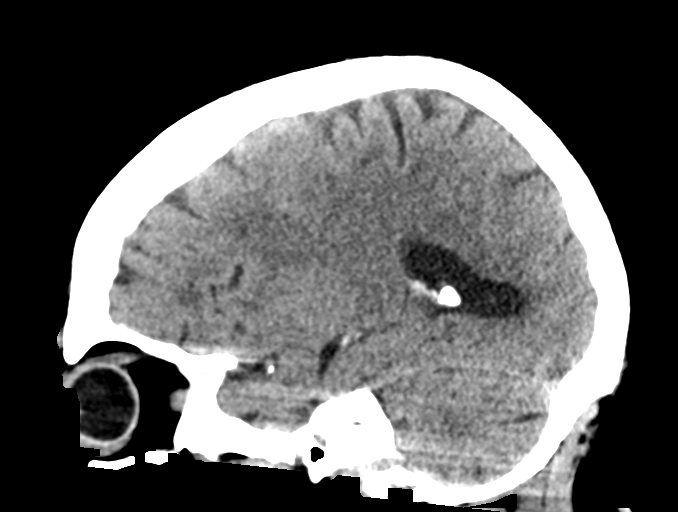

[15 of 46 positions shown; findings below may reference images not displayed]

FINDINGS: Brain: Mild chronic small vessel disease throughout the deep white
matter. No acute intracranial abnormality. Specifically, no
hemorrhage, hydrocephalus, mass lesion, acute infarction, or
significant intracranial injury.

Vascular: No hyperdense vessel or unexpected calcification.

Skull: No acute calvarial abnormality.

Sinuses/Orbits: Visualized paranasal sinuses and mastoids clear.
Orbital soft tissues unremarkable.

Other: None
IMPRESSION: Mild chronic small vessel disease throughout the deep white matter.
No acute intracranial abnormality.

## 2019-11-24 IMAGING — DX DG CHEST 1V PORT
1 series · 1 of 1 positions shown · non-contrast
Comparison: Radiographs September 09, 2018.

CLINICAL DATA: Weakness.

EXAM:
PORTABLE CHEST 1 VIEW

[chest ap]
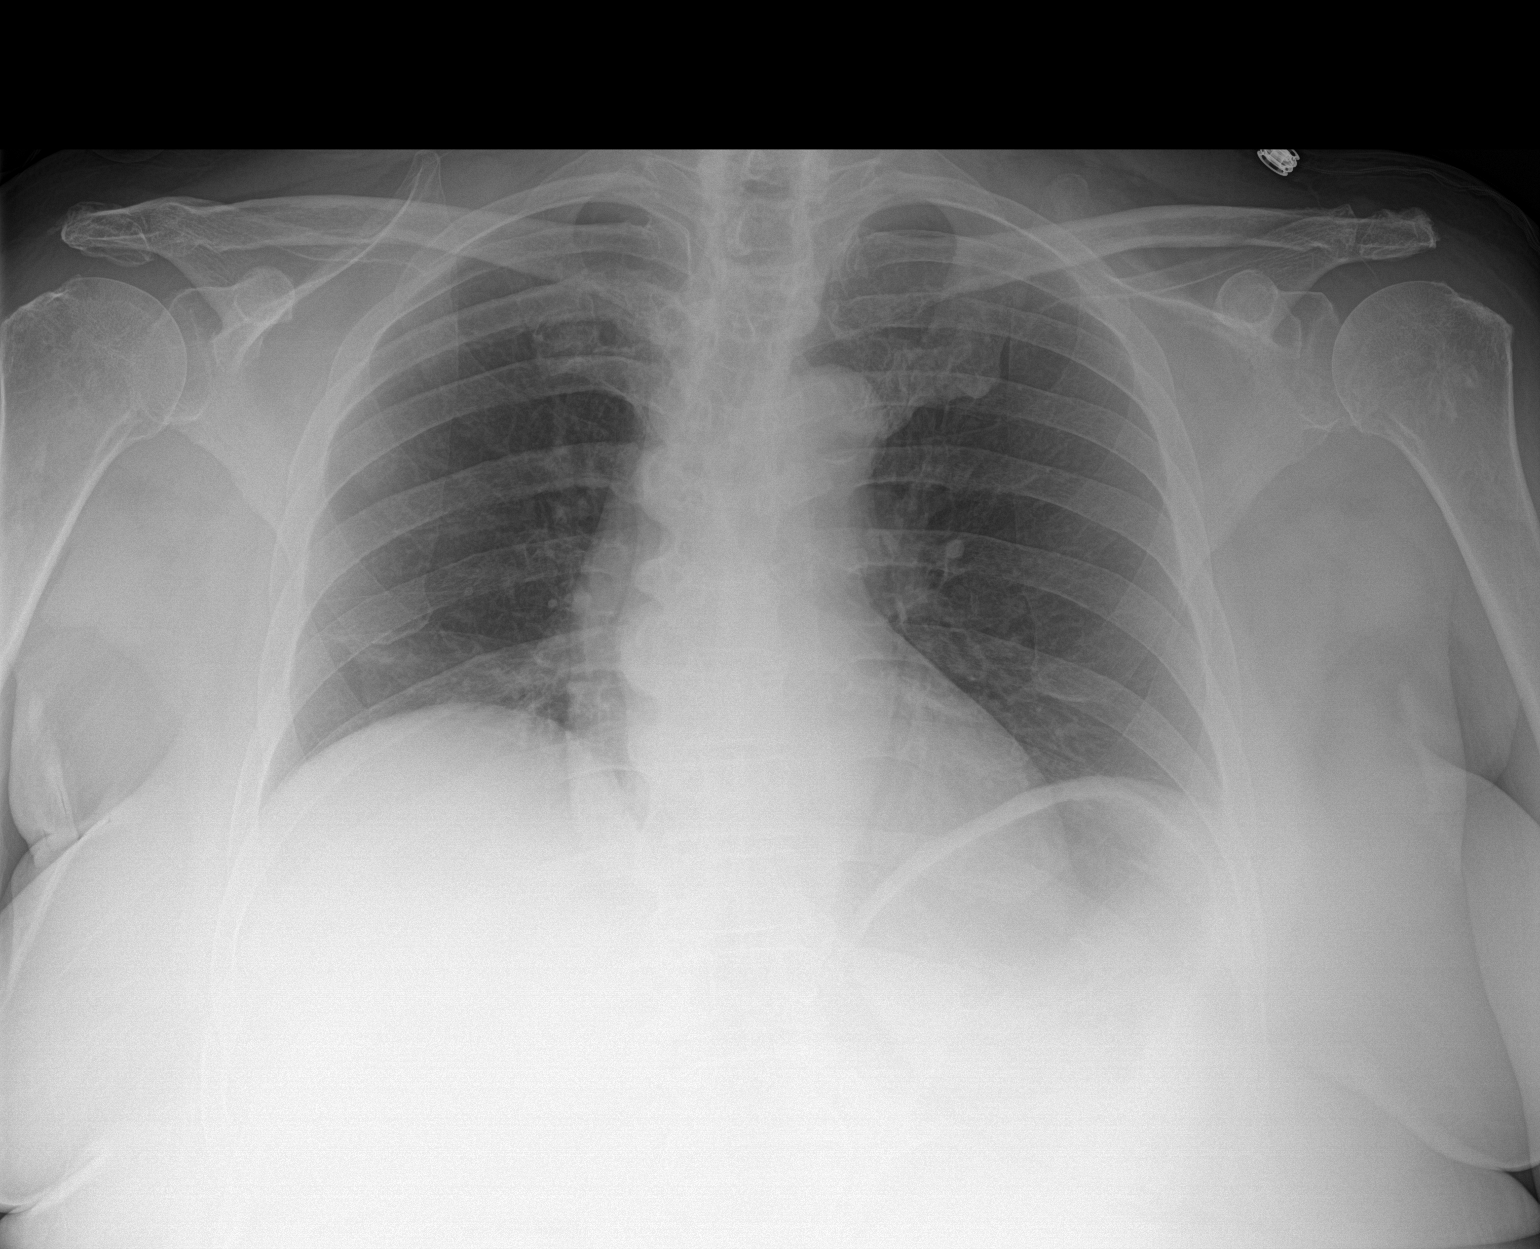

[1 of 1 positions shown; findings below may reference images not displayed]

FINDINGS: The heart size and mediastinal contours are within normal limits.
Both lungs are clear. The visualized skeletal structures are
unremarkable.
IMPRESSION: No active disease.

## 2020-07-26 ENCOUNTER — Telehealth: Payer: Self-pay | Admitting: Internal Medicine

## 2020-07-26 NOTE — Telephone Encounter (Signed)
    Dr. Lequita Halt called, he said pt moved to [facility] MD and he is the new cards. He would like to get copy of pt's heart cath and last visit notes. He gave fax# [phone]

## 2021-02-06 DEATH — deceased
